# Patient Record
Sex: Male | Born: 1955
Health system: Southern US, Community
[De-identification: ages and names within clinical notes are randomized; demographics above are authoritative.]

## PROBLEM LIST (undated history)

## (undated) DIAGNOSIS — M1120 Other chondrocalcinosis, unspecified site: Secondary | ICD-10-CM

## (undated) DIAGNOSIS — R0600 Dyspnea, unspecified: Secondary | ICD-10-CM

## (undated) DIAGNOSIS — I4891 Unspecified atrial fibrillation: Secondary | ICD-10-CM

## (undated) DIAGNOSIS — N529 Male erectile dysfunction, unspecified: Secondary | ICD-10-CM

## (undated) DIAGNOSIS — Z85850 Personal history of malignant neoplasm of thyroid: Secondary | ICD-10-CM

## (undated) DIAGNOSIS — E039 Hypothyroidism, unspecified: Secondary | ICD-10-CM

## (undated) DIAGNOSIS — Z87442 Personal history of urinary calculi: Secondary | ICD-10-CM

## (undated) DIAGNOSIS — K219 Gastro-esophageal reflux disease without esophagitis: Secondary | ICD-10-CM

## (undated) DIAGNOSIS — E785 Hyperlipidemia, unspecified: Secondary | ICD-10-CM

## (undated) DIAGNOSIS — C73 Malignant neoplasm of thyroid gland: Secondary | ICD-10-CM

## (undated) DIAGNOSIS — I639 Cerebral infarction, unspecified: Secondary | ICD-10-CM

## (undated) DIAGNOSIS — R519 Headache, unspecified: Secondary | ICD-10-CM

## (undated) HISTORY — PX: JOINT REPLACEMENT: SHX530

## (undated) HISTORY — PX: THYROIDECTOMY: SHX17

## (undated) HISTORY — DX: Hyperlipidemia, unspecified: E78.5

## (undated) HISTORY — DX: Unspecified atrial fibrillation: I48.91

## (undated) HISTORY — DX: Personal history of malignant neoplasm of thyroid: Z85.850

## (undated) HISTORY — DX: Hypothyroidism, unspecified: E03.9

## (undated) HISTORY — PX: KNEE SURGERY: SHX244

## (undated) HISTORY — DX: Cerebral infarction, unspecified: I63.9

## (undated) HISTORY — DX: Male erectile dysfunction, unspecified: N52.9

## (undated) HISTORY — DX: Other chondrocalcinosis, unspecified site: M11.20

---

## 2001-01-29 DIAGNOSIS — I639 Cerebral infarction, unspecified: Secondary | ICD-10-CM

## 2001-01-29 HISTORY — DX: Cerebral infarction, unspecified: I63.9

## 2001-02-14 ENCOUNTER — Inpatient Hospital Stay (HOSPITAL_COMMUNITY)
Admission: RE | Admit: 2001-02-14 | Discharge: 2001-03-07 | Payer: Self-pay | Admitting: Physical Medicine & Rehabilitation

## 2001-06-26 ENCOUNTER — Ambulatory Visit (HOSPITAL_COMMUNITY): Admission: RE | Admit: 2001-06-26 | Discharge: 2001-06-26 | Payer: Self-pay | Admitting: Cardiology

## 2001-09-17 ENCOUNTER — Encounter: Payer: Self-pay | Admitting: Cardiovascular Disease

## 2001-09-17 ENCOUNTER — Inpatient Hospital Stay (HOSPITAL_COMMUNITY): Admission: AD | Admit: 2001-09-17 | Discharge: 2001-09-24 | Payer: Self-pay | Admitting: Cardiovascular Disease

## 2001-09-22 HISTORY — PX: CARDIAC CATHETERIZATION: SHX172

## 2002-03-05 ENCOUNTER — Ambulatory Visit (HOSPITAL_COMMUNITY): Admission: RE | Admit: 2002-03-05 | Discharge: 2002-03-05 | Payer: Self-pay | Admitting: Cardiology

## 2002-06-12 ENCOUNTER — Ambulatory Visit (HOSPITAL_COMMUNITY): Admission: RE | Admit: 2002-06-12 | Discharge: 2002-06-12 | Payer: Self-pay | Admitting: Internal Medicine

## 2002-08-13 ENCOUNTER — Encounter
Admission: RE | Admit: 2002-08-13 | Discharge: 2002-11-11 | Payer: Self-pay | Admitting: Physical Medicine & Rehabilitation

## 2003-03-09 ENCOUNTER — Inpatient Hospital Stay (HOSPITAL_COMMUNITY): Admission: AD | Admit: 2003-03-09 | Discharge: 2003-03-13 | Payer: Self-pay | Admitting: Cardiovascular Disease

## 2005-03-09 ENCOUNTER — Ambulatory Visit: Payer: Self-pay | Admitting: Gastroenterology

## 2005-08-21 ENCOUNTER — Ambulatory Visit: Payer: Self-pay | Admitting: Family Medicine

## 2005-08-24 ENCOUNTER — Encounter: Admission: RE | Admit: 2005-08-24 | Discharge: 2005-08-24 | Payer: Self-pay | Admitting: Family Medicine

## 2006-03-29 ENCOUNTER — Ambulatory Visit: Payer: Self-pay | Admitting: Family Medicine

## 2006-04-12 ENCOUNTER — Ambulatory Visit: Payer: Self-pay | Admitting: Family Medicine

## 2006-04-26 ENCOUNTER — Ambulatory Visit: Payer: Self-pay | Admitting: Family Medicine

## 2006-05-15 ENCOUNTER — Ambulatory Visit: Payer: Self-pay | Admitting: Gastroenterology

## 2006-05-31 ENCOUNTER — Ambulatory Visit: Payer: Self-pay | Admitting: Gastroenterology

## 2006-05-31 ENCOUNTER — Ambulatory Visit (HOSPITAL_COMMUNITY): Admission: RE | Admit: 2006-05-31 | Discharge: 2006-05-31 | Payer: Self-pay | Admitting: Gastroenterology

## 2006-06-26 ENCOUNTER — Ambulatory Visit: Payer: Self-pay | Admitting: Gastroenterology

## 2007-01-03 ENCOUNTER — Ambulatory Visit: Payer: Self-pay | Admitting: Family Medicine

## 2007-07-24 ENCOUNTER — Ambulatory Visit: Payer: Self-pay | Admitting: Family Medicine

## 2007-12-08 ENCOUNTER — Ambulatory Visit: Payer: Self-pay | Admitting: Family Medicine

## 2008-03-24 ENCOUNTER — Ambulatory Visit: Payer: Self-pay | Admitting: Family Medicine

## 2008-05-10 ENCOUNTER — Ambulatory Visit: Payer: Self-pay | Admitting: Family Medicine

## 2008-07-05 ENCOUNTER — Ambulatory Visit: Payer: Self-pay | Admitting: Occupational Medicine

## 2008-07-05 DIAGNOSIS — I10 Essential (primary) hypertension: Secondary | ICD-10-CM

## 2008-07-05 DIAGNOSIS — L559 Sunburn, unspecified: Secondary | ICD-10-CM

## 2008-11-14 ENCOUNTER — Emergency Department (HOSPITAL_BASED_OUTPATIENT_CLINIC_OR_DEPARTMENT_OTHER): Admission: EM | Admit: 2008-11-14 | Discharge: 2008-11-14 | Payer: Self-pay | Admitting: Emergency Medicine

## 2008-11-14 ENCOUNTER — Ambulatory Visit: Payer: Self-pay | Admitting: Radiology

## 2008-12-08 ENCOUNTER — Ambulatory Visit: Payer: Self-pay | Admitting: Family Medicine

## 2009-07-25 ENCOUNTER — Ambulatory Visit: Payer: Self-pay | Admitting: Emergency Medicine

## 2009-07-25 DIAGNOSIS — S91009A Unspecified open wound, unspecified ankle, initial encounter: Secondary | ICD-10-CM

## 2009-07-25 DIAGNOSIS — S81009A Unspecified open wound, unspecified knee, initial encounter: Secondary | ICD-10-CM

## 2009-07-25 DIAGNOSIS — S81809A Unspecified open wound, unspecified lower leg, initial encounter: Secondary | ICD-10-CM | POA: Insufficient documentation

## 2010-01-18 ENCOUNTER — Ambulatory Visit: Payer: Self-pay | Admitting: Family Medicine

## 2010-02-28 NOTE — Assessment & Plan Note (Signed)
Summary: INJURY TO L LEG/WB   Vital Signs:  Patient Profile:   55 Years Old Male CC:      Larey Seat off a ladder this morning pain in left arm, abrasion to lower left leg Height:     62 inches Weight:      170 pounds O2 treatment:    Room Air Temp:     96.9 degrees F oral Pulse rhythm:   regular Resp:     16 per minute BP sitting:   103 / 67  (right arm) Cuff size:   large  Vitals Entered By: Emilio Math (July 25, 2009 8:08 AM)                  Current Allergies (reviewed today): ! PENICILLINHistory of Present Illness History from: patient & wife Chief Complaint: Larey Seat off a ladder this morning pain in left arm, abrasion to lower left leg History of Present Illness: 55yo WM with a history a stroke in his 40's and on Coumadin fell off a ladder at his house 1 hour ago.  He caught himself with both legs and both arms and was bleeding.  By the time he got to the office, the bleeding has mostly stopped but the left leg still oozing.  Painful abrasions on both legs and both arms.  No trauma to head or abdomen.  No CP, SOB, bruising.  His wife is here and speaks for him because his speech was affected by the stroke.  She states that he is baseline mentality.  He is able to express some words and actions and states that otherwise he is having no symptoms.  Pressure helped the bleeding stop.  Last Td in the past 5 years  REVIEW OF SYSTEMS Constitutional Symptoms      Denies fever, chills, night sweats, weight loss, weight gain, and fatigue.  Eyes       Denies change in vision, eye pain, eye discharge, glasses, contact lenses, and eye surgery. Ear/Nose/Throat/Mouth       Denies hearing loss/aids, change in hearing, ear pain, ear discharge, dizziness, frequent runny nose, frequent nose bleeds, sinus problems, sore throat, hoarseness, and tooth pain or bleeding.  Respiratory       Denies dry cough, productive cough, wheezing, shortness of breath, asthma, bronchitis, and emphysema/COPD.    Cardiovascular       Denies murmurs, chest pain, and tires easily with exhertion.    Gastrointestinal       Denies stomach pain, nausea/vomiting, diarrhea, constipation, blood in bowel movements, and indigestion. Genitourniary       Denies painful urination, kidney stones, and loss of urinary control. Neurological       Denies paralysis, seizures, and fainting/blackouts. Musculoskeletal       Complains of muscle pain, joint pain, joint stiffness, decreased range of motion, and redness.      Denies swelling, muscle weakness, and gout.      Comments: abrasion to lower left leg Skin       Denies bruising, unusual mles/lumps or sores, and hair/skin or nail changes.  Psych       Denies mood changes, temper/anger issues, anxiety/stress, speech problems, depression, and sleep problems.  Past History:  Social History: Last updated: 07/05/2008 Married Alcohol use-no Drug use-no Regular exercise-yes Chew Tobacco  Past Medical History: Reviewed history from 07/05/2008 and no changes required. Hypertension Blood Thinner Thyroid Cancer Stroke  Past Surgical History: Reviewed history from 07/05/2008 and no changes required. Knee Surgeries Thyroid Cancer  Family  History: Reviewed history from 07/05/2008 and no changes required. Family History Diabetes 1st degree relative Family History Hypertension Family History of Stroke F 1st degree relative <60  Social History: Reviewed history from 07/05/2008 and no changes required. Married Alcohol use-no Drug use-no Regular exercise-yes Chew Tobacco Physical Exam General appearance: well developed, well nourished, no acute distress Head: normocephalic, atraumatic Eyes: conjunctivae and lids normal Pupils: equal, round, reactive to light Neck: neck supple,  trachea midline, no masses Chest/Lungs: no rales, wheezes, or rhonchi bilateral, breath sounds equal without effort Heart: regular rate and  rhythm, no murmur Abdomen: soft,  non-tender without obvious organomegaly.  No bruising, no rebound or signs of intraabdominal bleeding Extremities: normal extremities, FROM, full strength Neurological: Speech is slurred (baseline per wife), right-sided weakness (baseline), able to walk with a limp, CN2-12 intact and no signs of intracranial bleeding Back: no tenderness over musculature Skin: Scattered abrasions and scratches on both shins and forearms, Left shin has 2cm laceration that goes 1cm deep to muscle, no signs of infection, no foreign bodies, very slight oozing of blood MSE: oriented to time, place, and person Assessment New Problems: WOUND, OPEN, LEG, WITHOUT COMPLICATION (ICD-891.0)   The patient and/or caregiver has been counseled thoroughly with regard to medications prescribed including dosage, schedule, interactions, rationale for use, and possible side effects and they verbalize understanding.  Diagnoses and expected course of recovery discussed and will return if not improved as expected or if the condition worsens. Patient and/or caregiver verbalized understanding.   PROCEDURE:  Suture Site: Left shin Size: 2cm Number of Lacerations: 1 Procedure: Discussed benefits and risks of procedure and verbal consent obtained.  Using sterile technique and local 1% lidocaine without epinephrine, cleansed wound with betadine followed by copious lavage with normal saline.  Wound carefully inspected for debris and foreign bodies; none found.  Wound closed with #3 , 4-0 interrupted nylon sutures.  Bacitracin and non-stick sterile dressing applied.  Wound precautions explained to patient.  Patient Instructions: 1)  Follow up in 10 days for suture removal 2)  Keep wound clean & dry for 48 hours 3)  After then, may shower but do not submerge in water until stitches are removed 4)  Tylenol only for pain 5)  If continued bleeding, return to clinic or speak to PCP 6)  Since you are on Coumadin, monitor for the first 48 hours  for changes of mental status, confusion, headache, abdominal pain. If any of these symptoms, call EMS or go immediately to the ER. 7)  Inform your PCP of what happened and caution with ladders, etc due to a history of stroke  Orders Added: 1)  New Patient Level IV [99204] 2)  Repair Superficial Wound(s) 2.5cm or <(scalp,neck,axillae,ext gent,trunk,extre) [12001]

## 2010-06-16 NOTE — Assessment & Plan Note (Signed)
[facility]                         GASTROENTEROLOGY OFFICE NOTE   NAME:Eric Ross, Eric Ross                    MRN:          [id number]  DATE:05/15/2006                            DOB:          July 26, 1955    Eric Ross has returned to set up colonoscopy.  He was seen over a  year ago for similar considerations and he decided not to proceed with  the procedure.  Dr. Allyson Sabal apparently felt that he should be on Lovenox  until the time of the procedure because of a history of a CVA and  cardiomyopathy.  Since his last visit, he has had no GI complaints.   PHYSICAL EXAMINATION:  VITAL SIGNS:  Pulse 56.  Blood pressure 110/60.  Weight 167.   IMPRESSION:  1. Functional constipation.  2. History of cardiomyopathy and paroxysmal atrial fibrillation- on      Coumadin.   RECOMMENDATIONS:  Proceed with screening colonoscopy.     Barbette Hair. Arlyce Dice, MD,FACG  Electronically Signed    RDK/MedQ  DD: 05/15/2006  DT: 05/15/2006  Job #: [id number]   cc:   Sharlot Gowda, M.D.  Nanetta Batty, M.D.

## 2010-06-16 NOTE — Op Note (Signed)
[place]  Patient:    Eric Ross, Eric Ross Visit Number: [id number] MRN: [id number]          Service Type: CAT Location: [facility] 2867 01 Attending Physician:  Norman Clay Dictated by:   Delrae Rend, M.D. Admit Date:  06/26/2001 Discharge Date: 06/26/2001   CC:         Ronnald Nian, M.D.  Runell Gess, M.D.   Operative Report  PROCEDURE PERFORMED:  Transesophageal echocardiogram.  INDICATION:  The patient is a 55 year old male with history of chronic atrial fibrillation and history of right-sided hemiparesis secondary to CVA in January of 2003, and with known history of left ventricular systolic dysfunction presents for evaluation of his atrial fibrillation. A transesophageal echocardiogram is being performed to evaluate for left atrial appendage clot and for possible for electrical cardioversion.  TECHNIQUE:  Under mild sedation using Versed and Demerol and using local anesthetic spray, a [employer] OmniPlane probe was easily introduced into the esophagus and transesophageal echocardiogram was performed. Adequate visualization was obtained of the left atrial appendage and also the left atrium. The patient tolerated the procedure well.  Left atrium:  The left atrium shows some mild left atrial enlargement. The left atrial appendage is well visualized. There is no evidence of left atrial appendage clot. The velocities in the left atrial appendage was greater than 20 cm per second.  Left ventricle:  The left ventricle shows moderate generalized hypokinesis. The ejection fraction is estimated around 35%.  Right atrium:  The right atrium is normal.  Right ventricle:  The right ventricle is normal.  Pericardium:  The pericardium is normal.  Mitral valve:  The mitral valve is normal. There is minimal mitral valve regurgitation.  Tricuspid valve:  The tricuspid valve is normal. There is minimal  tricuspid valve regurgitation.  Aortic valve:  The aortic valve is normal. There is no evidence of aortic stenosis or regurgitation.  Pulmonary valve:  The pulmonary valve is normal. There is trivial pulmonary regurgitation.  Intraatrial septum:  The intraatrial septum is intact. There is no evidence of PFO either by color Doppler or by double contrast injection.  Aorta:  The ascending arch and the thoracic aorta are normal. There is no complex plaque visualized.  FINAL IMPRESSION: 1. Moderate to markedly reduced left ventricular systolic dysfunction,    ejection fraction about 35% with global hypokinesis. No evidence of left    left ventricular apical thrombus. 2. Left atrium shows mild left enlargement with left atrial appendage showing    no evidence of left atrial appendage clot. 3. Minimal tricuspid, minimal mitral valve regurgitation. Dictated by:   Delrae Rend, M.D. Attending Physician:  Norman Clay DD:  06/26/01 TD:  06/27/01 Job: [id number]

## 2010-06-16 NOTE — Cardiovascular Report (Signed)
NAME:  SLOANE, JUNKIN                       ACCOUNT NO.:  [id number]   MEDICAL RECORD NO.:  [id number]                   PATIENT TYPE:  INP   LOCATION:  3727                                 FACILITY:  [facility]   PHYSICIAN:  Darlin Priestly, M.D.             DATE OF BIRTH:  1955-11-06   DATE OF PROCEDURE:  09/22/2001  DATE OF DISCHARGE:                              CARDIAC CATHETERIZATION   PROCEDURES:  1. Left heart catheterization.  2. Coronary angiography.  3. Left ventriculogram.   COMPLICATIONS:  None.   INDICATIONS:  The patient is a 55 year old male, patient of Dr. Nanetta Batty and Dr. Sharlot Gowda with a history of atrial fibrillation, history of  LV dysfunction with EF approximately 35% by echocardiogram.  The patient  recently complained of chest pain and is now referred for cardiac  catheterization while off of Coumadin to evaluate his coronary status and  then TEE cardioversion.   DESCRIPTION OF PROCEDURE:  After given informed written consent, the patient  was brought to the cardiac catheterization lab where his right and left  groins were shaved, prepped, and draped in the usual sterile fashion.  ECG  monitoring was established.  Using modified Seldinger technique, a #6 French  arterial access was inserted in the right femoral artery.  A 6 French  diagnostic catheter was then used to perform diagnostic angiography. This  reveals a large left main with no significant disease.   The LAD is a large vessel which coursed to the apex and gave rise to two  small diagonal branches. The LAD has no significant disease. The first  diagonal is a medium sized vessel which bifurcates distally and has no  significant disease. The second diagonal is a small vessel with no  significant disease.   Left circumflex is a large vessel which coursed in the AV groove and gave  rise to two obtuse marginal branches.  The AV groove circumflex has no  significant disease. The first  OM is a medium sized vessel which bifurcates  distally and has no significant disease. The second OM is a small vessel  which bifurcates in the mid section and has no significant disease. There is  noted to be an AV malformation with the AV groove circumflex.   RCA is a medium sized vessel which is dominant and gives rise to both the  PDA as well as the PLA.  There is no significant disease in the RCA, PDA, or posterolateral branch.   LEFT VENTRICULOGRAM:  Left ventriculogram reveals a moderately to severely  depressed EF at 25-30% with global hypokinesis.   HEMODYNAMICS:  Systemic arterial pressure 127/75, LV systemic pressure  125/8, LVEDP of 11.    CONCLUSION:  1. Essentially normal coronary arteries.  2. Arteriovenous malformation noted in the distal arteriovenous groove     circumflex distribution  3. Moderate to severely depressed left ventricular systolic function with  wall motion abnormalities as noted above.                                                 Darlin Priestly, M.D.    RHM/MEDQ  D:  09/22/2001  T:  09/23/2001  Job:  [id number]   cc:   Runell Gess, M.D.   Ronnald Nian, M.D.

## 2010-06-16 NOTE — Discharge Summary (Signed)
NAME:  Eric Ross, Eric Ross                       ACCOUNT NO.:  [id number]   MEDICAL RECORD NO.:  [id number]                   PATIENT TYPE:  INP   LOCATION:  3701                                 FACILITY:  [facility]   PHYSICIAN:  Nanetta Batty, M.D.                DATE OF BIRTH:  27-Apr-1955   DATE OF ADMISSION:  03/09/2003  DATE OF DISCHARGE:  03/13/2003                                 DISCHARGE SUMMARY   DISCHARGE DIAGNOSES:  1. Paroxysmal atrial fibrillation, TEE cardioversion on Tikosyn on this     admission.  2. Coumadin therapy.  3. History of a cerebrovascular accident in the past.  4. Cardiomyopathy with an ejection fraction of 35 to 40% by transesophageal     echocardiography.  5. Normal coronaries.   HOSPITAL COURSE:  [name] is a 55 year old male followed by Dr. Susann Givens  and Dr. Allyson Sabal.  He has apparently failed amiodarone in the past.  He had an  atrial flutter ablation by Dr. Clide Cliff in May of 2004.  He unfortunately had a  significant CVA in January of 2003 and is aphasic.  He was referred to Dr.  Macon Large in [place] for possible atrial fibrillation ablation.  Dr. Macon Large  suggested trying a different antiarrhythmic and the patient is admitted now  for a trial of Tikosyn.  The patient was admitted on March 09, 2003 and  started on Tikosyn which actually was not started until March 10, 2003.  He had been off amiodarone for some time.  The patient underwent TEE  cardioversion on March 12, 2003 by Dr. Jenne Campus.  As noted, his LV  function was essentially normal.  He is discharged on March 13, 2003 in  sinus rhythm.   DISCHARGE MEDICATIONS:  1. Coumadin 5 mg daily.  2. Synthroid 0.112 mg daily.  3. Lanoxin 0.125 mg daily.  4. Zocor 20 mg daily.  5. Tikosyn 500 mg b.i.d.  6. Altace 2.5 mg daily.  7. Coreg 12.5 mg b.i.d.   LABORATORY DATA:  EKG on admission shows atrial fibrillation, EKG after  cardioversion on March 12, 2003 shows sinus rhythm with a QTC of  462.  White count 6000, hemoglobin 14.6, hematocrit 43.1, platelets  152,000.  His INR on admission was 2.9, INR on March 12, 2003 was 2.2.  Sodium 138, potassium 4.1, BUN 10, creatinine 1.0.  Liver function tests  were essentially normal.  TSH 1.7.  Digoxin level 0.4.   DISPOSITION:  The patient was discharged in stable condition and will follow  up with Dr. Allyson Sabal.      Abelino Derrick, P.A.                      Nanetta Batty, M.D.    Lenard Lance  D:  04/24/2003  T:  04/24/2003  Job:  [id number]   cc:   Sharlot Gowda, M.D.  [address]  [place], [place] [address]  Fax: [phone]

## 2010-06-16 NOTE — Op Note (Signed)
NAME:  Eric Ross, Eric Ross                       ACCOUNT NO.:  [id number]   MEDICAL RECORD NO.:  [id number]                   PATIENT TYPE:  OIB   LOCATION:  2861                                 FACILITY:  [facility]   PHYSICIAN:  Cristy Hilts. Jacinto Halim, M.D.                  DATE OF BIRTH:  [birth year]   DATE OF PROCEDURE:  DATE OF DISCHARGE:                                 OPERATIVE REPORT   PROCEDURE PERFORMED:  Transesophageal echocardiogram.   INDICATIONS FOR PROCEDURE:  Mr. Wakefield is a 55 year old gentleman with a  history of non-ischemic cardiomyopathy, history of paroxysmal atrial  fibrillation, who has now recurrently gone back into atrial fibrillation.  He was brought to the TE suite for TE guided apical cardioversion. A TE has  been performed to evaluate for atrial appendage.   DATA:  Left atrium:  Left atrium shows moderate to large marked left atrial  enlargement.   Left ventricle:  The left ventricle shows mild left ventricular dilatation.  There is mild to moderate generalized hypokinesis.  There is anterior antral  septal hypokinesis. The ejection fraction was estimated at 40%.   Right atrium:  The right atrium is normal.   Right ventricle:  The right ventricle is normal.   Pericardium:  The pericardium is normal.   Mitral valve:  The mitral valve is normal.  There is trivial mitral valve  regurgitation.   __________ is normal.  There is trivial tricuspid regurgitation.   __________ is normal.   Left atrial appendage:  Left atrial appendage is very well visualized. The  velocity is greater than 80 cm per second.  There is no evidence of left  atrial appendage clot.   Intra-atrial septum:  The intra-atrial septum is intact.  No evidence of  impaired flow by color Doppler or by double contrast injection.   IMPRESSION:  1. Moderate decrease in left ventricular systolic function, ejection     fraction around 40% with generalized hypokinesis, anterior hypokinesis.  2. No  evidence of left atrial appendage clot.   RECOMMENDATIONS:  Will proceed with electrical cardioversion.   ELECTRICAL CARDIOVERSION:  Technique:  Under mild sedation, with the help of  the Department of Anesthesia, using 25 mg of intravenous Pentothal, deep  sedation was achieved and synchronized direct current  cardioversion was done using a biphasic defibrillator.  50 joules of  electrical activity was utilized for cardioversion and patient converted  from atrial flutter to sinus bradycardia.  He remained hemodynamically  stable and tolerated the procedure well.                                               Cristy Hilts. Jacinto Halim, M.D.    Pilar Plate  D:  03/05/2002  T:  03/06/2002  Job:  [id number]  cc:   Sharlot Gowda, M.D.  [place] [address] [place], [place] [address]  Fax: [phone]

## 2010-06-16 NOTE — Cardiovascular Report (Signed)
NAME:  Eric Ross, Eric Ross                       ACCOUNT NO.:  [id number]   MEDICAL RECORD NO.:  [id number]                   PATIENT TYPE:  OIB   LOCATION:  2028                                 FACILITY:  [facility]   PHYSICIAN:  Duke Salvia, M.D.               DATE OF BIRTH:  April 25, 1955   DATE OF PROCEDURE:  06/12/2002  DATE OF DISCHARGE:  06/12/2002                              CARDIAC CATHETERIZATION   PRE PROCEDURE DIAGNOSIS:  Atrial flutter.   POSTPROCEDURE DIAGNOSIS:  Counterclockwise/clockwise flutter/other atypical  flutters and atrial fibrillation.   PROCEDURES PERFORMED:  1. Invasive electrophysiological study.  2. Arrhythmia mapping.  3. Radiofrequency catheter ablation.   CARDIOLOGIST:  Duke Salvia, M.D.   DESCRIPTION OF PROCEDURE:  Following the obtainment of informed consent the  patient was brought to the electrophysiology laboratory and placed on the  fluoroscopic table in the supine position.  After routine prep and drape the  cardiac catheterization was performed with local anesthesia and conscious  sedation.  Noninvasive blood pressure monitoring, transcutaneous oxygen  saturation monitoring and end-tidal CO2 monitoring were performed  continuously throughout the procedure.   Following the procedure the catheters were remove.  Hemostasis was obtained  and the patient was transferred to the floor in stable condition.   CATHETERS:  1. A 5-French quadripolar catheter was inserted via the left femoral vein to     the AV junction.  2. A 7-French Duodecapolar was inserted via the right femoral vein to the     tricuspid annulus.  3. A 6-French octapolar catheter was inserted via the right femoral vein to     the coronary sinus.  4. A 7-French 8 mm deflectable tip catheter was inserted via the right     femoral vein to mapping sites in the posterior septal space.   SURFACE LEADS:  Surface leads 1, aVF, and V1 were monitored continuously  throughout the  procedure.   STIMULATION PROTOCOL:  Following insertion of the catheters a stimulation  protocol included incremental intra-atrial pacing and  incremental  ventricular pacing.  Single atrial extrastimuli at a paced cycle length of  600 msec.   RESULTS:   SURFACE ELECTROCARDIOGRAM AND BASIC INTERVALS:  Rhythm is sinus initial and  sinus final.  Cycle length 1448 msec initial and 1067 msec final.  P-R interval is 184 msec initial and 178 msec final.  QRS duration is 104 msec initial and 109 msec final.  Q-T intervals 508 msec initial and final.  P-wave duration is 131 msec initial and 133 msec final.  Bundle branch block was absent and absent.  Preexcitation was absent and absent.   A-H interval is 104 secondary initial and 97 msec final.  H-V interval is 36 msec initial and 43 msec final.  His bundle branch was N/M initial and 12 msec final.   A-V NODAL FUNCTION:  The A-V Wenckebach cycle length was 550 msec in the  absence/__________.  V-A conduction was dissociated.  A-V nodal conduction was continuous.   ACCESSORY PATHWAY FUNCTION:  No evidence of an accessory pathway was  identified.   Arrhythmia was induced.  Two different isthmus-dependent flutters were  identified; one clockwise and one counterclockwise.  Both with a cycle  length of about 310 msec, which is 50 msec longer than his clinical  tachycardia.   MAPPING:  Mapping demonstrated post pacing intervals of tachycardic cycle  length of +10-20 msec from the isthmus and greater than 80 msec from the  left side of the heart.   Alternating atrial activation sequences were also seen consistent with  atrial fibrillation and breakdown of the primary circuits.  In addition, the  atrial flutter behavior is quite unusual in that it would start and stop  spontaneously.   RADIOFREQUENCY ABLATION:  Radiofrequency energy:  A total of 28 minutes 8  seconds of radio wave energy was applied at sites between the tricuspid   annulus in the IVC.  Initial application was placed at about 6 o'clock, a  line was drawn at 5 o'clock and ultimately a line at 7 o'clock was  successful in interrupting transisthmus conduction.   FLUOROSCOPY TIME:  A total of 20 minutes of fluoroscopy time was utilized at  15 frames per second.   IMPRESSION:  1. Normal sinus function apart from bradycardia.  2. Abnormal atrial function manifested by:     a. Counterclockwise and clockwise flutter, and other atrial arrhythmias.  3. Normal A-V nodal function.  4. Normal His Purkinje system function.  5. No accessory pathways.  6. Normal ventricular response to programmed stimulation.   SUMMARY AND CONCLUSION:  The results of electrophysiological testing  identified isthmus-dependent clockwise and counterclockwise flutters.  The  substrate of which was eliminated by radio wave energy applied between the  tricuspid annulus and the inferior vena cava resulting in a bidirectional  isthmus block.  However, there were other atrial arrhythmias seen at mid and  related to either breakdown of the primary arrhythmia or related to rhythm  the patient initially presented with, which was atrial fibrillation.  Trigger ablation might ultimately be needed for the patient for control of  the patient's symptoms as he has failed amiodarone from a triggering point  of view to date; although, the amiodarone may have affected his substrate  such that the atrial flutter ablation may have a significant impact on  decreasing his arrhythmia burden.    RECOMMENDATIONS:  1. Observe during the day.  2. Discharge and maintain his Coumadin and his amiodarone as previously     scheduled.  3. We will see again as needed per Dr. Allyson Sabal.                                                 Duke Salvia, M.D.    SCK/MEDQ  D:  06/12/2002  T:  06/13/2002  Job:  [id number]   cc:   Nanetta Batty, M.D. [address], [address]  [place]  [place] [address]  Fax:  [phone]   Electrophysiology Laboratory

## 2010-06-16 NOTE — Cardiovascular Report (Signed)
[facility]  Patient:    Eric Ross, Eric Ross Visit Number: [id number] MRN: [id number]          Service Type: CAT Location: [facility] 2867 01 Attending Physician:  Norman Clay Dictated by:   Delrae Rend, M.D. Proc. Date: 06/26/01 Admit Date:  06/26/2001 Discharge Date: 06/26/2001   CC:         Ronnald Nian, M.D.  Runell Gess, M.D.   Cardiac Catheterization  PROCEDURE:  Direct current cardioversion.  CARDIOLOGIST:  Delrae Rend, M.D.  INDICATIONS:  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  He underwent transesophageal echocardiogram for evidence of left atrial appendage clot.  As he had no evidence of left atrial appendage clot, a direct current cardioversion was attempted to convert atrial fibrillation to sinus rhythm. Also, the patient has severe left ventricular systolic dysfunction.  This would definitely be beneficial for his cardiac output.  TECHNIQUE:  With the help of the department of anesthesia under deep sedation with etomidate and also pentothal IV injection, direct current cardioversion was attempted at 200 joules.  The patient converted to sinus rhythm for a brief period and reverted back to atrial fibrillation.  A second attempt at 300 joules performed x 2.  The patient with the second attempt did not convert to sinus rhythm, remained in atrial fibrillation, but with the third attempt, he did convert to sinus rhythm for a very brief period and then reverted back to atrial fibrillation.  IMPRESSION:  Chronic atrial fibrillation with unsuccessful direct current cardioversion.  The patient very transiently converted to sinus rhythm; however, he reverted back to atrial fibrillation.  RECOMMENDATIONS:  As patient easily reverts back into atrial fibrillation, would recommend antiarrhythmic therapy trial and then repeat  attempt at electrical cardioversion in four to six weeks.  There is no evidence of left atrial appendage clot; hence, direct current cardioversion can be attempted without a TEE as long as his INR is with the therapeutic range.  The patient will follow up with Dr. Nanetta Batty. Dictated by:   Delrae Rend, M.D. Attending Physician:  Norman Clay DD:  06/26/01 TD:  06/27/01 Job: [id number]

## 2010-06-16 NOTE — Cardiovascular Report (Signed)
NAME:  Eric Ross, Eric Ross                       ACCOUNT NO.:  [id number]   MEDICAL RECORD NO.:  [id number]                   PATIENT TYPE:  INP   LOCATION:  3701                                 FACILITY:  [facility]   PHYSICIAN:  Darlin Priestly, M.D.             DATE OF BIRTH:  September 25, 1955   DATE OF PROCEDURE:  03/12/2003  DATE OF DISCHARGE:                              CARDIAC CATHETERIZATION   PROCEDURE:  Transesophageal echocardiogram guided DC cardioversion.   CARDIOLOGIST:  Darlin Priestly, M.D.   COMPLICATIONS:  None.   INDICATIONS:  [name] is a 55 year old male, patient of Dr. Sharlot Gowda and Dr. Nanetta Batty, with a history of recurrent atrial  fibrillation and history of CVA with expressive aphasia and right-sided  weakness who has been on multiple antiarrhythmic medications.  However, he  continues to have episodes of a-fib.  He was recently admitted for Tikosyn  loading and is now referred for repeat DC cardioversion on dofetilide.   DESCRIPTION OF PROCEDURE:  After obtaining informed written consent the  patient was brought to the endoscopy suite in the fasting state.  He  underwent a successful and uncomplicated transesophageal echocardiogram.  The LV appeared to be normal size with moderately depressed left ventricular  systolic function, estimated to probably 35-40%.  There appears to be little  hypokinesis.  Structurally normal aortic valve.  Structurally normal mitral  valve with trivial mitral regurg.  Structurally normal tricuspid valve with  a trivial tricuspid regurg.  No intracardiac mass with thrombi noted.  No  evidence of patent PFO by color or contrast echo.  Normal descending  thoracic aorta.   Following the TEE the patient then received 100 mg of Pentothal by  anesthesia.  The patient then underwent successful and uncomplicated DC  cardioversion at 150 joules with the biphasic defibrillator from atrial  fibrillation back to sinus bradycardia.   He remained hemodynamically stable.   The patient awoke in satisfactory condition and was transferred to the  recovery room.   CONCLUSION:  1. Successful transesophageal echocardiogram with findings noted above.  2. Successful cardioversion from atrial fibrillation to sinus bradycardia.                                               Darlin Priestly, M.D.    RHM/MEDQ  D:  03/12/2003  T:  03/13/2003  Job:  [id number]   cc:   Sharlot Gowda, M.D.  [address]  [place], [place] [address]  Fax: [phone]   Nanetta Batty, M.D.  Fax: [phone]

## 2010-06-16 NOTE — Discharge Summary (Signed)
   NAME:  Eric Ross, Eric Ross                       ACCOUNT NO.:  [id number]   MEDICAL RECORD NO.:  [id number]                   PATIENT TYPE:  OIB   LOCATION:  2028                                 FACILITY:  [facility]   PHYSICIAN:  Duke Salvia, M.D.               DATE OF BIRTH:  1955-05-14   DATE OF ADMISSION:  06/12/2002  DATE OF DISCHARGE:  06/12/2002                                 DISCHARGE SUMMARY   PRIMARY DIAGNOSIS:  Atrial flutter.   SECONDARY DIAGNOSIS:  1. History of atrial fibrillation on Amiodarone therapy.  2. Cardiomyopathy.  3. Prior cerebrovascular accident and disabled secondary to cerebrovascular     accident.   HISTORY OF PRESENT ILLNESS:  This is a 55 year old gentleman who is aphasic,  who presented with a stroke in the context of atrial arrhythmia in January  2003 and left ventricular dysfunction.  He has made significant recovery,  but has persistent aphasia. He is now fairly stable.  He has undergone  cardioversion on a couple of occasions after the second of which he was  apparently put on Amiodarone.  He continued to have breakthrough after his  most recent cardioversion.  The patient was admitted for an atrial flutter  ablation.   The patient underwent successful atrial flutter ablation.  The patient  underwent successful ablation of transisthmus conduction showing conduction  consistent with atrial flutter, also a number of multiple other atrial  arrhythmias. The patient was to continue on continue on Coumadin and  Amiodarone therapy. His heart rate post procedure was 46.  Blood pressure 90-  100/50's.  He was discharged to home in stable condition on all his previous  medication. He was to take Tylenol 1-2 tablets every 4-6 hours as needed. No  heavy lifting or strenuous activity for four days.   DIET:  Low fat, low salt, low cholesterol diet.   He is to call if he develops a lump or any drainage in his groin. An  appointment was scheduled with Dr.  Graciela Husbands for June 25 at 12 noon.   DISCHARGE MEDICATIONS:  He was discharged on Coumadin 5 mg nightly, Coreg  6.25 b.i.d.,  Digoxin 0.125 daily,  Synthroid 150 mcg daily, Altace 2.5  daily, Zocor 20 nightly and Cordarone 200 mg daily.      Chinita Pester, C.R.N.P. [facility]                 Duke Salvia, M.D.    DS/MEDQ  D:  06/12/2002  T:  06/13/2002  Job:  [id number]   cc:   Nanetta Batty, M.D.  [address], [address]  [place]  [place] [address]  Fax: [phone]

## 2010-06-16 NOTE — H&P (Signed)
NAME:  BENJI, POYNTER                       ACCOUNT NO.:  [id number]   MEDICAL RECORD NO.:  [id number]                   PATIENT TYPE:  INP   LOCATION:  3701                                 FACILITY:  [facility]   PHYSICIAN:  Nanetta Batty, M.D.                DATE OF BIRTH:  12/02/1955   DATE OF ADMISSION:  03/09/2003  DATE OF DISCHARGE:                                HISTORY & PHYSICAL   HISTORY OF PRESENT ILLNESS:  Mr. Marczak is a 55 year old male followed by  Dr. Susann Givens and Dr. Allyson Sabal with a history of recurrent atrial fibrillation.  He apparently has failed amiodarone in the past.  He had an atrial flutter  ablation by Dr. Graciela Husbands in May 2004.  He has had a prior CVA in January 2003.  He is on chronic Coumadin therapy.  Dr. Allyson Sabal recently asked Dr. Val Riles at [facility] to evaluate  him for possible atrial flutter ablation. Dr  .Macon Large has suggested we try a second drug.  His choice was dofetilide.  If  the patient does not hold sinus rhythm on dofetilide, then he would see him  back for possible atrial fibrillation ablation.  Symptomatically, the  patient has done fairly well.  He denies any shortness of breath.   PAST MEDICAL HISTORY:  1. CVA in January 2003 that left him aphasic and some right upper extremity     weakness.  2. His LV function has been depressed in the past; his EF was 25 to 30% at     catheterization in August 2003, but an echocardiogram done in October     2003 had shown his EF to be 50 to 60%.  He did have normal coronaries at     catheterization in August 2003.  3. Treated hyperlipidemia.  4. History of thyroid cancer and had remote thyroid surgery in the 1980s.  5. DJD of knees and had knee surgery in the past; I believe he used to ride     [place].   CURRENT MEDICATIONS:  1. Lanoxin 0.125 mg a day.  2. Coumadin 5 mg a day.  3. Synthroid 0.125 mg a day.  4. Zocor 20 mg a day.  5. Coreg 12.5 mg 1-1/2 tablets b.i.d.  6. Altace 2.5 mg  b.i.d.   ALLERGIES:  PENICILLIN.   SOCIAL HISTORY:  He is married.  He does not smoke.  He uses alcohol  occasionally.   FAMILY HISTORY:  His father died at 52 with lung disease.  His mother died  at 43 from thyroid cancer.  He has one brother, age 25, that had a stroke;  one brother at 15 with no significant coronary artery disease or stroke  history.  He has one sister with history of lung cancer.   REVIEW OF SYSTEMS:  Essentially unremarkable except as noted above.  He  denies any orthopnea or PND or unusual dyspnea.  He does have right-sided  weakness from stroke.  Review of Systems is otherwise unremarkable.   PHYSICAL EXAMINATION:  VITAL SIGNS:  Blood pressure 96/48, heart rate 76,  respirations 16.  GENERAL:  Well-developed, well-nourished, thin male in no acute distress.  HEENT:  Normocephalic.  Extraocular movements intact.  Sclerae anicteric.  Lids and conjunctivae  within normal limits.[name]  NECK:  Without bruit and without JVD.  CHEST:  Clear to auscultation and percussion.  CARDIAC:  Irregularly irregular rhythm without murmur,rub, or gallop.  ABDOMEN:  Nontender.  No hepatosplenomegaly is appreciated.  EXTREMITIES:  Without edema.  Distal pulses are 3+/4 bilaterally.  NEUROLOGIC:  Grossly intact.  He is awake, alert, oriented, and cooperative.  He does have aphasia but responds positively or negatively to questions.   IMPRESSION:  1. Recurrent atrial fibrillation, failed on amiodarone.  2. History of cerebrovascular accident in January 2003 with aphasia, mild     right hemiparesis.  3. Question of cardiomyopathy.  Ejection fraction was 25 to 30% on     catheterization in August 2003; echocardiogram in October 2003 shows     ejection fraction to be improved at 50 to 60%.  4. Normal coronaries at catheterization August 2003.  5. Hyperlipidemia.  The patient is on Zocor.  6. Treated hypothyroidism with remote history of thyroid cancer and surgery.   PLAN:  The patient  was admitted by Dr. Allyson Sabal and will be started on Tikosyn.      Abelino Derrick, P.A.                      Nanetta Batty, M.D.    Lenard Lance  D:  03/09/2003  T:  03/09/2003  Job:  [id number]

## 2010-06-16 NOTE — Op Note (Signed)
   NAME:  DEMERE, DOTZLER                       ACCOUNT NO.:  [id number]   MEDICAL RECORD NO.:  [id number]                   PATIENT TYPE:  INP   LOCATION:  3727                                 FACILITY:  [facility]   PHYSICIAN:  Cristy Hilts. Jacinto Halim, M.D.                  DATE OF BIRTH:  Jun 24, 1955   DATE OF PROCEDURE:  09/23/2001  DATE OF DISCHARGE:                                 OPERATIVE REPORT   PROCEDURE PERFORMED:  Direct current cardioversion using biphasic  defibrillator.   INDICATIONS:  The patient is a 55 year-old gentleman with a history of  chronic atrial fibrillation and non-ischemic cardiomyopathy who comes to the  hospital for cardiac catheterization and also for electrical cardioversion.  He has initially failed his cardioversion and subsequently was placed on  Amiodarone.  Electrical cardioversion is being performed in the hope of  conversion from atrial fibrillation to sinus rhythm given his severe left  ventricular systolic dysfunction.   TECHNIQUE:  Under sedation using Pentothal with the help of anesthesia, the  patient was deeply sedated and direct current cardioversion was performed  using biphasic defibrillator under 100 joules of current.  The atrial  fibrillation converted to normal sinus rhythm with one current discharge.  The patient tolerated the procedure well and remained hemodynamically stable  pre and post procedure.   IMPRESSION:  Status post electrical cardioversion from atrial fibrillation  to normal sinus rhythm with the help of biphasic defibrillator discharged at  100 joules of current.                                               Cristy Hilts. Jacinto Halim, M.D.    Pilar Plate  D:  09/23/2001  T:  09/25/2001  Job:  [id number]
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
## 2010-06-16 NOTE — Discharge Summary (Signed)
NAME:  Eric Ross, Eric Ross                       ACCOUNT NO.:  [id number]   MEDICAL RECORD NO.:  [id number]                   PATIENT TYPE:  INP   LOCATION:  3727                                 FACILITY:  [facility]   PHYSICIAN:  Runell Gess, M.D.             DATE OF BIRTH:  March 15, 1955   DATE OF ADMISSION:  09/17/2001  DATE OF DISCHARGE:  09/24/2001                                 DISCHARGE SUMMARY   DISCHARGE DIAGNOSES:  1. Atrial fibrillation, status post direct current cardioversion     successfully converted to normal sinus rhythm.  2. Status post cardiac catheterization during this admission.  3. Left ventricular dysfunction with ejection fraction 25-30%.  4. Status post embolic cerebrovascular accident with residual aphasia in     01/2001.  5. Anticoagulation therapy.  6. Gastroesophageal reflux disease.  7. Nonischemic cardiomyopathy.   HISTORY OF PRESENT ILLNESS:  This is a 55 year old unfortunate gentleman  with a history of embolic stroke in January of 1914 with residual aphasia.  He was seen in the office by Dr. Allyson Sabal on September 16, 2001 and is post  unsuccessful transesophageal DC. He was started on Cordarone with hope to  immediately cardiovert him with antiarrhythmic agent which was unsuccessful.  He was examined by Dr. Allyson Sabal and the plan was made to admit him to the  hospital for Coumadin and heparin cross over with intent to attempt to  cardiovert him and cardiac cath.   HOSPITAL COURSE:  Hospital admission was uncomplicated.  The patient was  admitted to the Telemetry Unit and the plan was to expose him to cath when  [facility] becomes less than or equal to 1.5 and also proceed with cardioversion.  The procedure was carried out by Dr. Jenne Campus.  On September 22, 2001 the  patient underwent cardiac cath.  Ejection fraction was 25-30% and cath  revealed an essentially normal coronary angiography.   Post cath the patient was exposed to cardioversion which was carried out  successfully and he converted to sinus rhythm and maintained the sinus  rhythm throughout admission post procedure.   She was assessed by Dr. Cristy Hilts. Ganji on the morning of his discharge and was  considered to be stable for discharge home and was agreeable to administer  Lovenox injections by himself.   DISCHARGE MEDICATIONS:  Lovenox 75 mg one dose twice a day, Coumadin 7.5 mg  on August 27th and August 28th, and then on August 29th the patient will  come for blood draw.  Achieved INR, if therapeutic at that time we will  convert him to a home dose of Coumadin which is 5 mg daily except 2.5 on  Monday, Wednesday, and Friday.  If not the office will manage PT INR.  Coreg  6.25 mg b.i.d., Nexium 40 mg q.d., digoxin 0.125 mg p.o. q.d., amiodarone  200 mg p.o. q.d. 150 mcg p.o. q.d., Zocor 20 mg p.o. q.d., Altace 2.5 mg  p.o. b.i.d.   DISCHARGE ACTIVITIES:  No driving, no lifting greater than 10 pounds, no  strenuous activities for 72 hours post cath and procedure.   DISCHARGE DIET:  Low salt diet.   DISCHARGE INSTRUCTIONS:  He was allowed to shower.  He is to report any  bleeding, oozing, chest pain, redness and  swelling of the groin at puncture  site to our office, our number was provided.   FOLLOW UP:  Dr. Allyson Sabal October 06, 2001 at 11:15.  Also he is to report to  our lab for a blood draw on Friday September 26, 2001 to check PT INR.   DISPOSITION:  The patient was in stable condition.  His blood pressure was  112/60, heart rate 65.  Telemetry showed normal sinus rhythm and he was  discharged home in satisfactory condition.      Raymon Mutton, P.A.                    Runell Gess, M.D.    MK/MEDQ  D:  09/24/2001  T:  09/27/2001  Job:  [id number]   cc:   Runell Gess, M.D.  [address], [address]  [place]  [place] [address]  Fax: [phone]

## 2010-12-01 ENCOUNTER — Other Ambulatory Visit: Payer: Self-pay | Admitting: Family Medicine

## 2010-12-01 NOTE — Telephone Encounter (Signed)
Is this okay to refill? 

## 2010-12-14 ENCOUNTER — Ambulatory Visit (INDEPENDENT_AMBULATORY_CARE_PROVIDER_SITE_OTHER): Payer: BC Managed Care – PPO | Admitting: Medical

## 2010-12-14 ENCOUNTER — Encounter: Payer: Self-pay | Admitting: Medical

## 2010-12-14 DIAGNOSIS — J309 Allergic rhinitis, unspecified: Secondary | ICD-10-CM | POA: Insufficient documentation

## 2010-12-14 DIAGNOSIS — R05 Cough: Secondary | ICD-10-CM | POA: Insufficient documentation

## 2010-12-14 MED ORDER — FLUTICASONE FUROATE 27.5 MCG/SPRAY NA SUSP: 1.0000 | Freq: Every day | NASAL | Status: DC

## 2010-12-14 NOTE — Progress Notes (Signed)
Subjective:   HPI  Eric Ross is a 55 y.o. male who presents for 2 week history of cough, clear nonstop dripping nose, worse on the left, headache, nasal congestion, scratchy throat.  He has some left over Doxycycline from the summer, and has been taking this twice daily for 4 days with no improvement. He is using daily generic OTC allergy pill.  No sick contacts.  No other aggravating or relieving factors.    Of note, he has hx/o stroke with permanent right side paralysis.  See cardiology next week for recheck on Afib which has been difficult to control.  No other c/o.  The following portions of the patient's history were reviewed and updated as appropriate: allergies, current medications, past family history, past medical history, past social history, past surgical history and problem list.  Past Medical History  Diagnosis Date  . Cardiomyopathy   . Stroke   . History of thyroid cancer   . Hypothyroidism   . Pseudogout   . Erectile dysfunction   . Atrial fibrillation     Dr. Allyson Sabal    Review of Systems Constitutional: +subjective fever, -chills, -sweats, -unexpected -weight change,-fatigue ENT: +runny nose, -ear pain, +sore throat Cardiology:  +chest pain, -palpitations, -edema Respiratory: +cough, -shortness of breath, +wheezing Gastroenterology: -abdominal pain, -nausea, -vomiting, -diarrhea, -constipation Hematology: -bleeding or bruising problems Musculoskeletal: -arthralgias, -myalgias, -joint swelling, -back pain Ophthalmology: -vision changes Urology: -dysuria, -difficulty urinating, -hematuria, -urinary frequency, -urgency Neurology: +headache, -weakness, -tingling, -numbness  OBJECTIVE:  General appearance: Alert, WD/WN, no distress, white male, obvious speech abnormality                             Skin: warm, no rash                           Head: no sinus tenderness                            Eyes: conjunctiva normal, corneas clear, PERRLA            Ears: pearly TMs, external ear canals normal                          Nose: septum midline, turbinates swollen, clear discharge             Mouth/throat: MMM, tongue normal, mild pharyngeal erythema                           Neck: supple, no adenopathy, no thyromegaly, nontender                          Heart: RRR, normal S1, S2, no murmurs                         Lungs: CTA bilaterally, no wheezes, rales, or rhonchi      Assessment and Plan:   Encounter Diagnoses  Name Primary?  . Allergic rhinitis Yes  . Cough     Advised he begin sample of Veramyst nasal spray, discussed OTC nasal saline, hydrate well, rest, c/tg OTC allergy pill.  Since he has already begun the Doxycyline he had at home, he can finish the course to cover for possible early sinusitis.  Tylenol  or Ibuprofen OTC for fever and malaise.  Discussed symptomatic relief, nasal saline, and call or return if worse or not improving in 2-3 days.

## 2010-12-14 NOTE — Patient Instructions (Signed)
Begin Veramyst, 1-2 sprays per nostril twice daily for a week then once daily as needed.  Continue OTC allergy pill daily at bedtime.  You can also use OTC nasal saline spray or saline flush (Neti Pot) once to twice daily, but at least an hour before using Veramyst.  Your symptoms today are more suggestive of allergies given the runny nose, headache, and cough.  Your lung exam is clear today.  If you are worse in terms of fever, green nasal discharge, or feeling more sick by Monday, let me know.

## 2010-12-15 ENCOUNTER — Encounter: Payer: Self-pay | Admitting: Family Medicine

## 2011-02-12 DIAGNOSIS — Z7901 Long term (current) use of anticoagulants: Secondary | ICD-10-CM | POA: Diagnosis not present

## 2011-03-14 DIAGNOSIS — Z7901 Long term (current) use of anticoagulants: Secondary | ICD-10-CM | POA: Diagnosis not present

## 2011-05-07 DIAGNOSIS — Z7901 Long term (current) use of anticoagulants: Secondary | ICD-10-CM | POA: Diagnosis not present

## 2011-05-16 DIAGNOSIS — R42 Dizziness and giddiness: Secondary | ICD-10-CM | POA: Diagnosis not present

## 2011-05-16 DIAGNOSIS — R079 Chest pain, unspecified: Secondary | ICD-10-CM | POA: Diagnosis not present

## 2011-05-16 DIAGNOSIS — I4891 Unspecified atrial fibrillation: Secondary | ICD-10-CM | POA: Diagnosis not present

## 2011-05-16 DIAGNOSIS — E782 Mixed hyperlipidemia: Secondary | ICD-10-CM | POA: Diagnosis not present

## 2011-05-17 DIAGNOSIS — R5383 Other fatigue: Secondary | ICD-10-CM | POA: Diagnosis not present

## 2011-05-17 DIAGNOSIS — Z79899 Other long term (current) drug therapy: Secondary | ICD-10-CM | POA: Diagnosis not present

## 2011-05-17 DIAGNOSIS — R5381 Other malaise: Secondary | ICD-10-CM | POA: Diagnosis not present

## 2011-05-17 DIAGNOSIS — T460X1A Poisoning by cardiac-stimulant glycosides and drugs of similar action, accidental (unintentional), initial encounter: Secondary | ICD-10-CM | POA: Diagnosis not present

## 2011-05-17 DIAGNOSIS — E782 Mixed hyperlipidemia: Secondary | ICD-10-CM | POA: Diagnosis not present

## 2011-05-30 DIAGNOSIS — R0602 Shortness of breath: Secondary | ICD-10-CM | POA: Diagnosis not present

## 2011-05-30 DIAGNOSIS — R079 Chest pain, unspecified: Secondary | ICD-10-CM | POA: Diagnosis not present

## 2011-05-30 DIAGNOSIS — R42 Dizziness and giddiness: Secondary | ICD-10-CM | POA: Diagnosis not present

## 2011-05-30 DIAGNOSIS — E782 Mixed hyperlipidemia: Secondary | ICD-10-CM | POA: Diagnosis not present

## 2011-05-30 HISTORY — PX: OTHER SURGICAL HISTORY: SHX169

## 2011-05-30 HISTORY — PX: TRANSTHORACIC ECHOCARDIOGRAM: SHX275

## 2011-06-13 DIAGNOSIS — I4891 Unspecified atrial fibrillation: Secondary | ICD-10-CM | POA: Diagnosis not present

## 2011-06-13 DIAGNOSIS — R55 Syncope and collapse: Secondary | ICD-10-CM | POA: Diagnosis not present

## 2011-07-03 DIAGNOSIS — Z7901 Long term (current) use of anticoagulants: Secondary | ICD-10-CM | POA: Diagnosis not present

## 2011-07-04 ENCOUNTER — Other Ambulatory Visit: Payer: Self-pay | Admitting: Family Medicine

## 2011-07-27 DIAGNOSIS — I4891 Unspecified atrial fibrillation: Secondary | ICD-10-CM | POA: Diagnosis not present

## 2011-07-27 DIAGNOSIS — E782 Mixed hyperlipidemia: Secondary | ICD-10-CM | POA: Diagnosis not present

## 2011-08-06 DIAGNOSIS — Z7901 Long term (current) use of anticoagulants: Secondary | ICD-10-CM | POA: Diagnosis not present

## 2011-08-10 DIAGNOSIS — I4891 Unspecified atrial fibrillation: Secondary | ICD-10-CM | POA: Diagnosis not present

## 2011-08-28 ENCOUNTER — Other Ambulatory Visit: Payer: Self-pay | Admitting: Family Medicine

## 2011-09-03 ENCOUNTER — Other Ambulatory Visit: Payer: Self-pay | Admitting: Family Medicine

## 2011-09-03 NOTE — Telephone Encounter (Signed)
This goes to you 

## 2011-09-03 NOTE — Telephone Encounter (Signed)
THIS GOES TO YOU

## 2011-09-04 DIAGNOSIS — Z7901 Long term (current) use of anticoagulants: Secondary | ICD-10-CM | POA: Diagnosis not present

## 2011-09-06 DIAGNOSIS — I4891 Unspecified atrial fibrillation: Secondary | ICD-10-CM | POA: Diagnosis not present

## 2011-09-24 DIAGNOSIS — I4891 Unspecified atrial fibrillation: Secondary | ICD-10-CM | POA: Diagnosis not present

## 2011-10-10 ENCOUNTER — Telehealth: Payer: Self-pay | Admitting: Family Medicine

## 2011-10-10 ENCOUNTER — Other Ambulatory Visit: Payer: Self-pay | Admitting: Family Medicine

## 2011-10-11 ENCOUNTER — Encounter: Payer: Self-pay | Admitting: Medical

## 2011-10-11 MED ORDER — LEVOTHYROXINE SODIUM 112 MCG PO TABS: 112.0000 ug | ORAL_TABLET | Freq: Every day | ORAL | Status: DC

## 2011-10-11 NOTE — Telephone Encounter (Signed)
Let the wife know that I did review the blood work from Dr. Hazle Coca office and will renew for another year

## 2011-10-11 NOTE — Telephone Encounter (Signed)
Left word for word message on home # 

## 2011-10-11 NOTE — Telephone Encounter (Signed)
Synthroid renewed. Blood work from Dr. Hazle Coca office shows TSH in the normal range. This was done in June

## 2011-10-12 DIAGNOSIS — Z7901 Long term (current) use of anticoagulants: Secondary | ICD-10-CM | POA: Diagnosis not present

## 2011-10-22 DIAGNOSIS — Z7901 Long term (current) use of anticoagulants: Secondary | ICD-10-CM | POA: Diagnosis not present

## 2011-12-05 DIAGNOSIS — Z7901 Long term (current) use of anticoagulants: Secondary | ICD-10-CM | POA: Diagnosis not present

## 2011-12-05 DIAGNOSIS — I428 Other cardiomyopathies: Secondary | ICD-10-CM | POA: Diagnosis not present

## 2011-12-05 DIAGNOSIS — R079 Chest pain, unspecified: Secondary | ICD-10-CM | POA: Diagnosis not present

## 2011-12-05 DIAGNOSIS — E782 Mixed hyperlipidemia: Secondary | ICD-10-CM | POA: Diagnosis not present

## 2011-12-11 DIAGNOSIS — Z7901 Long term (current) use of anticoagulants: Secondary | ICD-10-CM | POA: Diagnosis not present

## 2011-12-19 DIAGNOSIS — Z7901 Long term (current) use of anticoagulants: Secondary | ICD-10-CM | POA: Diagnosis not present

## 2012-01-04 DIAGNOSIS — Z7901 Long term (current) use of anticoagulants: Secondary | ICD-10-CM | POA: Diagnosis not present

## 2012-02-25 DIAGNOSIS — Z7901 Long term (current) use of anticoagulants: Secondary | ICD-10-CM | POA: Diagnosis not present

## 2012-03-28 DIAGNOSIS — Z7901 Long term (current) use of anticoagulants: Secondary | ICD-10-CM | POA: Diagnosis not present

## 2012-04-23 ENCOUNTER — Encounter: Payer: Self-pay | Admitting: Pharmacist Clinician (PhC)/ Clinical Pharmacy Specialist

## 2012-04-23 DIAGNOSIS — Z8673 Personal history of transient ischemic attack (TIA), and cerebral infarction without residual deficits: Secondary | ICD-10-CM | POA: Insufficient documentation

## 2012-04-23 DIAGNOSIS — I2589 Other forms of chronic ischemic heart disease: Secondary | ICD-10-CM | POA: Diagnosis not present

## 2012-04-23 DIAGNOSIS — I639 Cerebral infarction, unspecified: Secondary | ICD-10-CM

## 2012-04-23 DIAGNOSIS — I4891 Unspecified atrial fibrillation: Secondary | ICD-10-CM | POA: Insufficient documentation

## 2012-04-23 DIAGNOSIS — Z7901 Long term (current) use of anticoagulants: Secondary | ICD-10-CM | POA: Insufficient documentation

## 2012-04-23 DIAGNOSIS — E782 Mixed hyperlipidemia: Secondary | ICD-10-CM | POA: Diagnosis not present

## 2012-05-01 DIAGNOSIS — Z7901 Long term (current) use of anticoagulants: Secondary | ICD-10-CM | POA: Diagnosis not present

## 2012-06-12 ENCOUNTER — Other Ambulatory Visit: Payer: Self-pay | Admitting: Cardiovascular Disease

## 2012-06-12 DIAGNOSIS — Z7901 Long term (current) use of anticoagulants: Secondary | ICD-10-CM | POA: Diagnosis not present

## 2012-06-12 LAB — PROTIME-INR
INR: 2.8 — ABNORMAL HIGH (ref <1.50)
Prothrombin Time: 27.5 seconds — ABNORMAL HIGH (ref 11.6–15.2)

## 2012-06-24 ENCOUNTER — Encounter: Payer: Self-pay | Admitting: Family Medicine

## 2012-06-24 ENCOUNTER — Ambulatory Visit (INDEPENDENT_AMBULATORY_CARE_PROVIDER_SITE_OTHER): Payer: Medicare Other | Admitting: Family Medicine

## 2012-06-24 VITALS — BP 110/70 | HR 72 | Ht 72.0 in | Wt 177.0 lb

## 2012-06-24 DIAGNOSIS — I6350 Cerebral infarction due to unspecified occlusion or stenosis of unspecified cerebral artery: Secondary | ICD-10-CM

## 2012-06-24 DIAGNOSIS — I4891 Unspecified atrial fibrillation: Secondary | ICD-10-CM

## 2012-06-24 DIAGNOSIS — Z7901 Long term (current) use of anticoagulants: Secondary | ICD-10-CM

## 2012-06-24 DIAGNOSIS — J309 Allergic rhinitis, unspecified: Secondary | ICD-10-CM

## 2012-06-24 DIAGNOSIS — Z79899 Other long term (current) drug therapy: Secondary | ICD-10-CM | POA: Diagnosis not present

## 2012-06-24 DIAGNOSIS — Z0000 Encounter for general adult medical examination without abnormal findings: Secondary | ICD-10-CM | POA: Diagnosis not present

## 2012-06-24 DIAGNOSIS — I639 Cerebral infarction, unspecified: Secondary | ICD-10-CM

## 2012-06-24 DIAGNOSIS — E039 Hypothyroidism, unspecified: Secondary | ICD-10-CM | POA: Diagnosis not present

## 2012-06-24 DIAGNOSIS — N529 Male erectile dysfunction, unspecified: Secondary | ICD-10-CM

## 2012-06-24 MED ORDER — TADALAFIL 20 MG PO TABS: 20.0000 mg | ORAL_TABLET | Freq: Every day | ORAL | Status: DC | PRN

## 2012-06-24 NOTE — Progress Notes (Signed)
  Subjective:    Patient ID: Eric Ross, male    DOB: 02/14/1955, 57 y.o.   MRN: [id number]  HPI He is here for complete examination. He does have an underlying atrial fibrillation and continues on multiple medications for this including Coumadin. He seems to be quite stable on this medication regimen. He did have a CVA from this several years ago but has not had any difficulties with that in quite some time. Her allergies are under good control with OTC Zyrtec. He continues on his thyroid medication and again is having no trouble with that. He does have difficulty with erectile dysfunction and has used Viagra in the past. He would like a refill on this. Otherwise he has no particular concerns or complaints. Social and family history were reviewed.   Review of Systems Negative except as above    Objective:   Physical Exam BP 110/70  Pulse 72  Ht 6' (1.829 m)  Wt 177 lb (80.287 kg)  BMI 24 kg/m2  General Appearance:    Alert, cooperative, no distress, appears stated age  Head:    Normocephalic, without obvious abnormality, atraumatic  Eyes:    PERRL, conjunctiva/corneas clear, EOM's intact, fundi    benign  Ears:    Normal TM's and external ear canals  Nose:   Nares normal, mucosa normal, no drainage or sinus   tenderness  Throat:   Lips, mucosa, and tongue normal; teeth and gums normal  Neck:   Supple, no lymphadenopathy;  thyroid:  no   enlargement/tenderness/nodules; no carotid   bruit or JVD  Back:    Spine nontender, no curvature, ROM normal, no CVA     tenderness  Lungs:     Clear to auscultation bilaterally without wheezes, rales or     ronchi; respirations unlabored  Chest Wall:    No tenderness or deformity   Heart:    Regular rate and rhythm, S1 and S2 normal, no murmur, rub   or gallop  Breast Exam:    No chest wall tenderness, masses or gynecomastia  Abdomen:     Soft, non-tender, nondistended, normoactive bowel sounds,    no masses, no hepatosplenomegaly   Genitalia:  deferred  Rectal:  Deferred at patient request  Extremities:   No clubbing, cyanosis or edema  Pulses:   2+ and symmetric all extremities  Skin:   Skin color, texture, turgor normal, no rashes or lesions  Lymph nodes:   Cervical, supraclavicular, and axillary nodes normal  Neurologic:   CNII-XII intact, normal strength, sensation and gait; reflexes 2+ and symmetric throughout          Psych:   Normal mood, affect, hygiene and grooming.    stool cards will be given.       Assessment & Plan:  Routine general medical examination at a health care facility - Plan: CBC with Differential, Comprehensive metabolic panel, Lipid panel  Allergic rhinitis  Atrial fibrillation  Long term (current) use of anticoagulants  CVA (cerebral vascular accident)  Hypothyroid - Plan: TSH  Encounter for long-term (current) use of other medications - Plan: CBC with Differential, Comprehensive metabolic panel, Lipid panel, TSH  ED (erectile dysfunction) - Plan: tadalafil (CIALIS) 20 MG tablet I discussed switching to an oral daily medication instead of Coumadin however he is quite comfortable with his present Coumadin dosing. Apparently he has very little difficulty with this.

## 2012-06-25 ENCOUNTER — Ambulatory Visit (INDEPENDENT_AMBULATORY_CARE_PROVIDER_SITE_OTHER): Payer: Self-pay | Admitting: Pharmacist Clinician (PhC)/ Clinical Pharmacy Specialist

## 2012-06-25 ENCOUNTER — Other Ambulatory Visit: Payer: Self-pay

## 2012-06-25 DIAGNOSIS — I6350 Cerebral infarction due to unspecified occlusion or stenosis of unspecified cerebral artery: Secondary | ICD-10-CM

## 2012-06-25 DIAGNOSIS — I639 Cerebral infarction, unspecified: Secondary | ICD-10-CM

## 2012-06-25 DIAGNOSIS — I4891 Unspecified atrial fibrillation: Secondary | ICD-10-CM

## 2012-06-25 DIAGNOSIS — Z7901 Long term (current) use of anticoagulants: Secondary | ICD-10-CM

## 2012-06-25 LAB — CBC WITH DIFFERENTIAL/PLATELET
Basophils Absolute: 0 10*3/uL (ref 0.0–0.1)
Basophils Relative: 1 % (ref 0–1)
Lymphocytes Relative: 28 % (ref 12–46)
Neutro Abs: 4.5 10*3/uL (ref 1.7–7.7)
Platelets: 184 10*3/uL (ref 150–400)
RDW: 13.6 % (ref 11.5–15.5)
WBC: 7.3 10*3/uL (ref 4.0–10.5)

## 2012-06-25 LAB — LIPID PANEL
LDL Cholesterol: 81 mg/dL (ref 0–99)
Total CHOL/HDL Ratio: 3.7 Ratio

## 2012-06-25 LAB — COMPREHENSIVE METABOLIC PANEL
ALT: 15 U/L (ref 0–53)
AST: 16 U/L (ref 0–37)
Albumin: 4.8 g/dL (ref 3.5–5.2)
Alkaline Phosphatase: 42 U/L (ref 39–117)
Calcium: 9 mg/dL (ref 8.4–10.5)
Chloride: 104 mEq/L (ref 96–112)
Creat: 0.97 mg/dL (ref 0.50–1.35)
Potassium: 4.3 mEq/L (ref 3.5–5.3)

## 2012-06-25 LAB — TSH: TSH: 0.338 u[IU]/mL — ABNORMAL LOW (ref 0.350–4.500)

## 2012-06-25 MED ORDER — CARVEDILOL 12.5 MG PO TABS: ORAL_TABLET | ORAL | Status: DC

## 2012-06-25 MED ORDER — SIMVASTATIN 20 MG PO TABS: ORAL_TABLET | ORAL | Status: DC

## 2012-06-25 MED ORDER — RAMIPRIL 2.5 MG PO CAPS: ORAL_CAPSULE | ORAL | Status: DC

## 2012-06-25 MED ORDER — LEVOTHYROXINE SODIUM 112 MCG PO TABS: 112.0000 ug | ORAL_TABLET | Freq: Every day | ORAL | Status: DC

## 2012-06-25 NOTE — Telephone Encounter (Signed)
Sent all meds in for 1 year per [place]

## 2012-06-25 NOTE — Progress Notes (Signed)
Quick Note:  CALLED PT TO INFORM LABS LOOK GOOD INCLUDING TSH WIFE VERBALIZED UNDERSTANDING MEDS REFILLED FOR 1 YEAR ______

## 2012-07-15 ENCOUNTER — Other Ambulatory Visit: Payer: Self-pay | Admitting: Cardiovascular Disease

## 2012-07-15 ENCOUNTER — Ambulatory Visit (INDEPENDENT_AMBULATORY_CARE_PROVIDER_SITE_OTHER): Payer: Self-pay | Admitting: Pharmacist Clinician (PhC)/ Clinical Pharmacy Specialist

## 2012-07-15 DIAGNOSIS — Z7901 Long term (current) use of anticoagulants: Secondary | ICD-10-CM | POA: Diagnosis not present

## 2012-07-15 DIAGNOSIS — I4891 Unspecified atrial fibrillation: Secondary | ICD-10-CM

## 2012-07-15 DIAGNOSIS — I6350 Cerebral infarction due to unspecified occlusion or stenosis of unspecified cerebral artery: Secondary | ICD-10-CM

## 2012-07-15 DIAGNOSIS — I639 Cerebral infarction, unspecified: Secondary | ICD-10-CM

## 2012-07-15 LAB — PROTIME-INR
INR: 2.71 — ABNORMAL HIGH (ref <1.50)
Prothrombin Time: 26.8 seconds — ABNORMAL HIGH (ref 11.6–15.2)

## 2012-09-04 ENCOUNTER — Other Ambulatory Visit: Payer: Self-pay | Admitting: Cardiovascular Disease

## 2012-09-04 DIAGNOSIS — Z7901 Long term (current) use of anticoagulants: Secondary | ICD-10-CM | POA: Diagnosis not present

## 2012-09-24 ENCOUNTER — Encounter: Payer: Self-pay | Admitting: Medical

## 2012-09-24 ENCOUNTER — Ambulatory Visit (INDEPENDENT_AMBULATORY_CARE_PROVIDER_SITE_OTHER): Payer: Medicare Other | Admitting: Medical

## 2012-09-24 VITALS — BP 92/60 | HR 86 | Temp 97.8°F | Resp 16 | Wt 175.0 lb

## 2012-09-24 DIAGNOSIS — I69922 Dysarthria following unspecified cerebrovascular disease: Secondary | ICD-10-CM

## 2012-09-24 DIAGNOSIS — I429 Cardiomyopathy, unspecified: Secondary | ICD-10-CM

## 2012-09-24 DIAGNOSIS — I4891 Unspecified atrial fibrillation: Secondary | ICD-10-CM | POA: Diagnosis not present

## 2012-09-24 DIAGNOSIS — Z8673 Personal history of transient ischemic attack (TIA), and cerebral infarction without residual deficits: Secondary | ICD-10-CM | POA: Diagnosis not present

## 2012-09-24 DIAGNOSIS — I428 Other cardiomyopathies: Secondary | ICD-10-CM | POA: Diagnosis not present

## 2012-09-24 DIAGNOSIS — R269 Unspecified abnormalities of gait and mobility: Secondary | ICD-10-CM

## 2012-09-24 NOTE — Progress Notes (Signed)
  Subjective:   He is here for form completion.  Wife accompanies him today.  He is disabled, and has to have these insurance forms completed from time to time.  He was seen recently for a phsysical.  His history is significant for CVA in 2003, Afib, dysarthria, cardiomyopathy, right sided numbness, right hand weakness and limitation, gait disturbance.   He has not been able to work since 2003.  He can't communicate effectively to hold a job.  Has speech difficulties.   He can't walk or stand for long periods.  He doesn't have good hand eye coordination.  He has gait problems.  He is able to climb to some extent, do some over head motion, can lift up to 40 lb.  No new c/o.     Objective:   Physical Exam BP 92/60  Pulse 86  Temp(Src) 97.8 F (36.6 C) (Oral)  Resp 16  Wt 175 lb (79.379 kg)  BMI 23.73 kg/m2  General appearance: alert, no distress, WD/WN Musculoskeletal: nontender, no swelling, no obvious deformity Extremities: no edema Pulses: 2+ symmetric, upper and lower extremities, normal cap refill Neurological: alert, oriented, dysarthria present, but otherwise CN2-12 intact, right arm and leg strength somewhat decreased compared to left side, right finger strength and opposition impaired, gait impaired, otherwise seems to have nonfocal exam. Psychiatric: normal affect, behavior normal, pleasant    Assessment and Plan :    Encounter Diagnoses  Name Primary?  . History of stroke Yes  . 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 A-fib   . Cardiomyopathy   . Gait disturbance    Completed his forms, reviewed his health history, and examined pt.

## 2012-10-03 ENCOUNTER — Ambulatory Visit (INDEPENDENT_AMBULATORY_CARE_PROVIDER_SITE_OTHER): Payer: BC Managed Care – PPO | Admitting: Pharmacist Clinician (PhC)/ Clinical Pharmacy Specialist

## 2012-10-03 DIAGNOSIS — Z7901 Long term (current) use of anticoagulants: Secondary | ICD-10-CM

## 2012-10-03 DIAGNOSIS — I6350 Cerebral infarction due to unspecified occlusion or stenosis of unspecified cerebral artery: Secondary | ICD-10-CM

## 2012-10-03 DIAGNOSIS — I4891 Unspecified atrial fibrillation: Secondary | ICD-10-CM

## 2012-10-03 DIAGNOSIS — I639 Cerebral infarction, unspecified: Secondary | ICD-10-CM

## 2012-10-21 ENCOUNTER — Encounter: Payer: Self-pay | Admitting: Cardiovascular Disease

## 2012-10-28 ENCOUNTER — Other Ambulatory Visit: Payer: Self-pay | Admitting: Cardiovascular Disease

## 2012-10-28 ENCOUNTER — Ambulatory Visit (INDEPENDENT_AMBULATORY_CARE_PROVIDER_SITE_OTHER): Payer: BC Managed Care – PPO | Admitting: Pharmacist Clinician (PhC)/ Clinical Pharmacy Specialist

## 2012-10-28 DIAGNOSIS — I6350 Cerebral infarction due to unspecified occlusion or stenosis of unspecified cerebral artery: Secondary | ICD-10-CM

## 2012-10-28 DIAGNOSIS — I639 Cerebral infarction, unspecified: Secondary | ICD-10-CM

## 2012-10-28 DIAGNOSIS — Z7901 Long term (current) use of anticoagulants: Secondary | ICD-10-CM

## 2012-10-28 DIAGNOSIS — I4891 Unspecified atrial fibrillation: Secondary | ICD-10-CM

## 2012-10-28 LAB — PROTIME-INR: INR: 1.94 — ABNORMAL HIGH (ref <1.50)

## 2012-11-04 ENCOUNTER — Other Ambulatory Visit: Payer: Self-pay | Admitting: Cardiovascular Disease

## 2012-11-05 NOTE — Telephone Encounter (Signed)
Rx was sent to pharmacy electronically. 

## 2012-11-21 ENCOUNTER — Other Ambulatory Visit: Payer: Self-pay | Admitting: Cardiovascular Disease

## 2012-11-21 DIAGNOSIS — Z7901 Long term (current) use of anticoagulants: Secondary | ICD-10-CM | POA: Diagnosis not present

## 2012-11-24 ENCOUNTER — Ambulatory Visit (INDEPENDENT_AMBULATORY_CARE_PROVIDER_SITE_OTHER): Payer: BC Managed Care – PPO | Admitting: Pharmacist Clinician (PhC)/ Clinical Pharmacy Specialist

## 2012-11-24 ENCOUNTER — Other Ambulatory Visit: Payer: Self-pay | Admitting: Pharmacist Clinician (PhC)/ Clinical Pharmacy Specialist

## 2012-11-24 ENCOUNTER — Other Ambulatory Visit: Payer: Self-pay | Admitting: *Deleted

## 2012-11-24 DIAGNOSIS — I639 Cerebral infarction, unspecified: Secondary | ICD-10-CM

## 2012-11-24 DIAGNOSIS — Z7901 Long term (current) use of anticoagulants: Secondary | ICD-10-CM

## 2012-11-24 DIAGNOSIS — I4891 Unspecified atrial fibrillation: Secondary | ICD-10-CM

## 2012-11-24 DIAGNOSIS — I6350 Cerebral infarction due to unspecified occlusion or stenosis of unspecified cerebral artery: Secondary | ICD-10-CM

## 2012-11-24 MED ORDER — WARFARIN SODIUM 5 MG PO TABS: ORAL_TABLET | ORAL | Status: DC

## 2012-12-30 ENCOUNTER — Other Ambulatory Visit: Payer: Self-pay | Admitting: Cardiovascular Disease

## 2012-12-30 DIAGNOSIS — Z7901 Long term (current) use of anticoagulants: Secondary | ICD-10-CM | POA: Diagnosis not present

## 2012-12-31 ENCOUNTER — Ambulatory Visit (INDEPENDENT_AMBULATORY_CARE_PROVIDER_SITE_OTHER): Payer: BC Managed Care – PPO | Admitting: Pharmacist Clinician (PhC)/ Clinical Pharmacy Specialist

## 2012-12-31 DIAGNOSIS — I6350 Cerebral infarction due to unspecified occlusion or stenosis of unspecified cerebral artery: Secondary | ICD-10-CM

## 2012-12-31 DIAGNOSIS — D235 Other benign neoplasm of skin of trunk: Secondary | ICD-10-CM | POA: Diagnosis not present

## 2012-12-31 DIAGNOSIS — Z7901 Long term (current) use of anticoagulants: Secondary | ICD-10-CM

## 2012-12-31 DIAGNOSIS — I639 Cerebral infarction, unspecified: Secondary | ICD-10-CM

## 2012-12-31 DIAGNOSIS — I4891 Unspecified atrial fibrillation: Secondary | ICD-10-CM

## 2012-12-31 DIAGNOSIS — D1801 Hemangioma of skin and subcutaneous tissue: Secondary | ICD-10-CM | POA: Diagnosis not present

## 2012-12-31 LAB — PROTIME-INR
INR: 3.07 — ABNORMAL HIGH (ref <1.50)
Prothrombin Time: 30.3 seconds — ABNORMAL HIGH (ref 11.6–15.2)

## 2013-01-28 ENCOUNTER — Other Ambulatory Visit: Payer: Self-pay | Admitting: Pharmacist Clinician (PhC)/ Clinical Pharmacy Specialist

## 2013-02-12 ENCOUNTER — Other Ambulatory Visit: Payer: Self-pay | Admitting: Cardiovascular Disease

## 2013-02-12 DIAGNOSIS — Z7901 Long term (current) use of anticoagulants: Secondary | ICD-10-CM | POA: Diagnosis not present

## 2013-02-12 LAB — PROTIME-INR
INR: 3.34 — AB (ref <1.50)
Prothrombin Time: 32.9 seconds — ABNORMAL HIGH (ref 11.6–15.2)

## 2013-02-13 ENCOUNTER — Ambulatory Visit (INDEPENDENT_AMBULATORY_CARE_PROVIDER_SITE_OTHER): Payer: BC Managed Care – PPO | Admitting: Pharmacist Clinician (PhC)/ Clinical Pharmacy Specialist

## 2013-02-13 DIAGNOSIS — I4891 Unspecified atrial fibrillation: Secondary | ICD-10-CM

## 2013-02-13 DIAGNOSIS — I6350 Cerebral infarction due to unspecified occlusion or stenosis of unspecified cerebral artery: Secondary | ICD-10-CM

## 2013-02-13 DIAGNOSIS — Z7901 Long term (current) use of anticoagulants: Secondary | ICD-10-CM

## 2013-02-13 DIAGNOSIS — I639 Cerebral infarction, unspecified: Secondary | ICD-10-CM

## 2013-03-23 ENCOUNTER — Other Ambulatory Visit: Payer: Self-pay | Admitting: Cardiovascular Disease

## 2013-03-23 DIAGNOSIS — Z7901 Long term (current) use of anticoagulants: Secondary | ICD-10-CM | POA: Diagnosis not present

## 2013-03-23 LAB — PROTIME-INR
INR: 2.73 — AB (ref <1.50)
Prothrombin Time: 28.2 seconds — ABNORMAL HIGH (ref 11.6–15.2)

## 2013-03-24 ENCOUNTER — Ambulatory Visit (INDEPENDENT_AMBULATORY_CARE_PROVIDER_SITE_OTHER): Payer: BC Managed Care – PPO | Admitting: Pharmacist Clinician (PhC)/ Clinical Pharmacy Specialist

## 2013-03-24 DIAGNOSIS — Z7901 Long term (current) use of anticoagulants: Secondary | ICD-10-CM

## 2013-03-24 DIAGNOSIS — I4891 Unspecified atrial fibrillation: Secondary | ICD-10-CM

## 2013-03-24 DIAGNOSIS — I639 Cerebral infarction, unspecified: Secondary | ICD-10-CM

## 2013-03-24 DIAGNOSIS — I6350 Cerebral infarction due to unspecified occlusion or stenosis of unspecified cerebral artery: Secondary | ICD-10-CM

## 2013-04-20 ENCOUNTER — Other Ambulatory Visit: Payer: Self-pay

## 2013-04-20 MED ORDER — PANTOPRAZOLE SODIUM 40 MG PO TBEC: 40.0000 mg | DELAYED_RELEASE_TABLET | Freq: Every day | ORAL | Status: DC

## 2013-04-20 NOTE — Telephone Encounter (Signed)
Rx was sent to pharmacy electronically. 

## 2013-04-21 ENCOUNTER — Other Ambulatory Visit: Payer: Self-pay

## 2013-04-21 MED ORDER — PANTOPRAZOLE SODIUM 40 MG PO TBEC: 40.0000 mg | DELAYED_RELEASE_TABLET | Freq: Every day | ORAL | Status: DC

## 2013-04-21 NOTE — Telephone Encounter (Signed)
Rx was sent to pharmacy electronically. 

## 2013-04-22 ENCOUNTER — Other Ambulatory Visit: Payer: Self-pay | Admitting: *Deleted

## 2013-04-24 ENCOUNTER — Other Ambulatory Visit: Payer: Self-pay | Admitting: *Deleted

## 2013-05-01 ENCOUNTER — Other Ambulatory Visit: Payer: Self-pay | Admitting: Cardiovascular Disease

## 2013-05-01 DIAGNOSIS — Z7901 Long term (current) use of anticoagulants: Secondary | ICD-10-CM | POA: Diagnosis not present

## 2013-05-02 LAB — PROTIME-INR
INR: 2.48 — AB (ref <1.50)
PROTHROMBIN TIME: 26.2 s — AB (ref 11.6–15.2)

## 2013-05-04 ENCOUNTER — Ambulatory Visit (INDEPENDENT_AMBULATORY_CARE_PROVIDER_SITE_OTHER): Payer: BC Managed Care – PPO | Admitting: Pharmacist Clinician (PhC)/ Clinical Pharmacy Specialist

## 2013-05-04 DIAGNOSIS — I4891 Unspecified atrial fibrillation: Secondary | ICD-10-CM

## 2013-05-04 DIAGNOSIS — I639 Cerebral infarction, unspecified: Secondary | ICD-10-CM

## 2013-05-04 DIAGNOSIS — I6350 Cerebral infarction due to unspecified occlusion or stenosis of unspecified cerebral artery: Secondary | ICD-10-CM

## 2013-05-04 DIAGNOSIS — Z7901 Long term (current) use of anticoagulants: Secondary | ICD-10-CM

## 2013-05-27 ENCOUNTER — Emergency Department (INDEPENDENT_AMBULATORY_CARE_PROVIDER_SITE_OTHER)
Admission: EM | Admit: 2013-05-27 | Discharge: 2013-05-27 | Disposition: A | Discharge status: Home / Self Care | Payer: Medicare Other | Source: Home / Self Care | Attending: Family Medicine | Admitting: Family Medicine

## 2013-05-27 ENCOUNTER — Encounter: Payer: Self-pay | Admitting: Emergency Medicine

## 2013-05-27 DIAGNOSIS — R197 Diarrhea, unspecified: Secondary | ICD-10-CM

## 2013-05-27 LAB — POCT CBC W AUTO DIFF (K'VILLE URGENT CARE)

## 2013-05-27 LAB — POCT URINALYSIS DIP (MANUAL ENTRY)
Blood, UA: NEGATIVE
GLUCOSE UA: NEGATIVE
LEUKOCYTES UA: NEGATIVE
Nitrite, UA: NEGATIVE
Protein Ur, POC: 30
Urobilinogen, UA: 0.2 (ref 0–1)
pH, UA: 5.5 (ref 5–8)

## 2013-05-27 NOTE — ED Provider Notes (Signed)
CSN: [id number]     Arrival date & time 05/27/13  1013 History   First MD Initiated Contact with Patient 05/27/13 1115     Chief Complaint  Patient presents with  . Diarrhea  . Emesis  . Abdominal Pain      HPI Comments: About 4 days ago patient developed vague mid and left lower quadrant abdominal pain followed by watery diarrhea and nausea.  He had one episode of vomiting.  No fevers, chills, and sweats.  No hematochezia.  Denies recent foreign travel, or drinking untreated water in a wilderness environment.   Patient is a 58 y.o. male presenting with diarrhea. The history is provided by the patient.  Diarrhea Quality:  Watery Severity:  Mild Onset quality:  Sudden Number of episodes:  Several Duration:  4 days Timing:  Intermittent Progression:  Improving Relieved by:  Nothing Exacerbated by: eating. Ineffective treatments:  None tried Associated symptoms: abdominal pain and headaches   Associated symptoms: no arthralgias, no chills, no recent cough, no diaphoresis, no fever, no myalgias and no URI   Risk factors: no recent antibiotic use, no sick contacts, no suspicious food intake and no travel to endemic areas     Past Medical History  Diagnosis Date  . Cardiomyopathy   . History of thyroid cancer   . Hypothyroidism   . Pseudogout   . Erectile dysfunction   . Atrial fibrillation     Dr. Gwenlyn Found  . Stroke 2003   Past Surgical History  Procedure Laterality Date  . Joint replacement      BILATERAL KNEE   . Thyroidectomy     Family History  Problem Relation Age of Onset  . Cancer Mother     thyroid  . Cancer Sister   . Stroke Brother    History  Substance Use Topics  . Smoking status: Never Smoker   . Smokeless tobacco: Current User    Types: Chew  . Alcohol Use: No    Review of Systems  Constitutional: Negative for fever, chills and diaphoresis.  Gastrointestinal: Positive for abdominal pain and diarrhea.  Musculoskeletal: Negative for arthralgias and  myalgias.  Neurological: Positive for headaches.  All other systems reviewed and are negative.   Allergies  Penicillins  Home Medications   Prior to Admission medications   Medication Sig Start Date End Date Taking? Authorizing Provider  carvedilol (COREG) 12.5 MG tablet TAKE 1 TABLET TWICE A DAY 06/25/12   Denita Lung, MD  fluticasone (VERAMYST) 27.5 MCG/SPRAY nasal spray Place 1 spray into the nose daily. 12/14/10 12/14/11  Camelia Eng Tysinger, PA-C  levothyroxine (SYNTHROID, LEVOTHROID) 112 MCG tablet Take 1 tablet (112 mcg total) by mouth daily. 06/25/12   Denita Lung, MD  pantoprazole (PROTONIX) 40 MG tablet Take 1 tablet (40 mg total) by mouth daily. 04/21/13   Lorretta Harp, MD  ramipril (ALTACE) 2.5 MG capsule TAKE 1 CAPSULE EVERY DAY 06/25/12   Denita Lung, MD  simvastatin (ZOCOR) 20 MG tablet TAKE 1 TABLET EVERY DAY 06/25/12   Denita Lung, MD  tadalafil (CIALIS) 20 MG tablet Take 1 tablet (20 mg total) by mouth daily as needed for erectile dysfunction. 06/24/12   Denita Lung, MD  warfarin (COUMADIN) 5 MG tablet Take 1 tablet by mouth daily or as directed 01/28/13   Tommy Medal, RPH-CPP   BP 92/61  Pulse 77  Temp(Src) 97.7 F (36.5 C) (Oral)  Resp 18  Ht 6\' 1"  (1.854 m)  Wt 169  lb (76.658 kg)  BMI 22.30 kg/m2  SpO2 100% Physical Exam Nursing notes and Vital Signs reviewed. Appearance:  Patient appears healthy, stated age, and in no acute distress Eyes:  Pupils are equal, round, and reactive to light and accomodation.  Extraocular movement is intact.  Conjunctivae are not inflamed  Nose:  Mildly congested turbinates.  No sinus tenderness.   Pharynx:  Normal; moist mucous membranes  Neck:  Supple.  No adenopathy  Lungs:  Clear to auscultation.  Breath sounds are equal.  Heart:  Regular rate and rhythm without murmurs, rubs, or gallops.  Abdomen:  Nontender without masses or hepatosplenomegaly.  Bowel sounds are present.  No CVA or flank tenderness.   Extremities:  No edema.  No calf tenderness Skin:  No rash present.   ED Course  Procedures      Labs Reviewed  POCT CBC W AUTO DIFF ([facility])  WBC 10.1; LY 21.7; MO 4.1; GR 74.2; Hgb 15.0; Platelets 171   POCT URINALYSIS DIP (MANUAL ENTRY):  BIL Small; KET Trace; SG >= 1.030; PRO 30mg /dL; URO 0.2 E.U./dL         MDM   1. Diarrhea; suspect viral gastroenteritis    Begin clear liquids (Pedialyte while having diarrhea) until improved, then advance to a [employer] (Bananas, Rice, Applesauce, Toast).  Then gradually resume a regular diet when tolerated.  Avoid milk products until well.  To decrease diarrhea, mix one heaping tablespoon Citrucel (methylcellulose) in 8 oz water and drink one to three times daily.  When stools become more formed, may take Imodium (loperamide) once or twice daily to decrease stool frequency.  If symptoms become significantly worse during the night or over the weekend, proceed to the local emergency room.     Kandra Nicolas, MD 06/02/13 [id number]

## 2013-05-27 NOTE — Discharge Instructions (Signed)
Begin clear liquids (Pedialyte while having diarrhea) until improved, then advance to a BRAT diet (Bananas, Rice, Applesauce, Toast).  Then gradually resume a regular diet when tolerated.  Avoid milk products until well.  To decrease diarrhea, mix one heaping tablespoon Citrucel (methylcellulose) in 8 oz water and drink one to three times daily.  When stools become more formed, may take Imodium (loperamide) once or twice daily to decrease stool frequency.  °If symptoms become significantly worse during the night or over the weekend, proceed to the local emergency room.  °

## 2013-05-27 NOTE — ED Notes (Signed)
Pt c/o vomiting, LT mid abd pain and diarrhea 5 days ago, the vomiting has resolved. Denies fever, black or bloody stool.

## 2013-05-28 ENCOUNTER — Other Ambulatory Visit: Payer: Self-pay | Admitting: Cardiovascular Disease

## 2013-05-28 DIAGNOSIS — Z7901 Long term (current) use of anticoagulants: Secondary | ICD-10-CM | POA: Diagnosis not present

## 2013-05-29 ENCOUNTER — Ambulatory Visit (INDEPENDENT_AMBULATORY_CARE_PROVIDER_SITE_OTHER): Payer: BC Managed Care – PPO | Admitting: Pharmacist Clinician (PhC)/ Clinical Pharmacy Specialist

## 2013-05-29 DIAGNOSIS — I639 Cerebral infarction, unspecified: Secondary | ICD-10-CM

## 2013-05-29 DIAGNOSIS — Z7901 Long term (current) use of anticoagulants: Secondary | ICD-10-CM

## 2013-05-29 DIAGNOSIS — I4891 Unspecified atrial fibrillation: Secondary | ICD-10-CM

## 2013-05-29 DIAGNOSIS — I6350 Cerebral infarction due to unspecified occlusion or stenosis of unspecified cerebral artery: Secondary | ICD-10-CM

## 2013-05-29 LAB — PROTIME-INR
INR: 3.73 — ABNORMAL HIGH (ref <1.50)
Prothrombin Time: 35.8 seconds — ABNORMAL HIGH (ref 11.6–15.2)

## 2013-06-26 ENCOUNTER — Other Ambulatory Visit: Payer: Self-pay | Admitting: *Deleted

## 2013-06-26 ENCOUNTER — Other Ambulatory Visit: Payer: Self-pay | Admitting: Family Medicine

## 2013-06-30 ENCOUNTER — Other Ambulatory Visit: Payer: Self-pay | Admitting: *Deleted

## 2013-06-30 MED ORDER — PANTOPRAZOLE SODIUM 40 MG PO TBEC: 40.0000 mg | DELAYED_RELEASE_TABLET | Freq: Every day | ORAL | Status: DC

## 2013-06-30 NOTE — Telephone Encounter (Signed)
Rx refill sent to patient pharmacy with instructions to make appointment.

## 2013-07-06 ENCOUNTER — Other Ambulatory Visit: Payer: Self-pay | Admitting: Cardiovascular Disease

## 2013-07-06 DIAGNOSIS — Z7901 Long term (current) use of anticoagulants: Secondary | ICD-10-CM | POA: Diagnosis not present

## 2013-07-06 LAB — PROTIME-INR
INR: 2.87 — ABNORMAL HIGH (ref <1.50)
Prothrombin Time: 29.3 seconds — ABNORMAL HIGH (ref 11.6–15.2)

## 2013-07-07 ENCOUNTER — Ambulatory Visit (INDEPENDENT_AMBULATORY_CARE_PROVIDER_SITE_OTHER): Payer: BC Managed Care – PPO | Admitting: Pharmacist Clinician (PhC)/ Clinical Pharmacy Specialist

## 2013-07-07 DIAGNOSIS — I6350 Cerebral infarction due to unspecified occlusion or stenosis of unspecified cerebral artery: Secondary | ICD-10-CM

## 2013-07-07 DIAGNOSIS — I639 Cerebral infarction, unspecified: Secondary | ICD-10-CM

## 2013-07-07 DIAGNOSIS — Z7901 Long term (current) use of anticoagulants: Secondary | ICD-10-CM

## 2013-07-07 DIAGNOSIS — I4891 Unspecified atrial fibrillation: Secondary | ICD-10-CM

## 2013-07-22 ENCOUNTER — Other Ambulatory Visit: Payer: Self-pay | Admitting: Family Medicine

## 2013-07-22 NOTE — Telephone Encounter (Signed)
Is this okay to refill? 

## 2013-07-24 ENCOUNTER — Other Ambulatory Visit: Payer: Self-pay | Admitting: Pharmacist Clinician (PhC)/ Clinical Pharmacy Specialist

## 2013-07-28 ENCOUNTER — Other Ambulatory Visit: Payer: Self-pay | Admitting: Cardiovascular Disease

## 2013-07-28 DIAGNOSIS — Z7901 Long term (current) use of anticoagulants: Secondary | ICD-10-CM | POA: Diagnosis not present

## 2013-07-29 ENCOUNTER — Ambulatory Visit (INDEPENDENT_AMBULATORY_CARE_PROVIDER_SITE_OTHER): Payer: BC Managed Care – PPO | Admitting: Pharmacist Clinician (PhC)/ Clinical Pharmacy Specialist

## 2013-07-29 DIAGNOSIS — I639 Cerebral infarction, unspecified: Secondary | ICD-10-CM

## 2013-07-29 DIAGNOSIS — Z7901 Long term (current) use of anticoagulants: Secondary | ICD-10-CM

## 2013-07-29 DIAGNOSIS — I6350 Cerebral infarction due to unspecified occlusion or stenosis of unspecified cerebral artery: Secondary | ICD-10-CM

## 2013-07-29 LAB — PROTIME-INR
INR: 3 — AB (ref <1.50)
PROTHROMBIN TIME: 31.1 s — AB (ref 11.6–15.2)

## 2013-07-31 ENCOUNTER — Other Ambulatory Visit: Payer: Self-pay | Admitting: Family Medicine

## 2013-08-26 ENCOUNTER — Other Ambulatory Visit: Payer: Self-pay | Admitting: Family Medicine

## 2013-08-28 ENCOUNTER — Other Ambulatory Visit: Payer: Self-pay | Admitting: Cardiovascular Disease

## 2013-08-28 DIAGNOSIS — Z7901 Long term (current) use of anticoagulants: Secondary | ICD-10-CM | POA: Diagnosis not present

## 2013-08-29 LAB — PROTIME-INR
INR: 2.25 — AB (ref <1.50)
Prothrombin Time: 24.9 seconds — ABNORMAL HIGH (ref 11.6–15.2)

## 2013-08-31 ENCOUNTER — Ambulatory Visit (INDEPENDENT_AMBULATORY_CARE_PROVIDER_SITE_OTHER): Payer: BC Managed Care – PPO | Admitting: Pharmacist Clinician (PhC)/ Clinical Pharmacy Specialist

## 2013-08-31 ENCOUNTER — Telehealth: Payer: Self-pay | Admitting: Cardiovascular Disease

## 2013-08-31 DIAGNOSIS — Z7901 Long term (current) use of anticoagulants: Secondary | ICD-10-CM

## 2013-08-31 DIAGNOSIS — I6350 Cerebral infarction due to unspecified occlusion or stenosis of unspecified cerebral artery: Secondary | ICD-10-CM

## 2013-08-31 DIAGNOSIS — I639 Cerebral infarction, unspecified: Secondary | ICD-10-CM

## 2013-08-31 MED ORDER — SIMVASTATIN 20 MG PO TABS: ORAL_TABLET | ORAL | Status: DC

## 2013-08-31 NOTE — Telephone Encounter (Signed)
Spoke with pt, aware refills sent into the pharm

## 2013-08-31 NOTE — Telephone Encounter (Signed)
Pt need his refill until his appt on 09-30-13. Please call his Simvastatin 20 ng #30 to [phone].

## 2013-09-24 ENCOUNTER — Other Ambulatory Visit: Payer: Self-pay | Admitting: *Deleted

## 2013-09-24 MED ORDER — PANTOPRAZOLE SODIUM 40 MG PO TBEC: 40.0000 mg | DELAYED_RELEASE_TABLET | Freq: Every day | ORAL | Status: DC

## 2013-09-24 NOTE — Telephone Encounter (Signed)
Rx was sent to pharmacy electronically. Patient has appmt scheduled 9/2 with Dr. Gwenlyn Found

## 2013-09-25 ENCOUNTER — Other Ambulatory Visit: Payer: Self-pay | Admitting: *Deleted

## 2013-09-25 MED ORDER — PANTOPRAZOLE SODIUM 40 MG PO TBEC: 40.0000 mg | DELAYED_RELEASE_TABLET | Freq: Every day | ORAL | Status: DC

## 2013-09-25 NOTE — Telephone Encounter (Signed)
Rx was changed to 90 day supply

## 2013-09-29 ENCOUNTER — Encounter: Payer: Self-pay | Admitting: *Deleted

## 2013-09-30 ENCOUNTER — Ambulatory Visit (INDEPENDENT_AMBULATORY_CARE_PROVIDER_SITE_OTHER): Payer: Medicare Other | Admitting: Pharmacist Clinician (PhC)/ Clinical Pharmacy Specialist

## 2013-09-30 ENCOUNTER — Ambulatory Visit (INDEPENDENT_AMBULATORY_CARE_PROVIDER_SITE_OTHER): Payer: Medicare Other | Admitting: Cardiovascular Disease

## 2013-09-30 ENCOUNTER — Encounter: Payer: Self-pay | Admitting: Cardiovascular Disease

## 2013-09-30 VITALS — BP 120/82 | HR 75 | Ht 73.0 in | Wt 176.3 lb

## 2013-09-30 DIAGNOSIS — I10 Essential (primary) hypertension: Secondary | ICD-10-CM

## 2013-09-30 DIAGNOSIS — I4891 Unspecified atrial fibrillation: Secondary | ICD-10-CM

## 2013-09-30 DIAGNOSIS — I6350 Cerebral infarction due to unspecified occlusion or stenosis of unspecified cerebral artery: Secondary | ICD-10-CM

## 2013-09-30 DIAGNOSIS — Z7901 Long term (current) use of anticoagulants: Secondary | ICD-10-CM

## 2013-09-30 DIAGNOSIS — I639 Cerebral infarction, unspecified: Secondary | ICD-10-CM

## 2013-09-30 DIAGNOSIS — I482 Chronic atrial fibrillation, unspecified: Secondary | ICD-10-CM

## 2013-09-30 LAB — POCT INR: INR: 4.4

## 2013-09-30 NOTE — Patient Instructions (Signed)
Your physician wants you to follow-up in: 1 year with Dr Berry. You will receive a reminder letter in the mail two months in advance. If you don't receive a letter, please call our office to schedule the follow-up appointment.  

## 2013-09-30 NOTE — Assessment & Plan Note (Signed)
Rate controlled on current medications. He is on Coumadin anticoagulation

## 2013-09-30 NOTE — Assessment & Plan Note (Signed)
Controlled on current medications 

## 2013-09-30 NOTE — Progress Notes (Signed)
09/30/2013 PIUS BYROM   [birth year]  [id number]  Primary Physician Wyatt Haste, MD Primary Cardiologist: Lorretta Harp MD Renae Gloss   HPI:  Eric Ross is a 58 year old thin appearing married Caucasian male father of 32, grandfather and 3 grandchildren who is accompanied by his wife today. I last saw him 12/05/11. He was seen by Tenny Craw [facility] 04/23/12. He has a history of nonischemic cardiomyopathy cath performed in 2003 that showed normal coronary arteries. He has an ejection fraction in the 45-50% range. He does have chronic A. Fib on Coumadin anticoagulation rate controlled. His other problems include hyperlipidemia on statin therapy. He does get occasional chest burning and a Myoview stress test performed 05/30/11 which was normal.   Current Outpatient Prescriptions  Medication Sig Dispense Refill  . carvedilol (COREG) 12.5 MG tablet Take 1 tablet (12.5 mg total) by mouth 2 (two) times daily with a meal. PATIENT NEEDS OFFICE VISIT [phone]  180 tablet  0  . CIALIS 20 MG tablet TAKE 1 TABLET (20 MG TOTAL) BY MOUTH DAILY AS NEEDED FOR ERECTILE DYSFUNCTION.  5 tablet  10  . fluticasone (VERAMYST) 27.5 MCG/SPRAY nasal spray Place 1 spray into the nose as needed.      [name] levothyroxine (SYNTHROID, LEVOTHROID) 112 MCG tablet TAKE 1 TABLET (112 MCG TOTAL) BY MOUTH DAILY.  90 tablet  2  . pantoprazole (PROTONIX) 40 MG tablet Take 1 tablet (40 mg total) by mouth daily.  90 tablet  0  . ramipril (ALTACE) 2.5 MG capsule TAKE 1 CAPSULE EVERY DAY  90 capsule  4  . simvastatin (ZOCOR) 20 MG tablet TAKE 1 TABLET EVERY DAY  30 tablet  2  . warfarin (COUMADIN) 5 MG tablet TAKE 1 TABLET BY MOUTH DAILY OR AS DIRECTED  90 tablet  1   No current facility-administered medications for this visit.    Allergies  Allergen Reactions  . Penicillins     REACTION: Paralyzed him @ 58 yrs old    History   Social History  . Marital Status: Married    Spouse Name: N/A    Number of  Children: N/A  . Years of Education: N/A   Occupational History  . Not on file.   Social History Main Topics  . Smoking status: Never Smoker   . Smokeless tobacco: Current User    Types: Chew  . Alcohol Use: No  . Drug Use: No  . Sexual Activity: Not on file   Other Topics Concern  . Not on file   Social History Narrative  . No narrative on file     Review of Systems: General: negative for chills, fever, night sweats or weight changes.  Cardiovascular: negative for chest pain, dyspnea on exertion, edema, orthopnea, palpitations, paroxysmal nocturnal dyspnea or shortness of breath Dermatological: negative for rash Respiratory: negative for cough or wheezing Urologic: negative for hematuria Abdominal: negative for nausea, vomiting, diarrhea, bright red blood per rectum, melena, or hematemesis Neurologic: negative for visual changes, syncope, or dizziness All other systems reviewed and are otherwise negative except as noted above.    Blood pressure 120/82, pulse 75, height 6\' 1"  (1.854 m), weight 176 lb 4.8 oz (79.969 kg).  General appearance: alert and no distress Neck: no adenopathy, no carotid bruit, no JVD, supple, symmetrical, trachea midline and thyroid not enlarged, symmetric, no tenderness/mass/nodules Lungs: clear to auscultation bilaterally Heart: irregularly irregular rhythm Extremities: extremities normal, atraumatic, no cyanosis or edema  EKG atrial fibrillation with a ventricular response  of 75  ASSESSMENT AND PLAN:   HYPERTENSION Controlled on current medications  Atrial fibrillation Rate controlled on current medications. He is on Coumadin anticoagulation      Lorretta Harp MD [facility], [facility] 09/30/2013 10:50 AM

## 2013-10-02 ENCOUNTER — Telehealth: Payer: Self-pay | Admitting: *Deleted

## 2013-10-02 NOTE — Telephone Encounter (Signed)
Disability forms from [facility] filled out and faxed in to [facility].  Originals were mailed to patient.

## 2013-10-08 ENCOUNTER — Other Ambulatory Visit: Payer: Self-pay | Admitting: Cardiovascular Disease

## 2013-10-08 DIAGNOSIS — Z7901 Long term (current) use of anticoagulants: Secondary | ICD-10-CM | POA: Diagnosis not present

## 2013-10-09 ENCOUNTER — Ambulatory Visit (INDEPENDENT_AMBULATORY_CARE_PROVIDER_SITE_OTHER): Payer: Medicare Other | Admitting: Pharmacist Clinician (PhC)/ Clinical Pharmacy Specialist

## 2013-10-09 DIAGNOSIS — I639 Cerebral infarction, unspecified: Secondary | ICD-10-CM

## 2013-10-09 DIAGNOSIS — I4891 Unspecified atrial fibrillation: Secondary | ICD-10-CM

## 2013-10-09 DIAGNOSIS — I6350 Cerebral infarction due to unspecified occlusion or stenosis of unspecified cerebral artery: Secondary | ICD-10-CM

## 2013-10-09 DIAGNOSIS — I482 Chronic atrial fibrillation, unspecified: Secondary | ICD-10-CM

## 2013-10-09 DIAGNOSIS — Z7901 Long term (current) use of anticoagulants: Secondary | ICD-10-CM

## 2013-10-09 LAB — PROTIME-INR
INR: 3.37 — AB (ref <1.50)
PROTHROMBIN TIME: 34.1 s — AB (ref 11.6–15.2)

## 2013-11-09 ENCOUNTER — Other Ambulatory Visit: Payer: Self-pay | Admitting: Family Medicine

## 2013-11-10 ENCOUNTER — Other Ambulatory Visit: Payer: Self-pay | Admitting: Cardiovascular Disease

## 2013-11-10 DIAGNOSIS — Z7901 Long term (current) use of anticoagulants: Secondary | ICD-10-CM | POA: Diagnosis not present

## 2013-11-11 ENCOUNTER — Ambulatory Visit (INDEPENDENT_AMBULATORY_CARE_PROVIDER_SITE_OTHER): Payer: Medicare Other | Admitting: Pharmacist Clinician (PhC)/ Clinical Pharmacy Specialist

## 2013-11-11 DIAGNOSIS — I482 Chronic atrial fibrillation, unspecified: Secondary | ICD-10-CM

## 2013-11-11 DIAGNOSIS — I639 Cerebral infarction, unspecified: Secondary | ICD-10-CM

## 2013-11-11 LAB — PROTIME-INR
INR: 2.37 — ABNORMAL HIGH (ref <1.50)
Prothrombin Time: 25.9 seconds — ABNORMAL HIGH (ref 11.6–15.2)

## 2013-11-30 ENCOUNTER — Other Ambulatory Visit: Payer: Self-pay | Admitting: Cardiovascular Disease

## 2013-12-01 NOTE — Telephone Encounter (Signed)
Rx was sent to pharmacy electronically. 

## 2013-12-08 ENCOUNTER — Other Ambulatory Visit: Payer: Self-pay | Admitting: Cardiovascular Disease

## 2013-12-08 DIAGNOSIS — Z7901 Long term (current) use of anticoagulants: Secondary | ICD-10-CM | POA: Diagnosis not present

## 2013-12-08 LAB — PROTIME-INR
INR: 3.37 — ABNORMAL HIGH (ref <1.50)
PROTHROMBIN TIME: 34.1 s — AB (ref 11.6–15.2)

## 2013-12-09 ENCOUNTER — Ambulatory Visit (INDEPENDENT_AMBULATORY_CARE_PROVIDER_SITE_OTHER): Payer: Medicare Other | Admitting: Pharmacist Clinician (PhC)/ Clinical Pharmacy Specialist

## 2013-12-09 DIAGNOSIS — I482 Chronic atrial fibrillation, unspecified: Secondary | ICD-10-CM

## 2013-12-09 DIAGNOSIS — I639 Cerebral infarction, unspecified: Secondary | ICD-10-CM

## 2013-12-31 ENCOUNTER — Other Ambulatory Visit: Payer: Self-pay | Admitting: Cardiovascular Disease

## 2013-12-31 DIAGNOSIS — Z7901 Long term (current) use of anticoagulants: Secondary | ICD-10-CM | POA: Diagnosis not present

## 2014-01-01 ENCOUNTER — Ambulatory Visit (INDEPENDENT_AMBULATORY_CARE_PROVIDER_SITE_OTHER): Payer: Medicare Other | Admitting: Pharmacist Clinician (PhC)/ Clinical Pharmacy Specialist

## 2014-01-01 DIAGNOSIS — I482 Chronic atrial fibrillation, unspecified: Secondary | ICD-10-CM

## 2014-01-01 DIAGNOSIS — I639 Cerebral infarction, unspecified: Secondary | ICD-10-CM

## 2014-01-01 LAB — PROTIME-INR
INR: 2.95 — AB (ref <1.50)
PROTHROMBIN TIME: 30.7 s — AB (ref 11.6–15.2)

## 2014-01-28 ENCOUNTER — Other Ambulatory Visit: Payer: Self-pay | Admitting: Pharmacist Clinician (PhC)/ Clinical Pharmacy Specialist

## 2014-01-28 NOTE — Telephone Encounter (Signed)
Coumadin Rx Refill

## 2014-02-01 ENCOUNTER — Other Ambulatory Visit: Payer: Self-pay | Admitting: Cardiovascular Disease

## 2014-02-01 DIAGNOSIS — Z7901 Long term (current) use of anticoagulants: Secondary | ICD-10-CM | POA: Diagnosis not present

## 2014-02-02 LAB — PROTIME-INR
INR: 2.52 — ABNORMAL HIGH (ref <1.50)
Prothrombin Time: 27.2 seconds — ABNORMAL HIGH (ref 11.6–15.2)

## 2014-02-03 ENCOUNTER — Ambulatory Visit (INDEPENDENT_AMBULATORY_CARE_PROVIDER_SITE_OTHER): Payer: Medicare Other | Admitting: Pharmacist Clinician (PhC)/ Clinical Pharmacy Specialist

## 2014-02-03 DIAGNOSIS — I639 Cerebral infarction, unspecified: Secondary | ICD-10-CM | POA: Diagnosis not present

## 2014-02-03 DIAGNOSIS — I482 Chronic atrial fibrillation, unspecified: Secondary | ICD-10-CM

## 2014-02-27 ENCOUNTER — Other Ambulatory Visit: Payer: Self-pay | Admitting: Family Medicine

## 2014-03-01 NOTE — Telephone Encounter (Signed)
Patient has not been in since 2014 and was calling patient to schedule an appt but a caregiver answered the phone stating that he has had a stroke and unable to talk and wife doesn't get home til after 6. Does pt still need to come in for an appt or just refill med

## 2014-03-01 NOTE — Telephone Encounter (Signed)
He needs to be set up for appointment. Give him enough medication to cover  until he gets in for an appointment

## 2014-03-01 NOTE — Telephone Encounter (Signed)
Called and left a detailed message on wife cell phone that her husband is due for an appt on one of his medicine and to call and schedule that and we could give him enough to get to his appt.  I will refill for 30 days but if she schedules something after 30 days and needs another refill then we can do that to get to the appt date.

## 2014-03-19 ENCOUNTER — Encounter: Payer: Self-pay | Admitting: Family Medicine

## 2014-03-19 ENCOUNTER — Ambulatory Visit (INDEPENDENT_AMBULATORY_CARE_PROVIDER_SITE_OTHER): Payer: Medicare Other | Admitting: Family Medicine

## 2014-03-19 VITALS — BP 120/80 | HR 87 | Wt 182.0 lb

## 2014-03-19 DIAGNOSIS — E039 Hypothyroidism, unspecified: Secondary | ICD-10-CM

## 2014-03-19 DIAGNOSIS — I639 Cerebral infarction, unspecified: Secondary | ICD-10-CM

## 2014-03-19 DIAGNOSIS — Z8673 Personal history of transient ischemic attack (TIA), and cerebral infarction without residual deficits: Secondary | ICD-10-CM

## 2014-03-19 DIAGNOSIS — M7712 Lateral epicondylitis, left elbow: Secondary | ICD-10-CM | POA: Diagnosis not present

## 2014-03-19 MED ORDER — TRIAMCINOLONE ACETONIDE 40 MG/ML IJ SUSP
20.0000 mg | Freq: Once | INTRAMUSCULAR | Status: AC
  Administered 2014-03-19: 20 mg via INTRAMUSCULAR

## 2014-03-19 NOTE — Progress Notes (Signed)
   Subjective:    Patient ID: Eric Ross, male    DOB: 03-16-1955, 59 y.o.   MRN: [id number]  HPI He complains of a several week history of left elbow pain especially with activity. He has been doing some outside work which makes this worse. He also needs thyroid test. He is on medication and has been stable for several years. He also continues on Coumadin for treatment of his underlying atrial fibrillation and subsequent CVA    Review of Systems     Objective:   Physical Exam Tender to palpation over the left lateral epicondyles with ocular testing causing pain.       Assessment & Plan:  Hypothyroidism, unspecified hypothyroidism type - Plan: TSH  Lateral epicondylitis (tennis elbow), left - Plan: triamcinolone acetonide (KENALOG-40) injection 20 mg  History of CVA (cerebrovascular accident)  I discussed options with him concerning conservative care and he elected to have an injection. The point of maximum tenderness was identified. The area was cleaned with Betadine. 20 mg of Kenalog and 1 mL of Xylocaine was injected into the area being careful to not inject into the tendon. He obtain relatively quick relief of the symptoms. Recommend he do his me things palms up and open as possible.

## 2014-03-20 LAB — TSH: TSH: 0.911 u[IU]/mL (ref 0.350–4.500)

## 2014-03-20 MED ORDER — LEVOTHYROXINE SODIUM 112 MCG PO TABS: ORAL_TABLET | ORAL | Status: DC

## 2014-03-20 NOTE — Addendum Note (Signed)
Addended by: Denita Lung on: 03/20/2014 10:08 AM   Modules accepted: Orders

## 2014-04-06 ENCOUNTER — Other Ambulatory Visit: Payer: Self-pay | Admitting: Cardiovascular Disease

## 2014-04-06 DIAGNOSIS — Z7901 Long term (current) use of anticoagulants: Secondary | ICD-10-CM | POA: Diagnosis not present

## 2014-04-07 LAB — PROTIME-INR
INR: 2.38 — ABNORMAL HIGH (ref <1.50)
PROTHROMBIN TIME: 26 s — AB (ref 11.6–15.2)

## 2014-04-08 ENCOUNTER — Ambulatory Visit (INDEPENDENT_AMBULATORY_CARE_PROVIDER_SITE_OTHER): Payer: Medicare Other | Admitting: Pharmacist Clinician (PhC)/ Clinical Pharmacy Specialist

## 2014-04-08 DIAGNOSIS — Z8673 Personal history of transient ischemic attack (TIA), and cerebral infarction without residual deficits: Secondary | ICD-10-CM

## 2014-04-08 DIAGNOSIS — I482 Chronic atrial fibrillation, unspecified: Secondary | ICD-10-CM

## 2014-04-23 ENCOUNTER — Other Ambulatory Visit: Payer: Self-pay | Admitting: Pharmacist Clinician (PhC)/ Clinical Pharmacy Specialist

## 2014-04-23 DIAGNOSIS — Z7901 Long term (current) use of anticoagulants: Secondary | ICD-10-CM

## 2014-04-23 DIAGNOSIS — I482 Chronic atrial fibrillation, unspecified: Secondary | ICD-10-CM

## 2014-04-26 ENCOUNTER — Telehealth: Payer: Self-pay | Admitting: Cardiovascular Disease

## 2014-04-26 NOTE — Telephone Encounter (Signed)
Paul from [facility] called stating ereq numbers won't cross through Epic d/t way standing orders are set up, asked if OK to fax results. Advised OK.

## 2014-05-06 ENCOUNTER — Other Ambulatory Visit: Payer: Self-pay | Admitting: Family Medicine

## 2014-05-14 ENCOUNTER — Other Ambulatory Visit: Payer: Self-pay | Admitting: Cardiovascular Disease

## 2014-05-14 DIAGNOSIS — Z7901 Long term (current) use of anticoagulants: Secondary | ICD-10-CM | POA: Diagnosis not present

## 2014-05-14 DIAGNOSIS — I482 Chronic atrial fibrillation: Secondary | ICD-10-CM | POA: Diagnosis not present

## 2014-05-15 LAB — PROTIME-INR
INR: 2.5 — ABNORMAL HIGH (ref <1.50)
Prothrombin Time: 27 seconds — ABNORMAL HIGH (ref 11.6–15.2)

## 2014-05-16 ENCOUNTER — Emergency Department (HOSPITAL_BASED_OUTPATIENT_CLINIC_OR_DEPARTMENT_OTHER)
Admission: EM | Admit: 2014-05-16 | Discharge: 2014-05-16 | Disposition: A | Discharge status: Home / Self Care | Payer: Medicare Other | Attending: Emergency Medicine | Admitting: Emergency Medicine

## 2014-05-16 ENCOUNTER — Emergency Department (HOSPITAL_BASED_OUTPATIENT_CLINIC_OR_DEPARTMENT_OTHER): Payer: Medicare Other

## 2014-05-16 ENCOUNTER — Encounter (HOSPITAL_BASED_OUTPATIENT_CLINIC_OR_DEPARTMENT_OTHER): Payer: Self-pay | Admitting: Emergency Medicine

## 2014-05-16 DIAGNOSIS — Z85850 Personal history of malignant neoplasm of thyroid: Secondary | ICD-10-CM | POA: Insufficient documentation

## 2014-05-16 DIAGNOSIS — E039 Hypothyroidism, unspecified: Secondary | ICD-10-CM | POA: Diagnosis not present

## 2014-05-16 DIAGNOSIS — Z87438 Personal history of other diseases of male genital organs: Secondary | ICD-10-CM | POA: Insufficient documentation

## 2014-05-16 DIAGNOSIS — Z8673 Personal history of transient ischemic attack (TIA), and cerebral infarction without residual deficits: Secondary | ICD-10-CM | POA: Insufficient documentation

## 2014-05-16 DIAGNOSIS — Z880 Allergy status to penicillin: Secondary | ICD-10-CM | POA: Insufficient documentation

## 2014-05-16 DIAGNOSIS — E785 Hyperlipidemia, unspecified: Secondary | ICD-10-CM | POA: Diagnosis not present

## 2014-05-16 DIAGNOSIS — M545 Low back pain: Secondary | ICD-10-CM | POA: Diagnosis not present

## 2014-05-16 DIAGNOSIS — I4891 Unspecified atrial fibrillation: Secondary | ICD-10-CM | POA: Diagnosis not present

## 2014-05-16 DIAGNOSIS — M5136 Other intervertebral disc degeneration, lumbar region: Secondary | ICD-10-CM | POA: Diagnosis not present

## 2014-05-16 DIAGNOSIS — Z79899 Other long term (current) drug therapy: Secondary | ICD-10-CM | POA: Insufficient documentation

## 2014-05-16 DIAGNOSIS — Z7901 Long term (current) use of anticoagulants: Secondary | ICD-10-CM | POA: Diagnosis not present

## 2014-05-16 DIAGNOSIS — M5489 Other dorsalgia: Secondary | ICD-10-CM

## 2014-05-16 DIAGNOSIS — M47816 Spondylosis without myelopathy or radiculopathy, lumbar region: Secondary | ICD-10-CM | POA: Diagnosis not present

## 2014-05-16 LAB — CBC WITH DIFFERENTIAL/PLATELET
BASOS PCT: 1 % (ref 0–1)
Basophils Absolute: 0 10*3/uL (ref 0.0–0.1)
Eosinophils Absolute: 0.2 10*3/uL (ref 0.0–0.7)
Eosinophils Relative: 2 % (ref 0–5)
HCT: 45.1 % (ref 39.0–52.0)
Hemoglobin: 15.4 g/dL (ref 13.0–17.0)
Lymphocytes Relative: 20 % (ref 12–46)
Lymphs Abs: 1.7 10*3/uL (ref 0.7–4.0)
MCH: 29.7 pg (ref 26.0–34.0)
MCHC: 34.1 g/dL (ref 30.0–36.0)
MCV: 86.9 fL (ref 78.0–100.0)
MONO ABS: 0.5 10*3/uL (ref 0.1–1.0)
MONOS PCT: 6 % (ref 3–12)
NEUTROS ABS: 6.1 10*3/uL (ref 1.7–7.7)
Neutrophils Relative %: 71 % (ref 43–77)
PLATELETS: 192 10*3/uL (ref 150–400)
RBC: 5.19 MIL/uL (ref 4.22–5.81)
RDW: 12.5 % (ref 11.5–15.5)
WBC: 8.5 10*3/uL (ref 4.0–10.5)

## 2014-05-16 LAB — BASIC METABOLIC PANEL
Anion gap: 7 (ref 5–15)
BUN: 15 mg/dL (ref 6–23)
CHLORIDE: 104 mmol/L (ref 96–112)
CO2: 24 mmol/L (ref 19–32)
Calcium: 9.1 mg/dL (ref 8.4–10.5)
Creatinine, Ser: 1.01 mg/dL (ref 0.50–1.35)
GFR calc Af Amer: >90 mL/min (ref >90)
GFR calc non Af Amer: 79 mL/min — ABNORMAL LOW (ref >90)
Glucose, Bld: 102 mg/dL — ABNORMAL HIGH (ref 70–99)
POTASSIUM: 4.3 mmol/L (ref 3.5–5.1)
Sodium: 135 mmol/L (ref 135–145)

## 2014-05-16 LAB — PROTIME-INR
INR: 2.59 — AB (ref 0.00–1.49)
PROTHROMBIN TIME: 27.8 s — AB (ref 11.6–15.2)

## 2014-05-16 MED ORDER — OXYCODONE-ACETAMINOPHEN 5-325 MG PO TABS: 1.0000 | ORAL_TABLET | Freq: Four times a day (QID) | ORAL | Status: DC | PRN

## 2014-05-16 MED ORDER — HYDROMORPHONE HCL 1 MG/ML IJ SOLN
1.0000 mg | Freq: Once | INTRAMUSCULAR | Status: DC
  Filled 2014-05-16: qty 1

## 2014-05-16 MED ORDER — HYDROMORPHONE HCL 1 MG/ML IJ SOLN
1.0000 mg | Freq: Once | INTRAMUSCULAR | Status: AC
  Administered 2014-05-16: 1 mg via INTRAVENOUS

## 2014-05-16 NOTE — Discharge Instructions (Signed)
Back Pain, Adult °Back pain is very common. The pain often gets better over time. The cause of back pain is usually not dangerous. Most people can learn to manage their back pain on their own.  °HOME CARE  °· Stay active. Start with short walks on flat ground if you can. Try to walk farther each day. °· Do not sit, drive, or stand in one place for more than 30 minutes. Do not stay in bed. °· Do not avoid exercise or work. Activity can help your back heal faster. °· Be careful when you bend or lift an object. Bend at your knees, keep the object close to you, and do not twist. °· Sleep on a firm mattress. Lie on your side, and bend your knees. If you lie on your back, put a pillow under your knees. °· Only take medicines as told by your doctor. °· Put ice on the injured area. °¨ Put ice in a plastic bag. °¨ Place a towel between your skin and the bag. °¨ Leave the ice on for 15-20 minutes, 03-04 times a day for the first 2 to 3 days. After that, you can switch between ice and heat packs. °· Ask your doctor about back exercises or massage. °· Avoid feeling anxious or stressed. Find good ways to deal with stress, such as exercise. °GET HELP RIGHT AWAY IF:  °· Your pain does not go away with rest or medicine. °· Your pain does not go away in 1 week. °· You have new problems. °· You do not feel well. °· The pain spreads into your legs. °· You cannot control when you poop (bowel movement) or pee (urinate). °· Your arms or legs feel weak or lose feeling (numbness). °· You feel sick to your stomach (nauseous) or throw up (vomit). °· You have belly (abdominal) pain. °· You feel like you may pass out (faint). °MAKE SURE YOU:  °· Understand these instructions. °· Will watch your condition. °· Will get help right away if you are not doing well or get worse. °Document Released: 07/04/2007 Document Revised: 04/09/2011 Document Reviewed: 05/19/2013 °Exit[employer]® Patient Information ©2015 [employer]. This information is not intended  to replace advice given to you by your health care provider. Make sure you discuss any questions you have with your health care provider. ° °

## 2014-05-16 NOTE — ED Notes (Signed)
Pt states chronic back pain off and on over the years, with no h/o back surgery or rx treatment.  Pt states pain in past 3 days dramatically worsened and has done a lot of physical activity through yard work and "moxing the lawn".  Pt states treated with ibuprofen and tylenol as well as heating pads at home with minmal relief.  Pt lower bilateral back tender to palpation and with movement.

## 2014-05-16 NOTE — ED Provider Notes (Signed)
CSN: [id number]     Arrival date & time 05/16/14  0915 History   First MD Initiated Contact with Patient 05/16/14 [id number]     Chief Complaint  Patient presents with  . Back Pain     (Consider location/radiation/quality/duration/timing/severity/associated sxs/prior Treatment) Patient is a 59 y.o. male presenting with back pain. The history is provided by the patient.  Back Pain Location:  Lumbar spine Quality:  Aching Radiates to:  Does not radiate Pain severity:  Moderate Pain is:  Same all the time Onset quality:  Gradual Duration:  1 week Timing:  Constant Progression:  Worsening Chronicity:  Recurrent Context comment:  Mowing the lawn Relieved by:  Nothing Worsened by:  Bending Ineffective treatments:  NSAIDs Associated symptoms: no abdominal pain, no chest pain, no dysuria, no fever, no headaches and no numbness     Past Medical History  Diagnosis Date  . Cardiomyopathy     nonischemic  . History of thyroid cancer   . Hypothyroidism   . Pseudogout   . Erectile dysfunction   . Atrial fibrillation     Dr. Gwenlyn Found  . Stroke 2003  . Hyperlipidemia    Past Surgical History  Procedure Laterality Date  . Joint replacement      BILATERAL KNEE   . Thyroidectomy    . Transthoracic echocardiogram  05/30/11    EF%=45-50%. NORMAL LV WALL THICKNESS. RV IS MILDLY DILATED. NO SIGNIFICANT VALVE DISEASE.   [name] Lexiscan myocardial perfusion study  05/30/11    NORMAL MYOCARDIAL PERFUSION STUDY EF% 49%.  . Cardiac catheterization  09/22/01    NORMAL CORONARY ARTERIES   Family History  Problem Relation Age of Onset  . Cancer Mother     thyroid  . Cancer Sister   . Stroke Brother    History  Substance Use Topics  . Smoking status: Never Smoker   . Smokeless tobacco: Current User    Types: Chew  . Alcohol Use: No    Review of Systems  Constitutional: Negative for fever.  HENT: Negative for drooling and rhinorrhea.   Eyes: Negative for pain.  Respiratory: Negative for cough  and shortness of breath.   Cardiovascular: Negative for chest pain and leg swelling.  Gastrointestinal: Negative for nausea, vomiting, abdominal pain and diarrhea.  Genitourinary: Negative for dysuria and hematuria.  Musculoskeletal: Positive for back pain. Negative for gait problem and neck pain.  Skin: Negative for color change.  Neurological: Negative for numbness and headaches.  Hematological: Negative for adenopathy.  Psychiatric/Behavioral: Negative for behavioral problems.  All other systems reviewed and are negative.     Allergies  Penicillins  Home Medications   Prior to Admission medications   Medication Sig Start Date End Date Taking? Authorizing Provider  carvedilol (COREG) 12.5 MG tablet TAKE 1 TABLET BY MOUTH TWICE A DAY 05/06/14   Denita Lung, MD  CIALIS 20 MG tablet TAKE 1 TABLET (20 MG TOTAL) BY MOUTH DAILY AS NEEDED FOR ERECTILE DYSFUNCTION. 07/22/13   Denita Lung, MD  fluticasone (VERAMYST) 27.5 MCG/SPRAY nasal spray Place 1 spray into the nose as needed. 12/14/10 09/30/13  Camelia Eng Tysinger, PA-C  levothyroxine (SYNTHROID, LEVOTHROID) 112 MCG tablet TAKE 1 TABLET (112 MCG TOTAL) BY MOUTH DAILY. 03/20/14   Denita Lung, MD  pantoprazole (PROTONIX) 40 MG tablet TAKE 1 TABLET (40 MG TOTAL) BY MOUTH DAILY. 12/01/13   Lorretta Harp, MD  ramipril (ALTACE) 2.5 MG capsule TAKE 1 CAPSULE EVERY DAY 06/25/12   Denita Lung, MD  simvastatin (ZOCOR) 20 MG tablet Take 1 tablet (20 mg total) by mouth daily. 12/01/13   Lorretta Harp, MD  warfarin (COUMADIN) 5 MG tablet TAKE 1 TABLET BY MOUTH DAILY OR AS DIRECTED 01/28/14   Lorretta Harp, MD   BP 111/71 mmHg  Pulse 96  Temp(Src) 97.9 F (36.6 C) (Oral)  Resp 20  Ht 6\' 1"  (1.854 m)  Wt 178 lb (80.74 kg)  BMI 23.49 kg/m2  SpO2 99% Physical Exam  Constitutional: He is oriented to person, place, and time. He appears well-developed and well-nourished.  HENT:  Head: Normocephalic and atraumatic.  Right Ear: External  ear normal.  Left Ear: External ear normal.  Nose: Nose normal.  Mouth/Throat: Oropharynx is clear and moist. No oropharyngeal exudate.  Eyes: Conjunctivae and EOM are normal. Pupils are equal, round, and reactive to light.  Neck: Normal range of motion. Neck supple.  Cardiovascular: Normal rate, regular rhythm, normal heart sounds and intact distal pulses.  Exam reveals no gallop and no friction rub.   No murmur heard. Pulmonary/Chest: Effort normal and breath sounds normal. No respiratory distress. He has no wheezes.  Abdominal: Soft. Bowel sounds are normal. He exhibits no distension. There is no tenderness. There is no rebound and no guarding.  Musculoskeletal: Normal range of motion. He exhibits no edema or tenderness.  Focal tenderness of the lumbar spine.  5 out of 5 strength in left upper and left lower extremity. 4-5 strength in right upper and right lower extremity which is at baseline.  The patient is ambulatory as baseline.  Neurological: He is alert and oriented to person, place, and time.  Skin: Skin is warm and dry.  Psychiatric: He has a normal mood and affect. His behavior is normal.  Nursing note and vitals reviewed.   ED Course  Procedures (including critical care time) Labs Review Labs Reviewed - No data to display  Imaging Review Dg Lumbar Spine Complete  05/16/2014   CLINICAL DATA:  Chronic lower back pain for multiple years. No back surgery. Pain is worsened over the last 3 days.  EXAM: LUMBAR SPINE - COMPLETE 4+ VIEW  COMPARISON:  None.  FINDINGS: Normal anatomic alignment of the lumbar spine. Multilevel degenerative disc disease most pronounced L1-2, L4-5 and L5-S1. There is age-indeterminate anterior height loss of the L1 and L2 vertebral bodies. L4-5 and L5-S1 facet degenerative changes. The SI joints are unremarkable. Visualized bowel gas pattern is unremarkable.  IMPRESSION: Age-indeterminate anterior height loss of the L1 and L2 vertebral bodies. Recommend  correlation for point tenderness.  Multilevel degenerative disc and facet disease.   Electronically Signed   By: Lovey Newcomer M.D.   On: 05/16/2014 10:59     EKG Interpretation None      MDM   Final diagnoses:  Back pain without sciatica    10:08 AM 59 y.o. male with history of stroke and right-sided weakness, A. fib on Coumadin, who presents with worsening lower back pain over the last week. 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 He states that he is been mowing the lawn and believes he may have hurt his back doing this. He denies any fevers, weakness, numbness, or bowel or bladder incontinence. He is on Coumadin. Pain is focal in his lumbar spine and reproducible on exam. Do not think this is a AAA. Ultrasound from 2007 reviewed which showed no evidence of AAA. We'll get screening lab work and check  INR. Will get x-ray of back and pain control. Likely musculoskeletal.  11:39 AM: I interpreted/reviewed the labs and/or imaging which were non-contributory.  I have discussed the diagnosis/risks/treatment options with the patient and believe the pt to be eligible for discharge home to follow-up with his pcp. We also discussed returning to the ED immediately if new or worsening sx occur. We discussed the sx which are most concerning (e.g., worsening pain, fever, weakness, numbness) that necessitate immediate return. Medications administered to the patient during their visit and any new prescriptions provided to the patient are listed below.  Medications given during this visit Medications  HYDROmorphone (DILAUDID) injection 1 mg (1 mg Intravenous Given 05/16/14 1029)    New Prescriptions   No medications on file     Pamella Pert, MD 05/16/14 1553

## 2014-05-17 ENCOUNTER — Ambulatory Visit (INDEPENDENT_AMBULATORY_CARE_PROVIDER_SITE_OTHER): Payer: Medicare Other | Admitting: Pharmacist Clinician (PhC)/ Clinical Pharmacy Specialist

## 2014-05-17 DIAGNOSIS — I482 Chronic atrial fibrillation, unspecified: Secondary | ICD-10-CM

## 2014-05-17 DIAGNOSIS — Z8673 Personal history of transient ischemic attack (TIA), and cerebral infarction without residual deficits: Secondary | ICD-10-CM

## 2014-06-16 ENCOUNTER — Other Ambulatory Visit: Payer: Self-pay | Admitting: Cardiovascular Disease

## 2014-06-16 DIAGNOSIS — Z7901 Long term (current) use of anticoagulants: Secondary | ICD-10-CM | POA: Diagnosis not present

## 2014-06-16 DIAGNOSIS — I482 Chronic atrial fibrillation: Secondary | ICD-10-CM | POA: Diagnosis not present

## 2014-06-17 ENCOUNTER — Ambulatory Visit (INDEPENDENT_AMBULATORY_CARE_PROVIDER_SITE_OTHER): Payer: Medicare Other | Admitting: Pharmacist Clinician (PhC)/ Clinical Pharmacy Specialist

## 2014-06-17 DIAGNOSIS — I482 Chronic atrial fibrillation, unspecified: Secondary | ICD-10-CM

## 2014-06-17 DIAGNOSIS — Z8673 Personal history of transient ischemic attack (TIA), and cerebral infarction without residual deficits: Secondary | ICD-10-CM

## 2014-06-17 LAB — PROTIME-INR
INR: 2.73 — ABNORMAL HIGH (ref <1.50)
Prothrombin Time: 28.9 seconds — ABNORMAL HIGH (ref 11.6–15.2)

## 2014-07-12 ENCOUNTER — Other Ambulatory Visit: Payer: Self-pay | Admitting: Cardiovascular Disease

## 2014-07-12 DIAGNOSIS — I482 Chronic atrial fibrillation: Secondary | ICD-10-CM | POA: Diagnosis not present

## 2014-07-12 DIAGNOSIS — Z7901 Long term (current) use of anticoagulants: Secondary | ICD-10-CM | POA: Diagnosis not present

## 2014-07-13 ENCOUNTER — Ambulatory Visit (INDEPENDENT_AMBULATORY_CARE_PROVIDER_SITE_OTHER): Payer: Medicare Other | Admitting: Family Medicine

## 2014-07-13 ENCOUNTER — Encounter: Payer: Self-pay | Admitting: Family Medicine

## 2014-07-13 ENCOUNTER — Ambulatory Visit (INDEPENDENT_AMBULATORY_CARE_PROVIDER_SITE_OTHER): Payer: Medicare Other | Admitting: Pharmacist Clinician (PhC)/ Clinical Pharmacy Specialist

## 2014-07-13 VITALS — BP 110/80 | HR 66 | Wt 181.0 lb

## 2014-07-13 DIAGNOSIS — Z7901 Long term (current) use of anticoagulants: Secondary | ICD-10-CM | POA: Diagnosis not present

## 2014-07-13 DIAGNOSIS — M545 Low back pain, unspecified: Secondary | ICD-10-CM

## 2014-07-13 DIAGNOSIS — I482 Chronic atrial fibrillation, unspecified: Secondary | ICD-10-CM

## 2014-07-13 DIAGNOSIS — I639 Cerebral infarction, unspecified: Secondary | ICD-10-CM | POA: Diagnosis not present

## 2014-07-13 DIAGNOSIS — Z8673 Personal history of transient ischemic attack (TIA), and cerebral infarction without residual deficits: Secondary | ICD-10-CM

## 2014-07-13 LAB — PROTIME-INR
INR: 2.82 — ABNORMAL HIGH (ref <1.50)
Prothrombin Time: 29.7 seconds — ABNORMAL HIGH (ref 11.6–15.2)

## 2014-07-13 NOTE — Progress Notes (Signed)
   Subjective:    Patient ID: Eric Ross, male    DOB: 1955/10/03, 59 y.o.   MRN: [id number]  HPI He is here for reevaluation of back pain. He was seen in the emergency room in April for back pain. He continues to have difficulty. He does not complain of any radiation of the pain but does note difficulty especially when he rides his lawnmower.   Review of Systems     Objective:   Physical Exam Alert and in no distress. Back exam does show some slight tenderness in the upper lumbar area with good motion of the back. Normal hip motion. Negative straight leg raising. Normal DTRs.       Assessment & Plan:  Midline low back pain without sciatica - Plan: Ambulatory referral to Physical Therapy  Long term current use of anticoagulant therapy I discussed options with him concerning physical therapy versus referral to orthopedics. Since he really is having no radicular symptoms, I think physical therapy is appropriate. We'll place him on ibuprofen. Cautioned about this and Coumadin and recommend more week with Coumadin testing.Recheck here in 6 weeks.

## 2014-07-13 NOTE — Patient Instructions (Signed)
4 ibuprofen 3 times per day. Heat to the back for 20 minutes 3 times per day. Proper posturing

## 2014-07-21 ENCOUNTER — Other Ambulatory Visit: Payer: Self-pay | Admitting: Cardiovascular Disease

## 2014-08-12 ENCOUNTER — Other Ambulatory Visit: Payer: Self-pay | Admitting: Cardiovascular Disease

## 2014-08-12 DIAGNOSIS — Z7901 Long term (current) use of anticoagulants: Secondary | ICD-10-CM | POA: Diagnosis not present

## 2014-08-12 DIAGNOSIS — I482 Chronic atrial fibrillation: Secondary | ICD-10-CM | POA: Diagnosis not present

## 2014-08-13 ENCOUNTER — Ambulatory Visit (INDEPENDENT_AMBULATORY_CARE_PROVIDER_SITE_OTHER): Payer: Medicare Other | Admitting: Pharmacist Clinician (PhC)/ Clinical Pharmacy Specialist

## 2014-08-13 DIAGNOSIS — Z7901 Long term (current) use of anticoagulants: Secondary | ICD-10-CM

## 2014-08-13 DIAGNOSIS — Z8673 Personal history of transient ischemic attack (TIA), and cerebral infarction without residual deficits: Secondary | ICD-10-CM

## 2014-08-13 DIAGNOSIS — I482 Chronic atrial fibrillation, unspecified: Secondary | ICD-10-CM

## 2014-08-13 LAB — PROTIME-INR
INR: 2.36 — ABNORMAL HIGH (ref <1.50)
Prothrombin Time: 25.8 seconds — ABNORMAL HIGH (ref 11.6–15.2)

## 2014-08-28 ENCOUNTER — Other Ambulatory Visit: Payer: Self-pay | Admitting: Cardiovascular Disease

## 2014-08-30 NOTE — Telephone Encounter (Signed)
REFILL 

## 2014-08-31 ENCOUNTER — Other Ambulatory Visit: Payer: Self-pay | Admitting: *Deleted

## 2014-09-06 ENCOUNTER — Other Ambulatory Visit: Payer: Self-pay | Admitting: *Deleted

## 2014-09-06 MED ORDER — SIMVASTATIN 20 MG PO TABS: 20.0000 mg | ORAL_TABLET | Freq: Every day | ORAL | Status: DC

## 2014-09-21 ENCOUNTER — Other Ambulatory Visit: Payer: Self-pay | Admitting: Cardiovascular Disease

## 2014-09-21 DIAGNOSIS — Z7901 Long term (current) use of anticoagulants: Secondary | ICD-10-CM | POA: Diagnosis not present

## 2014-09-21 DIAGNOSIS — I482 Chronic atrial fibrillation: Secondary | ICD-10-CM | POA: Diagnosis not present

## 2014-09-22 ENCOUNTER — Ambulatory Visit (INDEPENDENT_AMBULATORY_CARE_PROVIDER_SITE_OTHER): Payer: Medicare Other | Admitting: Pharmacist Clinician (PhC)/ Clinical Pharmacy Specialist

## 2014-09-22 DIAGNOSIS — I482 Chronic atrial fibrillation, unspecified: Secondary | ICD-10-CM

## 2014-09-22 DIAGNOSIS — Z8673 Personal history of transient ischemic attack (TIA), and cerebral infarction without residual deficits: Secondary | ICD-10-CM

## 2014-09-22 DIAGNOSIS — Z7901 Long term (current) use of anticoagulants: Secondary | ICD-10-CM

## 2014-09-22 LAB — PROTIME-INR
INR: 2.82 — AB (ref <1.50)
PROTHROMBIN TIME: 29.7 s — AB (ref 11.6–15.2)

## 2014-10-04 DIAGNOSIS — Z23 Encounter for immunization: Secondary | ICD-10-CM | POA: Diagnosis not present

## 2014-10-15 ENCOUNTER — Other Ambulatory Visit: Payer: Self-pay | Admitting: Family Medicine

## 2014-10-26 ENCOUNTER — Other Ambulatory Visit: Payer: Self-pay | Admitting: Cardiovascular Disease

## 2014-10-26 ENCOUNTER — Other Ambulatory Visit: Payer: Self-pay | Admitting: Family Medicine

## 2014-10-26 NOTE — Telephone Encounter (Signed)
Rx request sent to pharmacy.  

## 2014-10-28 ENCOUNTER — Other Ambulatory Visit: Payer: Self-pay | Admitting: Cardiovascular Disease

## 2014-10-28 DIAGNOSIS — I482 Chronic atrial fibrillation: Secondary | ICD-10-CM | POA: Diagnosis not present

## 2014-10-28 DIAGNOSIS — Z7901 Long term (current) use of anticoagulants: Secondary | ICD-10-CM | POA: Diagnosis not present

## 2014-10-29 ENCOUNTER — Ambulatory Visit (INDEPENDENT_AMBULATORY_CARE_PROVIDER_SITE_OTHER): Payer: Medicare Other | Admitting: Pharmacist Clinician (PhC)/ Clinical Pharmacy Specialist

## 2014-10-29 DIAGNOSIS — I482 Chronic atrial fibrillation, unspecified: Secondary | ICD-10-CM

## 2014-10-29 DIAGNOSIS — Z8673 Personal history of transient ischemic attack (TIA), and cerebral infarction without residual deficits: Secondary | ICD-10-CM

## 2014-10-29 DIAGNOSIS — Z7901 Long term (current) use of anticoagulants: Secondary | ICD-10-CM

## 2014-10-29 LAB — PROTIME-INR
INR: 2.48 — ABNORMAL HIGH (ref <1.50)
Prothrombin Time: 27.2 seconds — ABNORMAL HIGH (ref 11.6–15.2)

## 2014-11-03 ENCOUNTER — Encounter: Payer: Self-pay | Admitting: Cardiovascular Disease

## 2014-11-03 ENCOUNTER — Ambulatory Visit (INDEPENDENT_AMBULATORY_CARE_PROVIDER_SITE_OTHER): Payer: Medicare Other | Admitting: Cardiovascular Disease

## 2014-11-03 VITALS — BP 112/70 | HR 76 | Ht 74.0 in | Wt 184.0 lb

## 2014-11-03 DIAGNOSIS — I482 Chronic atrial fibrillation, unspecified: Secondary | ICD-10-CM

## 2014-11-03 DIAGNOSIS — E785 Hyperlipidemia, unspecified: Secondary | ICD-10-CM | POA: Diagnosis not present

## 2014-11-03 DIAGNOSIS — E782 Mixed hyperlipidemia: Secondary | ICD-10-CM | POA: Insufficient documentation

## 2014-11-03 DIAGNOSIS — I10 Essential (primary) hypertension: Secondary | ICD-10-CM

## 2014-11-03 DIAGNOSIS — I639 Cerebral infarction, unspecified: Secondary | ICD-10-CM | POA: Diagnosis not present

## 2014-11-03 NOTE — Patient Instructions (Signed)
Medication Instructions:  Your physician recommends that you continue on your current medications as directed. Please refer to the Current Medication list given to you today.   Labwork: Your physician recommends that you return for lab work in: FASTING (LIPID/LIVER)   Testing/Procedures: none  Follow-Up: Your physician wants you to follow-up in: 33 MONTHS with DR. Gwenlyn Found. You will receive a reminder letter in the mail two months in advance. If you don't receive a letter, please call our office to schedule the follow-up appointment.   Any Other Special Instructions Will Be Listed Below (If Applicable).

## 2014-11-03 NOTE — Assessment & Plan Note (Signed)
History of chronic atrial fibrillation rate controlled on Coumadin anticoagulation. 

## 2014-11-03 NOTE — Assessment & Plan Note (Signed)
History of hyperlipidemia on simvastatin 20 mg. We will recheck a lipid and liver profile

## 2014-11-03 NOTE — Progress Notes (Signed)
11/03/2014 Eric Ross   1955-09-09  [id number]  Primary Physician Wyatt Haste, MD Primary Cardiologist: Lorretta Harp MD Renae Gloss   HPI:  Eric Ross is a 59 year old thin appearing married Caucasian male father of 2, grandfather and 3 grandchildren who is accompanied by his wife Eric Ross today. I last saw him 09/30/13. He has a history of nonischemic cardiomyopathy cath performed in 2003 that showed normal coronary arteries. He has an ejection fraction in the 45-50% range. He does have chronic A. Fib on Coumadin anticoagulation rate controlled. His other problems include hyperlipidemia on statin therapy and treated hypertension. He does get occasional chest burning and a Myoview stress test performed 05/30/11 which was normal. Since I saw him a year ago he complains of occasional atypical chest pain and some tachycardia palpitations.   Current Outpatient Prescriptions  Medication Sig Dispense Refill  . carvedilol (COREG) 12.5 MG tablet TAKE 1 TABLET BY MOUTH TWICE A DAY 180 tablet 0  . CIALIS 20 MG tablet TAKE 1 TABLET (20 MG TOTAL) BY MOUTH DAILY AS NEEDED FOR ERECTILE DYSFUNCTION. 5 tablet 10  . levothyroxine (SYNTHROID, LEVOTHROID) 112 MCG tablet TAKE 1 TABLET (112 MCG TOTAL) BY MOUTH DAILY. 90 tablet 3  . oxyCODONE-acetaminophen (PERCOCET) 5-325 MG per tablet Take 1-2 tablets by mouth every 6 (six) hours as needed. 20 tablet 0  . pantoprazole (PROTONIX) 40 MG tablet TAKE 1 TABLET (40 MG TOTAL) BY MOUTH DAILY. 90 tablet 0  . ramipril (ALTACE) 2.5 MG capsule TAKE ONE CAPSULE BY MOUTH EVERY DAY 90 capsule 4  . simvastatin (ZOCOR) 20 MG tablet Take 1 tablet (20 mg total) by mouth daily at 6 PM. NEED OV. 90 tablet 0  . warfarin (COUMADIN) 5 MG tablet TAKE 1 TABLET BY MOUTH DAILY OR AS DIRECTED 90 tablet 1  . fluticasone (VERAMYST) 27.5 MCG/SPRAY nasal spray Place 1 spray into the nose as needed.     No current facility-administered medications for this visit.     Allergies  Allergen Reactions  . Penicillins     REACTION: Paralyzed him @ 59 yrs old    Social History   Social History  . Marital Status: Married    Spouse Name: N/A  . Number of Children: N/A  . Years of Education: N/A   Occupational History  . Not on file.   Social History Main Topics  . Smoking status: Never Smoker   . Smokeless tobacco: Current User    Types: Chew  . Alcohol Use: No  . Drug Use: No  . Sexual Activity: Not on file   Other Topics Concern  . Not on file   Social History Narrative     Review of Systems: General: negative for chills, fever, night sweats or weight changes.  Cardiovascular: negative for chest pain, dyspnea on exertion, edema, orthopnea, palpitations, paroxysmal nocturnal dyspnea or shortness of breath Dermatological: negative for rash Respiratory: negative for cough or wheezing Urologic: negative for hematuria Abdominal: negative for nausea, vomiting, diarrhea, bright red blood per rectum, melena, or hematemesis Neurologic: negative for visual changes, syncope, or dizziness All other systems reviewed and are otherwise negative except as noted above.    Blood pressure 112/70, pulse 76, height 6\' 2"  (1.88 m), weight 184 lb (83.462 kg).  General appearance: alert and no distress Neck: no adenopathy, no carotid bruit, no JVD, supple, symmetrical, trachea midline and thyroid not enlarged, symmetric, no tenderness/mass/nodules Lungs: clear to auscultation bilaterally Heart: irregularly irregular rhythm Extremities: extremities normal, atraumatic, no  cyanosis or edema  EKG atrial fibrillation with a ventricular response of 76 and nonspecific ST-T wave changes. I preservative this EKG  ASSESSMENT AND PLAN:   Essential hypertension History of hypertension with blood pressure measured at 112/70. He is on carvedilol and ramipril. Continue current meds at current dosing  Atrial fibrillation History of chronic atrial fibrillation rate  controlled on Coumadin anticoagulation  Hyperlipidemia History of hyperlipidemia on simvastatin 20 mg. We will recheck a lipid and liver profile      Lorretta Harp MD [facility], [facility] 11/03/2014 11:55 AM

## 2014-11-03 NOTE — Assessment & Plan Note (Signed)
History of hypertension with blood pressure measured at 112/70. He is on carvedilol and ramipril. Continue current meds at current dosing

## 2014-11-04 ENCOUNTER — Telehealth: Payer: Self-pay

## 2014-11-04 DIAGNOSIS — Z7901 Long term (current) use of anticoagulants: Secondary | ICD-10-CM

## 2014-11-04 DIAGNOSIS — Z79899 Other long term (current) drug therapy: Secondary | ICD-10-CM

## 2014-11-04 DIAGNOSIS — I482 Chronic atrial fibrillation, unspecified: Secondary | ICD-10-CM

## 2014-11-04 NOTE — Telephone Encounter (Signed)
Spoke with paul at [facility] in [place], he needed updated standing orders for pt to get PT/INR draws. Pt orders released and he verified that pt information was sent and pt is good for 6 months.

## 2014-11-08 ENCOUNTER — Telehealth: Payer: Self-pay

## 2014-11-08 NOTE — Telephone Encounter (Signed)
Pt short term disability paperwork completed and faxed to [employer] per pt request. Copy mailed to pt for his records.

## 2014-11-25 ENCOUNTER — Other Ambulatory Visit: Payer: Self-pay | Admitting: Family Medicine

## 2014-11-25 NOTE — Telephone Encounter (Signed)
Is this ok to refill?  

## 2014-12-01 ENCOUNTER — Other Ambulatory Visit: Payer: Self-pay | Admitting: Cardiovascular Disease

## 2014-12-31 DIAGNOSIS — I482 Chronic atrial fibrillation: Secondary | ICD-10-CM | POA: Diagnosis not present

## 2014-12-31 DIAGNOSIS — Z7901 Long term (current) use of anticoagulants: Secondary | ICD-10-CM | POA: Diagnosis not present

## 2014-12-31 DIAGNOSIS — I4891 Unspecified atrial fibrillation: Secondary | ICD-10-CM | POA: Diagnosis not present

## 2015-01-01 LAB — PROTIME-INR
INR: 2.79 — AB (ref <1.50)
Prothrombin Time: 29.8 seconds — ABNORMAL HIGH (ref 11.6–15.2)

## 2015-01-03 ENCOUNTER — Ambulatory Visit (INDEPENDENT_AMBULATORY_CARE_PROVIDER_SITE_OTHER): Payer: Medicare Other | Admitting: Pharmacist Clinician (PhC)/ Clinical Pharmacy Specialist

## 2015-01-03 DIAGNOSIS — I482 Chronic atrial fibrillation, unspecified: Secondary | ICD-10-CM

## 2015-01-03 DIAGNOSIS — Z8673 Personal history of transient ischemic attack (TIA), and cerebral infarction without residual deficits: Secondary | ICD-10-CM

## 2015-01-03 DIAGNOSIS — Z7901 Long term (current) use of anticoagulants: Secondary | ICD-10-CM

## 2015-01-05 ENCOUNTER — Encounter: Payer: Self-pay | Admitting: Family Medicine

## 2015-01-05 ENCOUNTER — Ambulatory Visit (INDEPENDENT_AMBULATORY_CARE_PROVIDER_SITE_OTHER): Payer: Medicare Other | Admitting: Family Medicine

## 2015-01-05 VITALS — BP 112/86 | HR 70 | Resp 12 | Wt 183.0 lb

## 2015-01-05 DIAGNOSIS — I482 Chronic atrial fibrillation, unspecified: Secondary | ICD-10-CM

## 2015-01-05 DIAGNOSIS — I10 Essential (primary) hypertension: Secondary | ICD-10-CM | POA: Diagnosis not present

## 2015-01-05 DIAGNOSIS — E039 Hypothyroidism, unspecified: Secondary | ICD-10-CM | POA: Diagnosis not present

## 2015-01-05 DIAGNOSIS — Z1159 Encounter for screening for other viral diseases: Secondary | ICD-10-CM | POA: Diagnosis not present

## 2015-01-05 DIAGNOSIS — I639 Cerebral infarction, unspecified: Secondary | ICD-10-CM | POA: Diagnosis not present

## 2015-01-05 DIAGNOSIS — Z7901 Long term (current) use of anticoagulants: Secondary | ICD-10-CM | POA: Diagnosis not present

## 2015-01-05 DIAGNOSIS — Z8673 Personal history of transient ischemic attack (TIA), and cerebral infarction without residual deficits: Secondary | ICD-10-CM

## 2015-01-05 LAB — TSH: TSH: 0.628 u[IU]/mL (ref 0.350–4.500)

## 2015-01-05 NOTE — Progress Notes (Signed)
   Subjective:    Patient ID: Eric Ross, male    DOB: March 17, 1955, 59 y.o.   MRN: [id number]  HPI He is here for an interval evaluation. He has a previous history of CVA due to true fibrillation. He currently is on Coumadin and is being followed by Dr. Gwenlyn Found for this. He also has a history of hypertension. He continues on Synthroid and is having no difficulty with this. He did bring a DMV form to be filled out for driving purposes. He has had no difficulty with this. He still does have some residual right-sided weakness.   Review of Systems     Objective:   Physical Exam Alert and in no distress. Cardiac exam does show an irregular rhythm. Lungs are clear to auscultation.       Assessment & Plan:  Hypothyroidism, unspecified hypothyroidism type - Plan: TSH  History of CVA (cerebrovascular accident)  Long term current use of anticoagulant therapy  Chronic atrial fibrillation ([place])  Essential hypertension  Need for hepatitis C screening test - Plan: Hepatitis C antibody  the DMV form was filled out. I really see no reason to continue to [place] form out as she him as no great driving risk.

## 2015-01-06 LAB — HEPATITIS C ANTIBODY: HCV Ab: NEGATIVE

## 2015-01-13 ENCOUNTER — Other Ambulatory Visit: Payer: Self-pay | Admitting: Cardiovascular Disease

## 2015-01-22 ENCOUNTER — Other Ambulatory Visit: Payer: Self-pay | Admitting: Cardiovascular Disease

## 2015-01-22 ENCOUNTER — Other Ambulatory Visit: Payer: Self-pay | Admitting: Family Medicine

## 2015-02-11 DIAGNOSIS — I4891 Unspecified atrial fibrillation: Secondary | ICD-10-CM | POA: Diagnosis not present

## 2015-02-11 DIAGNOSIS — I10 Essential (primary) hypertension: Secondary | ICD-10-CM | POA: Diagnosis not present

## 2015-02-11 DIAGNOSIS — I482 Chronic atrial fibrillation: Secondary | ICD-10-CM | POA: Diagnosis not present

## 2015-02-11 DIAGNOSIS — Z7901 Long term (current) use of anticoagulants: Secondary | ICD-10-CM | POA: Diagnosis not present

## 2015-02-12 LAB — HEPATIC FUNCTION PANEL
ALK PHOS: 42 U/L (ref 40–115)
ALT: 13 U/L (ref 9–46)
AST: 16 U/L (ref 10–35)
Albumin: 4.3 g/dL (ref 3.6–5.1)
BILIRUBIN TOTAL: 0.5 mg/dL (ref 0.2–1.2)
Bilirubin, Direct: 0.1 mg/dL (ref <=0.2)
Indirect Bilirubin: 0.4 mg/dL (ref 0.2–1.2)
Total Protein: 6.4 g/dL (ref 6.1–8.1)

## 2015-02-12 LAB — PROTIME-INR
INR: 2.29 — AB (ref <1.50)
PROTHROMBIN TIME: 25.5 s — AB (ref 11.6–15.2)

## 2015-02-12 LAB — LIPID PANEL
CHOL/HDL RATIO: 3.7 ratio (ref <=5.0)
CHOLESTEROL: 142 mg/dL (ref 125–200)
HDL: 38 mg/dL — ABNORMAL LOW (ref >=40)
LDL Cholesterol: 77 mg/dL (ref <130)
Triglycerides: 134 mg/dL (ref <150)
VLDL: 27 mg/dL (ref <30)

## 2015-02-14 ENCOUNTER — Ambulatory Visit (INDEPENDENT_AMBULATORY_CARE_PROVIDER_SITE_OTHER): Payer: Medicare Other | Admitting: Pharmacist Clinician (PhC)/ Clinical Pharmacy Specialist

## 2015-02-14 DIAGNOSIS — Z7901 Long term (current) use of anticoagulants: Secondary | ICD-10-CM

## 2015-02-14 DIAGNOSIS — I482 Chronic atrial fibrillation, unspecified: Secondary | ICD-10-CM

## 2015-02-14 DIAGNOSIS — Z8673 Personal history of transient ischemic attack (TIA), and cerebral infarction without residual deficits: Secondary | ICD-10-CM

## 2015-03-03 ENCOUNTER — Other Ambulatory Visit: Payer: Self-pay | Admitting: Cardiovascular Disease

## 2015-03-03 NOTE — Telephone Encounter (Signed)
Rx request sent to pharmacy.  

## 2015-03-24 ENCOUNTER — Other Ambulatory Visit: Payer: Self-pay | Admitting: Family Medicine

## 2015-04-12 ENCOUNTER — Ambulatory Visit (INDEPENDENT_AMBULATORY_CARE_PROVIDER_SITE_OTHER): Payer: Medicare Other | Admitting: Pharmacist Clinician (PhC)/ Clinical Pharmacy Specialist

## 2015-04-12 ENCOUNTER — Other Ambulatory Visit: Payer: Self-pay | Admitting: Cardiovascular Disease

## 2015-04-12 DIAGNOSIS — Z7901 Long term (current) use of anticoagulants: Secondary | ICD-10-CM | POA: Diagnosis not present

## 2015-04-12 DIAGNOSIS — I482 Chronic atrial fibrillation, unspecified: Secondary | ICD-10-CM

## 2015-04-12 DIAGNOSIS — Z8673 Personal history of transient ischemic attack (TIA), and cerebral infarction without residual deficits: Secondary | ICD-10-CM

## 2015-04-12 LAB — PROTIME-INR
INR: 1.99 — ABNORMAL HIGH
Prothrombin Time: 22.9 s — ABNORMAL HIGH (ref 11.6–15.2)

## 2015-04-26 ENCOUNTER — Other Ambulatory Visit: Payer: Self-pay | Admitting: Family Medicine

## 2015-04-29 ENCOUNTER — Other Ambulatory Visit: Payer: Self-pay | Admitting: Family Medicine

## 2015-05-09 ENCOUNTER — Other Ambulatory Visit: Payer: Self-pay | Admitting: Cardiovascular Disease

## 2015-05-09 DIAGNOSIS — Z7901 Long term (current) use of anticoagulants: Secondary | ICD-10-CM | POA: Diagnosis not present

## 2015-05-09 DIAGNOSIS — I482 Chronic atrial fibrillation: Secondary | ICD-10-CM | POA: Diagnosis not present

## 2015-05-09 LAB — PROTIME-INR
INR: 2.07 — AB (ref <1.50)
PROTHROMBIN TIME: 23.6 s — AB (ref 11.6–15.2)

## 2015-05-10 ENCOUNTER — Ambulatory Visit (INDEPENDENT_AMBULATORY_CARE_PROVIDER_SITE_OTHER): Payer: Medicare Other | Admitting: Pharmacist Clinician (PhC)/ Clinical Pharmacy Specialist

## 2015-05-10 DIAGNOSIS — I482 Chronic atrial fibrillation, unspecified: Secondary | ICD-10-CM

## 2015-05-10 DIAGNOSIS — Z8673 Personal history of transient ischemic attack (TIA), and cerebral infarction without residual deficits: Secondary | ICD-10-CM

## 2015-05-10 DIAGNOSIS — Z7901 Long term (current) use of anticoagulants: Secondary | ICD-10-CM

## 2015-06-29 ENCOUNTER — Other Ambulatory Visit: Payer: Self-pay | Admitting: Cardiovascular Disease

## 2015-06-29 DIAGNOSIS — Z7901 Long term (current) use of anticoagulants: Secondary | ICD-10-CM | POA: Diagnosis not present

## 2015-06-29 DIAGNOSIS — I482 Chronic atrial fibrillation: Secondary | ICD-10-CM | POA: Diagnosis not present

## 2015-06-30 ENCOUNTER — Ambulatory Visit (INDEPENDENT_AMBULATORY_CARE_PROVIDER_SITE_OTHER): Payer: Medicare Other | Admitting: Pharmacist

## 2015-06-30 DIAGNOSIS — I482 Chronic atrial fibrillation, unspecified: Secondary | ICD-10-CM

## 2015-06-30 DIAGNOSIS — Z8673 Personal history of transient ischemic attack (TIA), and cerebral infarction without residual deficits: Secondary | ICD-10-CM

## 2015-06-30 DIAGNOSIS — Z7901 Long term (current) use of anticoagulants: Secondary | ICD-10-CM

## 2015-06-30 LAB — PROTIME-INR
INR: 1.54 — AB (ref <1.50)
Prothrombin Time: 18.7 seconds — ABNORMAL HIGH (ref 11.6–15.2)

## 2015-07-13 ENCOUNTER — Telehealth: Payer: Self-pay | Admitting: Cardiovascular Disease

## 2015-07-13 NOTE — Telephone Encounter (Signed)
New Message  Pt wife requested to speak w/ rN about disability paperwork through [place]. Please call back and discuss.

## 2015-07-13 NOTE — Telephone Encounter (Signed)
Returned call to pt's wife, she is not on DPR, and advised her she would need to fill out paperwork the next visit to give permission to discuss the pt's records. She verbalized understanding and replied that the pt has had a severe stroke and does not talk well. She inquired on the process for disability paperwork. Advised her to come into office and ask to speak with medical records, who will go over the paperwork and start the process of filling out the paperwork. She verbalized understanding.

## 2015-07-19 ENCOUNTER — Telehealth: Payer: Self-pay | Admitting: Cardiovascular Disease

## 2015-07-19 NOTE — Telephone Encounter (Signed)
Mrs Guilmette brought [employer] Disability Forms/Signed AUTH/Check to process [employer] Disability Forms.  Forms/AUTH/Check sent to [facility] [address] to process.  Sent via [place] on 07/19/15. lp

## 2015-07-20 ENCOUNTER — Other Ambulatory Visit: Payer: Self-pay | Admitting: Cardiovascular Disease

## 2015-07-20 ENCOUNTER — Other Ambulatory Visit: Payer: Self-pay | Admitting: *Deleted

## 2015-07-22 ENCOUNTER — Other Ambulatory Visit: Payer: Self-pay | Admitting: Cardiovascular Disease

## 2015-07-22 DIAGNOSIS — Z7901 Long term (current) use of anticoagulants: Secondary | ICD-10-CM | POA: Diagnosis not present

## 2015-07-22 DIAGNOSIS — I482 Chronic atrial fibrillation: Secondary | ICD-10-CM | POA: Diagnosis not present

## 2015-07-23 ENCOUNTER — Other Ambulatory Visit: Payer: Self-pay | Admitting: Cardiovascular Disease

## 2015-07-23 LAB — PROTIME-INR
INR: 2.5 — ABNORMAL HIGH
Prothrombin Time: 25.7 s — ABNORMAL HIGH (ref 9.0–11.5)

## 2015-07-25 ENCOUNTER — Ambulatory Visit (INDEPENDENT_AMBULATORY_CARE_PROVIDER_SITE_OTHER): Payer: Medicare Other | Admitting: Pharmacist Clinician (PhC)/ Clinical Pharmacy Specialist

## 2015-07-25 DIAGNOSIS — I482 Chronic atrial fibrillation, unspecified: Secondary | ICD-10-CM

## 2015-07-25 DIAGNOSIS — Z7901 Long term (current) use of anticoagulants: Secondary | ICD-10-CM

## 2015-07-25 DIAGNOSIS — Z8673 Personal history of transient ischemic attack (TIA), and cerebral infarction without residual deficits: Secondary | ICD-10-CM

## 2015-07-27 ENCOUNTER — Telehealth: Payer: Self-pay | Admitting: Cardiovascular Disease

## 2015-07-27 NOTE — Telephone Encounter (Signed)
Received [employer] Disability forms back from [place].  Interoffice forwarded to Dr Gwenlyn Found for his completion and sign. lp

## 2015-08-01 ENCOUNTER — Telehealth: Payer: Self-pay | Admitting: Cardiovascular Disease

## 2015-08-01 NOTE — Telephone Encounter (Signed)
Received signed [employer] Disability Forms back from Dr Gwenlyn Found.  Notified patient and faxed to [place] on 08/01/15. lp

## 2015-08-29 ENCOUNTER — Other Ambulatory Visit: Payer: Self-pay | Admitting: Cardiovascular Disease

## 2015-08-29 DIAGNOSIS — I482 Chronic atrial fibrillation: Secondary | ICD-10-CM | POA: Diagnosis not present

## 2015-08-29 DIAGNOSIS — Z7901 Long term (current) use of anticoagulants: Secondary | ICD-10-CM | POA: Diagnosis not present

## 2015-08-29 LAB — PROTIME-INR
INR: 2.9 — ABNORMAL HIGH
PROTHROMBIN TIME: 29.5 s — AB (ref 9.0–11.5)

## 2015-08-30 ENCOUNTER — Ambulatory Visit (INDEPENDENT_AMBULATORY_CARE_PROVIDER_SITE_OTHER): Payer: Medicare Other | Admitting: Pharmacist Clinician (PhC)/ Clinical Pharmacy Specialist

## 2015-08-30 DIAGNOSIS — I482 Chronic atrial fibrillation, unspecified: Secondary | ICD-10-CM

## 2015-08-30 DIAGNOSIS — Z7901 Long term (current) use of anticoagulants: Secondary | ICD-10-CM

## 2015-08-30 DIAGNOSIS — Z8673 Personal history of transient ischemic attack (TIA), and cerebral infarction without residual deficits: Secondary | ICD-10-CM

## 2015-10-19 ENCOUNTER — Other Ambulatory Visit: Payer: Self-pay | Admitting: Family Medicine

## 2015-10-22 ENCOUNTER — Other Ambulatory Visit: Payer: Self-pay | Admitting: Cardiovascular Disease

## 2015-10-22 ENCOUNTER — Other Ambulatory Visit: Payer: Self-pay | Admitting: Family Medicine

## 2015-10-22 DIAGNOSIS — Z23 Encounter for immunization: Secondary | ICD-10-CM | POA: Diagnosis not present

## 2015-10-24 NOTE — Telephone Encounter (Signed)
Rx request sent to pharmacy.  

## 2015-10-26 ENCOUNTER — Other Ambulatory Visit: Payer: Self-pay | Admitting: Cardiovascular Disease

## 2015-10-26 DIAGNOSIS — I482 Chronic atrial fibrillation: Secondary | ICD-10-CM | POA: Diagnosis not present

## 2015-10-26 DIAGNOSIS — Z7901 Long term (current) use of anticoagulants: Secondary | ICD-10-CM | POA: Diagnosis not present

## 2015-10-26 LAB — PROTIME-INR
INR: 1.8 — AB
Prothrombin Time: 19 s — ABNORMAL HIGH (ref 9.0–11.5)

## 2015-10-27 ENCOUNTER — Ambulatory Visit (INDEPENDENT_AMBULATORY_CARE_PROVIDER_SITE_OTHER): Payer: Medicare Other | Admitting: Pharmacist Clinician (PhC)/ Clinical Pharmacy Specialist

## 2015-10-27 DIAGNOSIS — I482 Chronic atrial fibrillation, unspecified: Secondary | ICD-10-CM

## 2015-10-27 DIAGNOSIS — Z7901 Long term (current) use of anticoagulants: Secondary | ICD-10-CM

## 2015-10-27 DIAGNOSIS — Z8673 Personal history of transient ischemic attack (TIA), and cerebral infarction without residual deficits: Secondary | ICD-10-CM

## 2015-11-16 ENCOUNTER — Ambulatory Visit (INDEPENDENT_AMBULATORY_CARE_PROVIDER_SITE_OTHER): Payer: Medicare Other | Admitting: Cardiovascular Disease

## 2015-11-16 ENCOUNTER — Encounter: Payer: Self-pay | Admitting: Cardiovascular Disease

## 2015-11-16 VITALS — BP 100/60 | HR 76 | Ht 73.0 in | Wt 176.8 lb

## 2015-11-16 DIAGNOSIS — E7800 Pure hypercholesterolemia, unspecified: Secondary | ICD-10-CM

## 2015-11-16 DIAGNOSIS — Z8673 Personal history of transient ischemic attack (TIA), and cerebral infarction without residual deficits: Secondary | ICD-10-CM | POA: Diagnosis not present

## 2015-11-16 DIAGNOSIS — I482 Chronic atrial fibrillation, unspecified: Secondary | ICD-10-CM

## 2015-11-16 DIAGNOSIS — I10 Essential (primary) hypertension: Secondary | ICD-10-CM | POA: Diagnosis not present

## 2015-11-16 NOTE — Progress Notes (Signed)
11/16/2015 Eric Ross   1956/01/04  [id number]  Primary Physician Wyatt Haste, MD Primary Cardiologist: Lorretta Harp MD [name], [place]  HPI:  Eric Ross is a 60 year old thin appearing married Caucasian male father of 36, grandfather and 3 grandchildren who is accompanied by his wife Eric Ross today. I last saw him 11/03/14. He has a history of nonischemic cardiomyopathy cath performed in 2003 that showed normal coronary arteries. He has an ejection fraction in the 45-50% range. He does have chronic A. Fib on Coumadin anticoagulation rate controlled. His other problems include hyperlipidemia on statin therapy and treated hypertension. He does get occasional chest burning and a Myoview stress test performed 05/30/11 which was normal. Since I saw him a year ago he complains of occasional atypical chest pain and some tachycardia palpitations. He also complains of dizziness.   Current Outpatient Prescriptions  Medication Sig Dispense Refill  . carvedilol (COREG) 12.5 MG tablet TAKE 1 TABLET BY MOUTH TWICE A DAY 180 tablet 0  . carvedilol (COREG) 12.5 MG tablet TAKE 1 TABLET BY MOUTH TWICE A DAY 180 tablet 0  . CIALIS 20 MG tablet TAKE 1 TABLET (20 MG TOTAL) BY MOUTH DAILY AS NEEDED FOR ERECTILE DYSFUNCTION. 5 tablet 5  . levothyroxine (SYNTHROID, LEVOTHROID) 112 MCG tablet TAKE 1 TABLET (112 MCG TOTAL) BY MOUTH DAILY. 90 tablet 2  . pantoprazole (PROTONIX) 40 MG tablet TAKE 1 TABLET (40 MG TOTAL) BY MOUTH DAILY. 90 tablet 2  . ramipril (ALTACE) 2.5 MG capsule TAKE ONE CAPSULE BY MOUTH EVERY DAY 90 capsule 0  . simvastatin (ZOCOR) 20 MG tablet TAKE 1 TABLET (20 MG TOTAL) BY MOUTH DAILY AT 6 PM. 90 tablet 2  . warfarin (COUMADIN) 5 MG tablet TAKE 1 TABLET BY MOUTH DAILY OR AS DIRECTED 90 tablet 1   No current facility-administered medications for this visit.     Allergies  Allergen Reactions  . Penicillins     REACTION: Paralyzed him @ 59 yrs old    Social History    Social History  . Marital status: Married    Spouse name: N/A  . Number of children: N/A  . Years of education: N/A   Occupational History  . Not on file.   Social History Main Topics  . Smoking status: Never Smoker  . Smokeless tobacco: Current User    Types: Chew  . Alcohol use No  . Drug use: No  . Sexual activity: Not on file   Other Topics Concern  . Not on file   Social History Narrative  . No narrative on file     Review of Systems: General: negative for chills, fever, night sweats or weight changes.  Cardiovascular: negative for chest pain, dyspnea on exertion, edema, orthopnea, palpitations, paroxysmal nocturnal dyspnea or shortness of breath Dermatological: negative for rash Respiratory: negative for cough or wheezing Urologic: negative for hematuria Abdominal: negative for nausea, vomiting, diarrhea, bright red blood per rectum, melena, or hematemesis Neurologic: negative for visual changes, syncope, or dizziness All other systems reviewed and are otherwise negative except as noted above.    Blood pressure 100/60, pulse 76, height 6\' 1"  (1.854 m), weight 176 lb 12.8 oz (80.2 kg).  General appearance: alert and no distress Neck: no adenopathy, no carotid bruit, no JVD, supple, symmetrical, trachea midline and thyroid not enlarged, symmetric, no tenderness/mass/nodules Lungs: clear to auscultation bilaterally Heart: irregularly irregular rhythm Extremities: extremities normal, atraumatic, no cyanosis or edema  EKG atrial fibrillation with a ventricular  response of 76. I personally reviewed his EKG  ASSESSMENT AND PLAN:   Essential hypertension History of hypertension with blood pressure measured at 100/60. He is on carvedilol and ramipril. Continued current meds at current dosing  Atrial fibrillation History of chronic A. fib rate controlled on Coumadin anticoagulation.  History of CVA (cerebrovascular accident) History of remote CVA leaving him  dysarthric probably embolic related to A. fib currently on Coumadin anticoagulation.  Hyperlipidemia History of hyperlipidemia on simvastatin with recent lipid profile performed 02/11/15 revealed total cholesterol of 42, LDL 77 HDL of 38.      Lorretta Harp MD FACP,FACC,FAHA, [facility] 11/16/2015 11:25 AM

## 2015-11-16 NOTE — Assessment & Plan Note (Signed)
History of chronic A. fib rate controlled on Coumadin anticoagulation. 

## 2015-11-16 NOTE — Assessment & Plan Note (Signed)
History of hyperlipidemia on simvastatin with recent lipid profile performed 02/11/15 revealed total cholesterol of 42, LDL 77 HDL of 38.

## 2015-11-16 NOTE — Assessment & Plan Note (Signed)
History of remote CVA leaving him dysarthric probably embolic related to A. fib currently on Coumadin anticoagulation.

## 2015-11-16 NOTE — Patient Instructions (Signed)
Medication Instructions:  NO CHANGES.   Follow-Up: Your physician wants you to follow-up in: 12 MONTHS WITH DR BERRY.  You will receive a reminder letter in the mail two months in advance. If you don't receive a letter, please call our office to schedule the follow-up appointment.   If you need a refill on your cardiac medications before your next appointment, please call your pharmacy.   

## 2015-11-16 NOTE — Assessment & Plan Note (Signed)
History of hypertension with blood pressure measured at 100/60. He is on carvedilol and ramipril. Continued current meds at current dosing

## 2015-12-04 ENCOUNTER — Other Ambulatory Visit: Payer: Self-pay | Admitting: Cardiovascular Disease

## 2015-12-14 ENCOUNTER — Other Ambulatory Visit: Payer: Self-pay | Admitting: Cardiovascular Disease

## 2015-12-14 DIAGNOSIS — Z7901 Long term (current) use of anticoagulants: Secondary | ICD-10-CM | POA: Diagnosis not present

## 2015-12-14 DIAGNOSIS — I482 Chronic atrial fibrillation: Secondary | ICD-10-CM | POA: Diagnosis not present

## 2015-12-14 LAB — PROTIME-INR
INR: 2.3 — AB (ref <=1.1)
INR: 2.3 — ABNORMAL HIGH
PROTHROMBIN TIME: 23.5 s — AB (ref 9.0–11.5)

## 2015-12-15 ENCOUNTER — Ambulatory Visit (INDEPENDENT_AMBULATORY_CARE_PROVIDER_SITE_OTHER): Payer: Medicare Other | Admitting: Pharmacist

## 2015-12-15 DIAGNOSIS — Z8673 Personal history of transient ischemic attack (TIA), and cerebral infarction without residual deficits: Secondary | ICD-10-CM

## 2015-12-15 DIAGNOSIS — I482 Chronic atrial fibrillation, unspecified: Secondary | ICD-10-CM

## 2015-12-15 DIAGNOSIS — Z7901 Long term (current) use of anticoagulants: Secondary | ICD-10-CM

## 2016-01-16 ENCOUNTER — Other Ambulatory Visit: Payer: Self-pay | Admitting: Family Medicine

## 2016-01-18 ENCOUNTER — Other Ambulatory Visit: Payer: Self-pay | Admitting: Family Medicine

## 2016-01-26 ENCOUNTER — Other Ambulatory Visit: Payer: Self-pay | Admitting: Cardiovascular Disease

## 2016-01-26 DIAGNOSIS — I482 Chronic atrial fibrillation: Secondary | ICD-10-CM | POA: Diagnosis not present

## 2016-01-26 DIAGNOSIS — Z7901 Long term (current) use of anticoagulants: Secondary | ICD-10-CM | POA: Diagnosis not present

## 2016-01-26 LAB — PROTIME-INR: INR: 2.5 — AB (ref <=1.1)

## 2016-01-27 ENCOUNTER — Ambulatory Visit (INDEPENDENT_AMBULATORY_CARE_PROVIDER_SITE_OTHER): Payer: Medicare Other | Admitting: Pharmacist

## 2016-01-27 DIAGNOSIS — I482 Chronic atrial fibrillation, unspecified: Secondary | ICD-10-CM

## 2016-01-27 DIAGNOSIS — Z8673 Personal history of transient ischemic attack (TIA), and cerebral infarction without residual deficits: Secondary | ICD-10-CM

## 2016-01-27 DIAGNOSIS — Z7901 Long term (current) use of anticoagulants: Secondary | ICD-10-CM

## 2016-01-27 LAB — PROTIME-INR
INR: 2.5 — ABNORMAL HIGH
Prothrombin Time: 25.8 s — ABNORMAL HIGH (ref 9.0–11.5)

## 2016-03-06 ENCOUNTER — Other Ambulatory Visit: Payer: Self-pay | Admitting: Cardiovascular Disease

## 2016-03-06 DIAGNOSIS — I482 Chronic atrial fibrillation: Secondary | ICD-10-CM | POA: Diagnosis not present

## 2016-03-06 DIAGNOSIS — Z7901 Long term (current) use of anticoagulants: Secondary | ICD-10-CM | POA: Diagnosis not present

## 2016-03-06 LAB — PROTIME-INR
INR: 2.4 — ABNORMAL HIGH
PROTHROMBIN TIME: 24.4 s — AB (ref 9.0–11.5)

## 2016-03-08 ENCOUNTER — Ambulatory Visit (INDEPENDENT_AMBULATORY_CARE_PROVIDER_SITE_OTHER): Payer: Medicare Other | Admitting: Pharmacist

## 2016-03-08 DIAGNOSIS — Z8673 Personal history of transient ischemic attack (TIA), and cerebral infarction without residual deficits: Secondary | ICD-10-CM

## 2016-03-08 DIAGNOSIS — I482 Chronic atrial fibrillation, unspecified: Secondary | ICD-10-CM

## 2016-03-08 DIAGNOSIS — Z7901 Long term (current) use of anticoagulants: Secondary | ICD-10-CM

## 2016-04-08 ENCOUNTER — Other Ambulatory Visit: Payer: Self-pay | Admitting: Family Medicine

## 2016-04-16 ENCOUNTER — Telehealth: Payer: Self-pay | Admitting: Family Medicine

## 2016-04-16 MED ORDER — RAMIPRIL 2.5 MG PO CAPS: 2.5000 mg | ORAL_CAPSULE | Freq: Every day | ORAL | 3 refills | Status: DC

## 2016-04-16 NOTE — Telephone Encounter (Signed)
Recv'd fax from [facility] stating insurance requires 90 day supply instead of 30 days for Ramipril, is this ok?

## 2016-04-19 ENCOUNTER — Other Ambulatory Visit: Payer: Self-pay | Admitting: Cardiovascular Disease

## 2016-04-19 DIAGNOSIS — I482 Chronic atrial fibrillation: Secondary | ICD-10-CM | POA: Diagnosis not present

## 2016-04-19 DIAGNOSIS — Z7901 Long term (current) use of anticoagulants: Secondary | ICD-10-CM | POA: Diagnosis not present

## 2016-04-19 LAB — PROTIME-INR
INR: 2.3 — AB
Prothrombin Time: 24 s — ABNORMAL HIGH (ref 9.0–11.5)

## 2016-04-20 ENCOUNTER — Ambulatory Visit (INDEPENDENT_AMBULATORY_CARE_PROVIDER_SITE_OTHER): Payer: Medicare Other | Admitting: Pharmacist Clinician (PhC)/ Clinical Pharmacy Specialist

## 2016-04-20 DIAGNOSIS — Z8673 Personal history of transient ischemic attack (TIA), and cerebral infarction without residual deficits: Secondary | ICD-10-CM

## 2016-04-20 DIAGNOSIS — Z7901 Long term (current) use of anticoagulants: Secondary | ICD-10-CM

## 2016-04-20 DIAGNOSIS — I482 Chronic atrial fibrillation, unspecified: Secondary | ICD-10-CM

## 2016-04-25 ENCOUNTER — Encounter: Payer: Self-pay | Admitting: Family Medicine

## 2016-04-25 ENCOUNTER — Ambulatory Visit (INDEPENDENT_AMBULATORY_CARE_PROVIDER_SITE_OTHER): Payer: Medicare Other | Admitting: Family Medicine

## 2016-04-25 VITALS — BP 114/70 | HR 88 | Ht 73.0 in | Wt 180.0 lb

## 2016-04-25 DIAGNOSIS — Z8673 Personal history of transient ischemic attack (TIA), and cerebral infarction without residual deficits: Secondary | ICD-10-CM | POA: Diagnosis not present

## 2016-04-25 DIAGNOSIS — E7800 Pure hypercholesterolemia, unspecified: Secondary | ICD-10-CM | POA: Diagnosis not present

## 2016-04-25 DIAGNOSIS — J309 Allergic rhinitis, unspecified: Secondary | ICD-10-CM | POA: Diagnosis not present

## 2016-04-25 DIAGNOSIS — I10 Essential (primary) hypertension: Secondary | ICD-10-CM

## 2016-04-25 DIAGNOSIS — Z7901 Long term (current) use of anticoagulants: Secondary | ICD-10-CM | POA: Diagnosis not present

## 2016-04-25 DIAGNOSIS — K219 Gastro-esophageal reflux disease without esophagitis: Secondary | ICD-10-CM | POA: Insufficient documentation

## 2016-04-25 DIAGNOSIS — N529 Male erectile dysfunction, unspecified: Secondary | ICD-10-CM | POA: Insufficient documentation

## 2016-04-25 DIAGNOSIS — I482 Chronic atrial fibrillation, unspecified: Secondary | ICD-10-CM

## 2016-04-25 DIAGNOSIS — E039 Hypothyroidism, unspecified: Secondary | ICD-10-CM | POA: Diagnosis not present

## 2016-04-25 DIAGNOSIS — M1812 Unilateral primary osteoarthritis of first carpometacarpal joint, left hand: Secondary | ICD-10-CM

## 2016-04-25 LAB — COMPREHENSIVE METABOLIC PANEL
ALBUMIN: 4.5 g/dL (ref 3.6–5.1)
ALK PHOS: 42 U/L (ref 40–115)
ALT: 17 U/L (ref 9–46)
AST: 21 U/L (ref 10–35)
BILIRUBIN TOTAL: 0.5 mg/dL (ref 0.2–1.2)
BUN: 18 mg/dL (ref 7–25)
CALCIUM: 9.8 mg/dL (ref 8.6–10.3)
CO2: 24 mmol/L (ref 20–31)
CREATININE: 1.06 mg/dL (ref 0.70–1.25)
Chloride: 106 mmol/L (ref 98–110)
Glucose, Bld: 80 mg/dL (ref 65–99)
Potassium: 4.4 mmol/L (ref 3.5–5.3)
SODIUM: 139 mmol/L (ref 135–146)
TOTAL PROTEIN: 7.5 g/dL (ref 6.1–8.1)

## 2016-04-25 LAB — CBC WITH DIFFERENTIAL/PLATELET
BASOS PCT: 1 %
Basophils Absolute: 79 cells/uL (ref 0–200)
Eosinophils Absolute: 237 cells/uL (ref 15–500)
Eosinophils Relative: 3 %
HEMATOCRIT: 43.4 % (ref 38.5–50.0)
HEMOGLOBIN: 14.7 g/dL (ref 13.2–17.1)
LYMPHS PCT: 29 %
Lymphs Abs: 2291 cells/uL (ref 850–3900)
MCH: 30.3 pg (ref 27.0–33.0)
MCHC: 33.9 g/dL (ref 32.0–36.0)
MCV: 89.5 fL (ref 80.0–100.0)
MONO ABS: 474 {cells}/uL (ref 200–950)
MPV: 10.3 fL (ref 7.5–12.5)
Monocytes Relative: 6 %
NEUTROS ABS: 4819 {cells}/uL (ref 1500–7800)
Neutrophils Relative %: 61 %
Platelets: 165 10*3/uL (ref 140–400)
RBC: 4.85 MIL/uL (ref 4.20–5.80)
RDW: 13.2 % (ref 11.0–15.0)
WBC: 7.9 10*3/uL (ref 4.0–10.5)

## 2016-04-25 LAB — LIPID PANEL
CHOL/HDL RATIO: 3.6 ratio (ref <5.0)
CHOLESTEROL: 149 mg/dL (ref <200)
HDL: 41 mg/dL (ref >40)
LDL Cholesterol: 78 mg/dL (ref <100)
Triglycerides: 149 mg/dL (ref <150)
VLDL: 30 mg/dL (ref <30)

## 2016-04-25 MED ORDER — SIMVASTATIN 20 MG PO TABS: ORAL_TABLET | ORAL | 3 refills | Status: DC

## 2016-04-25 MED ORDER — LEVOTHYROXINE SODIUM 112 MCG PO TABS: ORAL_TABLET | ORAL | 3 refills | Status: DC

## 2016-04-25 MED ORDER — PANTOPRAZOLE SODIUM 40 MG PO TBEC: DELAYED_RELEASE_TABLET | ORAL | 3 refills | Status: DC

## 2016-04-25 NOTE — Progress Notes (Signed)
   Subjective:    Patient ID: Eric Ross, male    DOB: 22-Mar-1955, 61 y.o.   MRN: [id number]  HPI He is here for an interval evaluation. He continues to be followed by cardiology for his underlying atrial fib and subsequent CVA. He is taking Coumadin staying under adequate control with past. He continues on Coreg, Altace for his blood pressure. Continues on Synthroid and having no difficulty with that medication. He does use Protonix on an as-needed basis for his reflux. He does use Cialis for ED but has not had any in quite some time due to cost. He takes simvastatin and is having no muscle aches or pains. Does have underlying allergies but no problems at the present time. He mainly complains of left thumb pain has a base. It interferes with his ability to use his hand.   Review of Systems     Objective:   Physical Exam Alert and in no distress. Tympanic membranes and canals are normal. Pharyngeal area is normal. Neck is supple without adenopathy or thyromegaly. Cardiac exam shows a regular sinus rhythm without murmurs or gallops. Lungs are clear to auscultation. Tender to palpation over the carpal metacarpal joint of the thumb.       Assessment & Plan:  Essential hypertension  Chronic atrial fibrillation (HCC)  Allergic rhinitis, unspecified chronicity, unspecified seasonality, unspecified trigger  Long term current use of anticoagulant therapy  Pure hypercholesterolemia  History of CVA (cerebrovascular accident)  Arthritis of carpometacarpal (CMC) joint of left thumb  Erectile dysfunction, unspecified erectile dysfunction type He is stable on his meds for hypertension, atrial fib, hyperlipidemia. His allergies are not giving him any trouble. Discusses treatment for the thumb and decided to have an injection. 20 mg of Kenalog and 1 mL of Xylocaine was injected into the carpal metacarpal joint without difficulty. He obtained almost immediate relief of his pain. Also  discussed getting his Cialis from a [facility] as it would be much cheaper. They will keep me informed concerning this. The end of the encounter he mentioned having difficulty with back pain. Recommended he follow-up with his orthopedic surgeon.

## 2016-04-26 ENCOUNTER — Encounter: Payer: Self-pay | Admitting: Gastroenterology

## 2016-04-26 LAB — TSH: TSH: 0.46 m[IU]/L (ref 0.40–4.50)

## 2016-05-08 ENCOUNTER — Other Ambulatory Visit: Payer: Self-pay | Admitting: Family Medicine

## 2016-05-16 ENCOUNTER — Other Ambulatory Visit: Payer: Self-pay | Admitting: Cardiovascular Disease

## 2016-05-16 DIAGNOSIS — I482 Chronic atrial fibrillation: Secondary | ICD-10-CM | POA: Diagnosis not present

## 2016-05-16 DIAGNOSIS — Z7901 Long term (current) use of anticoagulants: Secondary | ICD-10-CM | POA: Diagnosis not present

## 2016-05-16 DIAGNOSIS — Z8673 Personal history of transient ischemic attack (TIA), and cerebral infarction without residual deficits: Secondary | ICD-10-CM | POA: Diagnosis not present

## 2016-05-16 LAB — PROTIME-INR
INR: 2.7 — ABNORMAL HIGH
Prothrombin Time: 27.2 s — ABNORMAL HIGH (ref 9.0–11.5)

## 2016-05-17 ENCOUNTER — Ambulatory Visit (INDEPENDENT_AMBULATORY_CARE_PROVIDER_SITE_OTHER): Payer: Medicare Other | Admitting: Pharmacist Clinician (PhC)/ Clinical Pharmacy Specialist

## 2016-05-17 DIAGNOSIS — Z7901 Long term (current) use of anticoagulants: Secondary | ICD-10-CM

## 2016-05-17 DIAGNOSIS — Z8673 Personal history of transient ischemic attack (TIA), and cerebral infarction without residual deficits: Secondary | ICD-10-CM

## 2016-05-17 DIAGNOSIS — I482 Chronic atrial fibrillation, unspecified: Secondary | ICD-10-CM

## 2016-06-14 ENCOUNTER — Other Ambulatory Visit: Payer: Self-pay | Admitting: Cardiovascular Disease

## 2016-06-14 DIAGNOSIS — I482 Chronic atrial fibrillation: Secondary | ICD-10-CM | POA: Diagnosis not present

## 2016-06-14 DIAGNOSIS — Z7901 Long term (current) use of anticoagulants: Secondary | ICD-10-CM | POA: Diagnosis not present

## 2016-06-15 ENCOUNTER — Ambulatory Visit (INDEPENDENT_AMBULATORY_CARE_PROVIDER_SITE_OTHER): Payer: Medicare Other | Admitting: Pharmacist Clinician (PhC)/ Clinical Pharmacy Specialist

## 2016-06-15 DIAGNOSIS — Z7901 Long term (current) use of anticoagulants: Secondary | ICD-10-CM

## 2016-06-15 DIAGNOSIS — I482 Chronic atrial fibrillation, unspecified: Secondary | ICD-10-CM

## 2016-06-15 DIAGNOSIS — Z8673 Personal history of transient ischemic attack (TIA), and cerebral infarction without residual deficits: Secondary | ICD-10-CM

## 2016-06-15 LAB — PROTIME-INR
INR: 2.2 — AB
Prothrombin Time: 23.1 s — ABNORMAL HIGH (ref 9.0–11.5)

## 2016-07-11 ENCOUNTER — Other Ambulatory Visit: Payer: Self-pay | Admitting: Family Medicine

## 2016-07-25 ENCOUNTER — Other Ambulatory Visit: Payer: Self-pay | Admitting: Cardiovascular Disease

## 2016-07-25 DIAGNOSIS — Z7901 Long term (current) use of anticoagulants: Secondary | ICD-10-CM | POA: Diagnosis not present

## 2016-07-25 DIAGNOSIS — I482 Chronic atrial fibrillation: Secondary | ICD-10-CM | POA: Diagnosis not present

## 2016-07-25 LAB — PROTIME-INR
INR: 2.7 — ABNORMAL HIGH
Prothrombin Time: 27.1 s — ABNORMAL HIGH (ref 9.0–11.5)

## 2016-07-26 ENCOUNTER — Ambulatory Visit (INDEPENDENT_AMBULATORY_CARE_PROVIDER_SITE_OTHER): Payer: Medicare Other | Admitting: Pharmacist

## 2016-07-26 DIAGNOSIS — I482 Chronic atrial fibrillation, unspecified: Secondary | ICD-10-CM

## 2016-07-26 DIAGNOSIS — Z8673 Personal history of transient ischemic attack (TIA), and cerebral infarction without residual deficits: Secondary | ICD-10-CM

## 2016-07-26 DIAGNOSIS — Z7901 Long term (current) use of anticoagulants: Secondary | ICD-10-CM

## 2016-08-15 ENCOUNTER — Other Ambulatory Visit: Payer: Self-pay | Admitting: Cardiovascular Disease

## 2016-09-20 ENCOUNTER — Ambulatory Visit (INDEPENDENT_AMBULATORY_CARE_PROVIDER_SITE_OTHER): Payer: Medicare Other | Admitting: Pharmacist

## 2016-09-20 ENCOUNTER — Other Ambulatory Visit: Payer: Self-pay | Admitting: Cardiovascular Disease

## 2016-09-20 DIAGNOSIS — I482 Chronic atrial fibrillation, unspecified: Secondary | ICD-10-CM

## 2016-09-20 DIAGNOSIS — Z7901 Long term (current) use of anticoagulants: Secondary | ICD-10-CM | POA: Diagnosis not present

## 2016-09-20 DIAGNOSIS — Z8673 Personal history of transient ischemic attack (TIA), and cerebral infarction without residual deficits: Secondary | ICD-10-CM

## 2016-09-20 LAB — PROTIME-INR
INR: 4.3 — AB
INR: 4.3 — AB (ref <=1.1)
PROTHROMBIN TIME: 44.9 s — AB (ref 9.0–11.5)

## 2016-09-27 ENCOUNTER — Telehealth: Payer: Self-pay | Admitting: Pharmacist Clinician (PhC)/ Clinical Pharmacy Specialist

## 2016-09-27 NOTE — Telephone Encounter (Signed)
Coumadin letter 

## 2016-10-09 ENCOUNTER — Other Ambulatory Visit: Payer: Self-pay | Admitting: Family Medicine

## 2016-10-19 DIAGNOSIS — Z23 Encounter for immunization: Secondary | ICD-10-CM | POA: Diagnosis not present

## 2016-10-26 ENCOUNTER — Other Ambulatory Visit: Payer: Self-pay | Admitting: Cardiovascular Disease

## 2016-10-26 DIAGNOSIS — I482 Chronic atrial fibrillation: Secondary | ICD-10-CM | POA: Diagnosis not present

## 2016-10-26 DIAGNOSIS — Z7901 Long term (current) use of anticoagulants: Secondary | ICD-10-CM | POA: Diagnosis not present

## 2016-10-31 ENCOUNTER — Ambulatory Visit (INDEPENDENT_AMBULATORY_CARE_PROVIDER_SITE_OTHER): Payer: Medicare Other | Admitting: Pharmacist

## 2016-10-31 DIAGNOSIS — Z7901 Long term (current) use of anticoagulants: Secondary | ICD-10-CM

## 2016-10-31 DIAGNOSIS — I482 Chronic atrial fibrillation, unspecified: Secondary | ICD-10-CM

## 2016-10-31 DIAGNOSIS — Z8673 Personal history of transient ischemic attack (TIA), and cerebral infarction without residual deficits: Secondary | ICD-10-CM

## 2016-10-31 LAB — PROTIME-INR
INR: 4.3 — AB
Prothrombin Time: 44.7 s — ABNORMAL HIGH (ref 9.0–11.5)

## 2016-11-13 ENCOUNTER — Other Ambulatory Visit: Payer: Self-pay | Admitting: Family Medicine

## 2016-11-16 ENCOUNTER — Other Ambulatory Visit: Payer: Self-pay | Admitting: Family Medicine

## 2016-11-20 ENCOUNTER — Other Ambulatory Visit: Payer: Self-pay | Admitting: Family Medicine

## 2016-11-20 NOTE — Telephone Encounter (Signed)
Called pt his voicemail has not been set up

## 2016-11-24 ENCOUNTER — Other Ambulatory Visit: Payer: Self-pay | Admitting: Family Medicine

## 2016-11-28 ENCOUNTER — Other Ambulatory Visit: Payer: Self-pay | Admitting: Cardiovascular Disease

## 2016-11-28 DIAGNOSIS — Z7901 Long term (current) use of anticoagulants: Secondary | ICD-10-CM | POA: Diagnosis not present

## 2016-11-28 DIAGNOSIS — I482 Chronic atrial fibrillation: Secondary | ICD-10-CM | POA: Diagnosis not present

## 2016-11-28 LAB — PROTIME-INR

## 2016-11-29 ENCOUNTER — Ambulatory Visit (INDEPENDENT_AMBULATORY_CARE_PROVIDER_SITE_OTHER): Payer: Medicare Other | Admitting: Pharmacist Clinician (PhC)/ Clinical Pharmacy Specialist

## 2016-11-29 DIAGNOSIS — I482 Chronic atrial fibrillation, unspecified: Secondary | ICD-10-CM

## 2016-11-29 DIAGNOSIS — Z7901 Long term (current) use of anticoagulants: Secondary | ICD-10-CM

## 2016-11-29 DIAGNOSIS — Z8673 Personal history of transient ischemic attack (TIA), and cerebral infarction without residual deficits: Secondary | ICD-10-CM

## 2016-11-29 LAB — PROTIME-INR
INR: 4.2 — ABNORMAL HIGH
Prothrombin Time: 43.7 s — ABNORMAL HIGH (ref 9.0–11.5)

## 2016-12-12 ENCOUNTER — Ambulatory Visit: Payer: Medicare Other | Admitting: Cardiovascular Disease

## 2016-12-19 ENCOUNTER — Encounter: Payer: Self-pay | Admitting: Cardiovascular Disease

## 2016-12-19 ENCOUNTER — Ambulatory Visit (INDEPENDENT_AMBULATORY_CARE_PROVIDER_SITE_OTHER): Payer: Medicare Other | Admitting: Cardiovascular Disease

## 2016-12-19 ENCOUNTER — Encounter (INDEPENDENT_AMBULATORY_CARE_PROVIDER_SITE_OTHER): Payer: Self-pay

## 2016-12-19 VITALS — BP 114/74 | HR 82 | Ht 74.0 in | Wt 183.0 lb

## 2016-12-19 DIAGNOSIS — I10 Essential (primary) hypertension: Secondary | ICD-10-CM | POA: Diagnosis not present

## 2016-12-19 DIAGNOSIS — E7800 Pure hypercholesterolemia, unspecified: Secondary | ICD-10-CM | POA: Diagnosis not present

## 2016-12-19 DIAGNOSIS — I481 Persistent atrial fibrillation: Secondary | ICD-10-CM

## 2016-12-19 DIAGNOSIS — I4819 Other persistent atrial fibrillation: Secondary | ICD-10-CM
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
## 2016-12-19 NOTE — Assessment & Plan Note (Signed)
History of essential hypertension blood pressure measures 114/74. He is on carvedilol and ramipril. Continue current meds at current dosing

## 2016-12-19 NOTE — Assessment & Plan Note (Signed)
History of hyperlipidemia on statin therapy with recent lipid profile performed 04/17/16 revealing a total cholesterol 149, LDL 78 and HDL of 41.

## 2016-12-19 NOTE — Assessment & Plan Note (Signed)
History of chronic atrial fibrillation rate controlled on Coumadin and a coagulation.

## 2016-12-19 NOTE — Patient Instructions (Signed)
Your physician wants you to follow-up in: ONE YEAR WITH DR BERRY You will receive a reminder letter in the mail two months in advance. If you don't receive a letter, please call our office to schedule the follow-up appointment.   If you need a refill on your cardiac medications before your next appointment, please call your pharmacy.  

## 2017-01-06 ENCOUNTER — Other Ambulatory Visit: Payer: Self-pay | Admitting: Family Medicine

## 2017-01-16 ENCOUNTER — Other Ambulatory Visit: Payer: Self-pay | Admitting: Cardiovascular Disease

## 2017-01-16 DIAGNOSIS — I482 Chronic atrial fibrillation: Secondary | ICD-10-CM | POA: Diagnosis not present

## 2017-01-16 DIAGNOSIS — Z7901 Long term (current) use of anticoagulants: Secondary | ICD-10-CM | POA: Diagnosis not present

## 2017-01-16 LAB — PROTIME-INR
INR: 2.3 — AB
Prothrombin Time: 23.8 s — ABNORMAL HIGH (ref 9.0–11.5)

## 2017-01-17 ENCOUNTER — Ambulatory Visit (INDEPENDENT_AMBULATORY_CARE_PROVIDER_SITE_OTHER): Payer: Medicare Other | Admitting: Pharmacist Clinician (PhC)/ Clinical Pharmacy Specialist

## 2017-01-17 DIAGNOSIS — I482 Chronic atrial fibrillation, unspecified: Secondary | ICD-10-CM

## 2017-01-17 DIAGNOSIS — Z8673 Personal history of transient ischemic attack (TIA), and cerebral infarction without residual deficits: Secondary | ICD-10-CM

## 2017-01-17 DIAGNOSIS — Z7901 Long term (current) use of anticoagulants: Secondary | ICD-10-CM

## 2017-02-12 ENCOUNTER — Other Ambulatory Visit: Payer: Self-pay | Admitting: Cardiovascular Disease

## 2017-02-15 ENCOUNTER — Ambulatory Visit (INDEPENDENT_AMBULATORY_CARE_PROVIDER_SITE_OTHER): Payer: Medicare Other | Admitting: Family Medicine

## 2017-02-15 ENCOUNTER — Encounter: Payer: Self-pay | Admitting: Family Medicine

## 2017-02-15 VITALS — BP 110/80 | HR 60 | Wt 181.6 lb

## 2017-02-15 DIAGNOSIS — Z7901 Long term (current) use of anticoagulants: Secondary | ICD-10-CM

## 2017-02-15 DIAGNOSIS — I10 Essential (primary) hypertension: Secondary | ICD-10-CM

## 2017-02-15 DIAGNOSIS — I69351 Hemiplegia and hemiparesis following cerebral infarction affecting right dominant side: Secondary | ICD-10-CM

## 2017-02-15 DIAGNOSIS — E7800 Pure hypercholesterolemia, unspecified: Secondary | ICD-10-CM | POA: Diagnosis not present

## 2017-02-15 DIAGNOSIS — I482 Chronic atrial fibrillation, unspecified: Secondary | ICD-10-CM

## 2017-02-15 DIAGNOSIS — R4701 Aphasia: Secondary | ICD-10-CM | POA: Diagnosis not present

## 2017-02-15 DIAGNOSIS — K219 Gastro-esophageal reflux disease without esophagitis: Secondary | ICD-10-CM

## 2017-02-15 DIAGNOSIS — E039 Hypothyroidism, unspecified: Secondary | ICD-10-CM

## 2017-02-15 DIAGNOSIS — Z8673 Personal history of transient ischemic attack (TIA), and cerebral infarction without residual deficits: Secondary | ICD-10-CM

## 2017-02-15 MED ORDER — RAMIPRIL 2.5 MG PO CAPS: 2.5000 mg | ORAL_CAPSULE | Freq: Every day | ORAL | 3 refills | Status: DC

## 2017-02-15 MED ORDER — PANTOPRAZOLE SODIUM 40 MG PO TBEC: DELAYED_RELEASE_TABLET | ORAL | 3 refills | Status: DC

## 2017-02-15 MED ORDER — SIMVASTATIN 20 MG PO TABS: ORAL_TABLET | ORAL | 3 refills | Status: DC

## 2017-02-15 MED ORDER — LEVOTHYROXINE SODIUM 112 MCG PO TABS: ORAL_TABLET | ORAL | 3 refills | Status: DC

## 2017-02-15 NOTE — Progress Notes (Signed)
   Subjective:    Patient ID: Eric Ross, male    DOB: 11/29/1955, 62 y.o.   MRN: [id number]  HPI He is here for consult concerning multiple issues.  He does have a history of CVA and subsequent expressive a aphasia as well as right-sided  hemiparesis.  He also has a history of atrial fibrillation and presently is on Coumadin.  The hemiparesis is the main qualify for disability and that he does not have functional ability to use his right arm and does walk with an antalgic type gait.  Also has expressive a aphasia.  He is followed by Dr. Alvester Chou through cardiology.  He also has an underlying history of hypothyroidism as well as hypertension.  Blood work does show hyperlipidemia.   Review of Systems     Objective:   Physical Exam Alert and in no distress. Tympanic membranes and canals are normal. Pharyngeal area is normal. Neck is supple without adenopathy or thyromegaly. Cardiac exam shows an irregular  rhythm without murmurs or gallops. Lungs are clear to auscultation.  Spasticity is noted in his right arm and abnormal gait is noted.        Assessment & Plan:  Chronic atrial fibrillation ([place]) - Plan: CBC with Differential/Platelet, Comprehensive metabolic panel, Lipid panel  History of CVA (cerebrovascular accident)  Pure hypercholesterolemia - Plan: Lipid panel, simvastatin (ZOCOR) 20 MG tablet  Long term current use of anticoagulant therapy  Hypothyroidism, unspecified type - Plan: TSH, levothyroxine (SYNTHROID, LEVOTHROID) 112 MCG tablet  Essential hypertension - Plan: CBC with Differential/Platelet, Comprehensive metabolic panel, Lipid panel, ramipril (ALTACE) 2.5 MG capsule  Hemiparesis affecting right side as late effect of stroke (HCC)  Expressive aphasia  Gastroesophageal reflux disease, esophagitis presence not specified - Plan: pantoprazole (PROTONIX) 40 MG tablet He has been permanently disabled since his CVA in 2003.

## 2017-02-16 LAB — COMPREHENSIVE METABOLIC PANEL
A/G RATIO: 2.3 — AB (ref 1.2–2.2)
ALK PHOS: 44 IU/L (ref 39–117)
ALT: 17 IU/L (ref 0–44)
AST: 26 IU/L (ref 0–40)
Albumin: 5.2 g/dL — ABNORMAL HIGH (ref 3.6–4.8)
BILIRUBIN TOTAL: 0.6 mg/dL (ref 0.0–1.2)
BUN/Creatinine Ratio: 13 (ref 10–24)
BUN: 13 mg/dL (ref 8–27)
CHLORIDE: 101 mmol/L (ref 96–106)
CO2: 21 mmol/L (ref 20–29)
Calcium: 9.2 mg/dL (ref 8.6–10.2)
Creatinine, Ser: 1.02 mg/dL (ref 0.76–1.27)
GFR calc Af Amer: 91 mL/min/{1.73_m2} (ref >59)
GFR, EST NON AFRICAN AMERICAN: 78 mL/min/{1.73_m2} (ref >59)
GLOBULIN, TOTAL: 2.3 g/dL (ref 1.5–4.5)
Glucose: 98 mg/dL (ref 65–99)
POTASSIUM: 4.6 mmol/L (ref 3.5–5.2)
SODIUM: 137 mmol/L (ref 134–144)
Total Protein: 7.5 g/dL (ref 6.0–8.5)

## 2017-02-16 LAB — LIPID PANEL
CHOL/HDL RATIO: 3.5 ratio (ref 0.0–5.0)
Cholesterol, Total: 156 mg/dL (ref 100–199)
HDL: 45 mg/dL (ref >39)
LDL Calculated: 83 mg/dL (ref 0–99)
TRIGLYCERIDES: 138 mg/dL (ref 0–149)
VLDL Cholesterol Cal: 28 mg/dL (ref 5–40)

## 2017-02-16 LAB — TSH: TSH: 0.985 u[IU]/mL (ref 0.450–4.500)

## 2017-02-16 LAB — CBC WITH DIFFERENTIAL/PLATELET
BASOS: 1 %
Basophils Absolute: 0.1 10*3/uL (ref 0.0–0.2)
EOS (ABSOLUTE): 0.2 10*3/uL (ref 0.0–0.4)
EOS: 3 %
HEMATOCRIT: 42.6 % (ref 37.5–51.0)
Hemoglobin: 14.8 g/dL (ref 13.0–17.7)
Immature Grans (Abs): 0 10*3/uL (ref 0.0–0.1)
Immature Granulocytes: 0 %
LYMPHS ABS: 1.9 10*3/uL (ref 0.7–3.1)
Lymphs: 26 %
MCH: 30.6 pg (ref 26.6–33.0)
MCHC: 34.7 g/dL (ref 31.5–35.7)
MCV: 88 fL (ref 79–97)
MONOS ABS: 0.5 10*3/uL (ref 0.1–0.9)
Monocytes: 7 %
NEUTROS ABS: 4.5 10*3/uL (ref 1.4–7.0)
Neutrophils: 63 %
PLATELETS: 196 10*3/uL (ref 150–379)
RBC: 4.84 x10E6/uL (ref 4.14–5.80)
RDW: 13.8 % (ref 12.3–15.4)
WBC: 7.2 10*3/uL (ref 3.4–10.8)

## 2017-02-18 ENCOUNTER — Telehealth: Payer: Self-pay | Admitting: Family Medicine

## 2017-02-18 NOTE — Telephone Encounter (Signed)
Disability form was completed & faxed with office visit notes, per Wife's request to [place] and original to be given back to pt and copy to be scanned in chart

## 2017-04-08 ENCOUNTER — Other Ambulatory Visit: Payer: Self-pay | Admitting: Family Medicine

## 2017-04-10 ENCOUNTER — Other Ambulatory Visit: Payer: Self-pay | Admitting: Cardiovascular Disease

## 2017-04-10 DIAGNOSIS — Z7901 Long term (current) use of anticoagulants: Secondary | ICD-10-CM | POA: Diagnosis not present

## 2017-04-10 DIAGNOSIS — I482 Chronic atrial fibrillation: Secondary | ICD-10-CM | POA: Diagnosis not present

## 2017-04-10 LAB — PROTIME-INR
INR: 3 — AB (ref 0.9–1.1)
INR: 3 — ABNORMAL HIGH
Prothrombin Time: 31.2 s — ABNORMAL HIGH (ref 9.0–11.5)

## 2017-04-11 ENCOUNTER — Ambulatory Visit (INDEPENDENT_AMBULATORY_CARE_PROVIDER_SITE_OTHER): Payer: Medicare Other | Admitting: Pharmacist

## 2017-04-11 DIAGNOSIS — Z7901 Long term (current) use of anticoagulants: Secondary | ICD-10-CM | POA: Diagnosis not present

## 2017-04-11 DIAGNOSIS — I482 Chronic atrial fibrillation, unspecified: Secondary | ICD-10-CM

## 2017-04-11 DIAGNOSIS — Z8673 Personal history of transient ischemic attack (TIA), and cerebral infarction without residual deficits: Secondary | ICD-10-CM

## 2017-04-19 ENCOUNTER — Telehealth: Payer: Self-pay | Admitting: Cardiovascular Disease

## 2017-04-19 NOTE — Telephone Encounter (Signed)
Routed to T. [place] CMA There is no phone note in Epic stating patient dropped off paperwork

## 2017-04-19 NOTE — Telephone Encounter (Signed)
Received form and will have Dr. Gwenlyn Found sign on Tuesday.

## 2017-04-19 NOTE — Telephone Encounter (Signed)
New Message   Pt wife is calling to see when she can pick up the [employer] cap renewal form she dropped off a week ago. Please call

## 2017-04-26 NOTE — Telephone Encounter (Signed)
Follow up   Patient spouse calling regarding placard renewal.

## 2017-04-29 NOTE — Telephone Encounter (Signed)
Spoke to pt wife to inform that disability parking form has been filled out and signed by Dr. Gwenlyn Found. She stated she would like to come by and pick it up tomorrow. Explained it would be ready for pick up at the front desk. Pt wife verbalized thanks and understanding.

## 2017-06-17 ENCOUNTER — Other Ambulatory Visit: Payer: Self-pay | Admitting: Cardiovascular Disease

## 2017-06-17 DIAGNOSIS — I482 Chronic atrial fibrillation: Secondary | ICD-10-CM | POA: Diagnosis not present

## 2017-06-17 DIAGNOSIS — Z7901 Long term (current) use of anticoagulants: Secondary | ICD-10-CM | POA: Diagnosis not present

## 2017-06-18 ENCOUNTER — Ambulatory Visit (INDEPENDENT_AMBULATORY_CARE_PROVIDER_SITE_OTHER): Payer: Medicare Other | Admitting: Pharmacist Clinician (PhC)/ Clinical Pharmacy Specialist

## 2017-06-18 DIAGNOSIS — I482 Chronic atrial fibrillation, unspecified: Secondary | ICD-10-CM

## 2017-06-18 DIAGNOSIS — Z8673 Personal history of transient ischemic attack (TIA), and cerebral infarction without residual deficits: Secondary | ICD-10-CM | POA: Diagnosis not present

## 2017-06-18 DIAGNOSIS — Z7901 Long term (current) use of anticoagulants: Secondary | ICD-10-CM

## 2017-06-18 LAB — PROTIME-INR
INR: 3.3 — ABNORMAL HIGH
PROTHROMBIN TIME: 35 s — AB (ref 9.0–11.5)

## 2017-07-01 ENCOUNTER — Other Ambulatory Visit: Payer: Self-pay | Admitting: Family Medicine

## 2017-08-05 ENCOUNTER — Other Ambulatory Visit: Payer: Self-pay | Admitting: Cardiovascular Disease

## 2017-08-20 ENCOUNTER — Other Ambulatory Visit: Payer: Self-pay | Admitting: Cardiovascular Disease

## 2017-08-20 DIAGNOSIS — I482 Chronic atrial fibrillation: Secondary | ICD-10-CM | POA: Diagnosis not present

## 2017-08-20 DIAGNOSIS — Z7901 Long term (current) use of anticoagulants: Secondary | ICD-10-CM | POA: Diagnosis not present

## 2017-08-21 LAB — PROTIME-INR
INR: 2.5 — ABNORMAL HIGH
PROTHROMBIN TIME: 26 s — AB (ref 9.0–11.5)

## 2017-08-22 ENCOUNTER — Ambulatory Visit (INDEPENDENT_AMBULATORY_CARE_PROVIDER_SITE_OTHER): Payer: Medicare Other | Admitting: Pharmacist Clinician (PhC)/ Clinical Pharmacy Specialist

## 2017-08-22 DIAGNOSIS — Z7901 Long term (current) use of anticoagulants: Secondary | ICD-10-CM | POA: Diagnosis not present

## 2017-08-22 DIAGNOSIS — I482 Chronic atrial fibrillation, unspecified: Secondary | ICD-10-CM

## 2017-08-22 DIAGNOSIS — Z8673 Personal history of transient ischemic attack (TIA), and cerebral infarction without residual deficits: Secondary | ICD-10-CM

## 2017-09-06 ENCOUNTER — Encounter: Payer: Self-pay | Admitting: Family Medicine

## 2017-09-06 ENCOUNTER — Ambulatory Visit (INDEPENDENT_AMBULATORY_CARE_PROVIDER_SITE_OTHER): Payer: Medicare Other | Admitting: Family Medicine

## 2017-09-06 VITALS — BP 102/78 | HR 78 | Temp 97.4°F | Wt 179.8 lb

## 2017-09-06 DIAGNOSIS — I482 Chronic atrial fibrillation, unspecified: Secondary | ICD-10-CM

## 2017-09-06 DIAGNOSIS — M25532 Pain in left wrist: Secondary | ICD-10-CM | POA: Diagnosis not present

## 2017-09-06 DIAGNOSIS — M25562 Pain in left knee: Secondary | ICD-10-CM | POA: Diagnosis not present

## 2017-09-06 DIAGNOSIS — M545 Low back pain, unspecified: Secondary | ICD-10-CM

## 2017-09-06 DIAGNOSIS — G8929 Other chronic pain: Secondary | ICD-10-CM

## 2017-09-06 MED ORDER — TRAMADOL HCL 50 MG PO TABS: 50.0000 mg | ORAL_TABLET | Freq: Three times a day (TID) | ORAL | 0 refills | Status: DC | PRN

## 2017-09-06 NOTE — Progress Notes (Signed)
   Subjective:    Patient ID: Eric Ross, male    DOB: [birth year], 62 y.o.   MRN: [id number]  HPI He is here for evaluation of multiple areas of pain.  He has a long history of back knee and wrist pain that has gotten worse within the last several weeks.  Of note is that recently his mother-in-law died and he did do a lot of lifting to help with her care.  He also has an underlying history of atrial fibrillation and is on Coumadin.  His wife has been given him his low doses of ibuprofen 3 pills twice per day.  He also has a history of previous left knee surgery with screws in.  He states that his pain in the left knee is mainly when the screws are.   Review of Systems     Objective:   Physical Exam Tender to palpation of the back but no point tenderness in any one area.  Good lumbar motion.  Hip motion is normal.  Reflexes are normal with negative straight leg raising.  The left wrist does show tenderness over the carpal bones with pain on motion but no effusion is noted.  Strength seems normal.  Left knee exam shows no effusion.  He is tender to palpation over his surgical scar area distally.  Good motion of his knee.       Assessment & Plan:  Chronic low back pain without sciatica, unspecified back pain laterality - Plan: DG Lumbar Spine Complete, traMADol (ULTRAM) 50 MG tablet, Ambulatory referral to Orthopedic Surgery  Chronic atrial fibrillation (HCC)  Left wrist pain - Plan: DG Wrist Complete Left, traMADol (ULTRAM) 50 MG tablet, Ambulatory referral to Orthopedic Surgery  Chronic pain of left knee - Plan: DG Knee Complete 4 Views Left, traMADol (ULTRAM) 50 MG tablet, Ambulatory referral to Orthopedic Surgery The surgery on his knee was done by Dr. Mayer Camel and I will refer back care to have the knee evaluated for possible removal of the screws.  We will also give tramadol to help with the pain and have Dr. Mayer Camel look at his overall orthopedic condition.  He and his wife are very  aware of taking ibuprofen as well as Coumadin.

## 2017-09-09 ENCOUNTER — Other Ambulatory Visit: Payer: Self-pay | Admitting: Cardiovascular Disease

## 2017-09-10 ENCOUNTER — Telehealth: Payer: Self-pay

## 2017-09-10 NOTE — Telephone Encounter (Signed)
Called pt to ask if he had been to get his x-rays done due to no results . No answer lvm. Eric Ross

## 2017-09-10 NOTE — Telephone Encounter (Signed)
Called pt wife back to advise pt should go ahead have X rays done and we will call back with results and they should be on look out for a call from Ortho to make appt. [place]

## 2017-09-11 ENCOUNTER — Ambulatory Visit
Admission: RE | Admit: 2017-09-11 | Discharge: 2017-09-11 | Disposition: A | Discharge status: Home / Self Care | Payer: Medicare Other | Source: Ambulatory Visit | Attending: Family Medicine | Admitting: Family Medicine

## 2017-09-11 DIAGNOSIS — M19032 Primary osteoarthritis, left wrist: Secondary | ICD-10-CM | POA: Diagnosis not present

## 2017-09-11 DIAGNOSIS — M545 Low back pain: Secondary | ICD-10-CM | POA: Diagnosis not present

## 2017-09-11 DIAGNOSIS — G8929 Other chronic pain: Secondary | ICD-10-CM

## 2017-09-11 DIAGNOSIS — M25562 Pain in left knee: Secondary | ICD-10-CM | POA: Diagnosis not present

## 2017-09-25 ENCOUNTER — Other Ambulatory Visit: Payer: Self-pay | Admitting: Cardiovascular Disease

## 2017-09-25 DIAGNOSIS — I482 Chronic atrial fibrillation: Secondary | ICD-10-CM | POA: Diagnosis not present

## 2017-09-25 DIAGNOSIS — Z7901 Long term (current) use of anticoagulants: Secondary | ICD-10-CM | POA: Diagnosis not present

## 2017-09-25 DIAGNOSIS — M79642 Pain in left hand: Secondary | ICD-10-CM | POA: Diagnosis not present

## 2017-09-25 LAB — PROTIME-INR
INR: 3.3 — AB
PROTHROMBIN TIME: 34.3 s — AB (ref 9.0–11.5)

## 2017-09-26 ENCOUNTER — Ambulatory Visit (INDEPENDENT_AMBULATORY_CARE_PROVIDER_SITE_OTHER): Payer: Medicare Other | Admitting: Pharmacist Clinician (PhC)/ Clinical Pharmacy Specialist

## 2017-09-26 DIAGNOSIS — I482 Chronic atrial fibrillation, unspecified: Secondary | ICD-10-CM

## 2017-09-26 DIAGNOSIS — Z8673 Personal history of transient ischemic attack (TIA), and cerebral infarction without residual deficits: Secondary | ICD-10-CM

## 2017-09-26 DIAGNOSIS — Z7901 Long term (current) use of anticoagulants: Secondary | ICD-10-CM

## 2017-09-27 ENCOUNTER — Other Ambulatory Visit: Payer: Self-pay | Admitting: Family Medicine

## 2017-10-02 ENCOUNTER — Encounter: Payer: Self-pay | Admitting: Physical Therapy

## 2017-10-02 ENCOUNTER — Ambulatory Visit (INDEPENDENT_AMBULATORY_CARE_PROVIDER_SITE_OTHER): Payer: Medicare Other | Admitting: Physical Therapy

## 2017-10-02 DIAGNOSIS — R293 Abnormal posture: Secondary | ICD-10-CM

## 2017-10-02 DIAGNOSIS — G8929 Other chronic pain: Secondary | ICD-10-CM | POA: Diagnosis not present

## 2017-10-02 DIAGNOSIS — M545 Low back pain: Secondary | ICD-10-CM

## 2017-10-02 NOTE — Patient Instructions (Signed)
Access Code: [email or web address]  URL: [email or web address]  Date: 10/02/2017  Prepared by: Faustino Congress   Exercises  Supine Lower Trunk Rotation - 3 reps - 1 sets - 30 sec hold - 2x daily - 7x weekly  Supine Piriformis Stretch with Foot on Ground - 3 reps - 1 sets - 30 sec hold - 2x daily - 7x weekly  Hooklying Single Knee to Chest Stretch - 10 reps - 3 sets - 30 sec hold - 1x daily - 7x weekly  Supine Hamstring Stretch with Strap - 3 reps - 1 sets - 30 sec hold - 2x daily - 7x weekly  Supine Hip Adductor Stretch - 3 reps - 1 sets - 30 sec hold - 2x daily - 7x weekly

## 2017-10-02 NOTE — Therapy (Signed)
[place], [place], [address] Phone: [phone]   Fax:  [phone]  Physical Therapy Evaluation  Patient Details  Name: Eric Ross MRN: [id number] Date of Birth: 1955/09/25 Referring Provider: Frederik Pear, MD   Encounter Date: 10/02/2017  PT End of Session - 10/02/17 1224    Visit Number  1    Number of Visits  12    Date for PT Re-Evaluation  11/13/17    Authorization Type  Medicare    PT Start Time  1120    PT Stop Time  1200    PT Time Calculation (min)  40 min    Activity Tolerance  Patient tolerated treatment well    Behavior During Therapy  [facility] for tasks assessed/performed       Past Medical History:  Diagnosis Date  . Atrial fibrillation ([place])    Dr. Gwenlyn Found  . Cardiomyopathy    nonischemic  . Erectile dysfunction   . History of thyroid cancer   . Hyperlipidemia   . Hypothyroidism   . Pseudogout   . Stroke [facility]) 2003    Past Surgical History:  Procedure Laterality Date  . CARDIAC CATHETERIZATION  09/22/01   NORMAL CORONARY ARTERIES  . JOINT REPLACEMENT     BILATERAL KNEE   . LEXISCAN MYOCARDIAL PERFUSION STUDY  05/30/11   NORMAL MYOCARDIAL PERFUSION STUDY EF% 49%.  . THYROIDECTOMY    . TRANSTHORACIC ECHOCARDIOGRAM  05/30/11   EF%=45-50%. NORMAL LV WALL THICKNESS. RV IS MILDLY DILATED. NO SIGNIFICANT VALVE DISEASE.     There were no vitals filed for this visit.   Subjective Assessment - 10/02/17 1123    Subjective  Pt is a 62 y/o male who presents to OPPT for low back pain.  Pt also complains of knee pain and Lt wrist pain (outside scope of referral).  Pt reports he used to ride dirt bikes and therefore has arthritis in many joints.  Pt reports back pain limiting walking and lifting.    Pertinent History  CVA 2003 (expressive aphasia (mild receptive aphasia), Rt sided weakness UE more involved than LE), arthritis, afib, cardiomyopathy    Limitations   Sitting;Lifting;Walking    How long can you sit comfortably?  1 hour    How long can you walk comfortably?  20-30 min    Diagnostic tests  xrays: arthritis    Patient Stated Goals  improve pain    Currently in Pain?  Yes    Pain Score  9    at best 5/10; worst 10/10   Pain Location  Back    Pain Orientation  Lower;Mid    Pain Descriptors / Indicators  Sharp    Pain Type  Chronic pain    Pain Onset  More than a month ago    Pain Frequency  Constant    Aggravating Factors   sitting, lifting, mowing the lawn, walking    Pain Relieving Factors  patches, repositioning         [facility] PT Assessment - 10/02/17 1132      Assessment   Medical Diagnosis  LBP, tight adductors    Referring Provider  Frederik Pear, MD    Onset Date/Surgical Date  --   > 30 years   Next MD Visit  10/23/17    Prior Therapy  none      Precautions   Precautions  None      Restrictions   Weight Bearing Restrictions  No  Balance Screen   Has the patient fallen in the past 6 months  Yes    How many times?  3-climbing ladder, fall in kitchen (unsupervised), tripped over stool    Has the patient had a decrease in activity level because of a fear of falling?   No    Is the patient reluctant to leave their home because of a fear of falling?   No      Home Environment   Living Environment  Private residence    Living Arrangements  Spouse/significant other    Type of [place] to enter    Entrance Stairs-Number of Steps  5    Entrance Stairs-Rails  Right    [place]  Two level;Able to live on main level with bedroom/bathroom    Additional Comments  no difficulty with stairs      Prior Function   Level of Independence  Independent with basic ADLs    Vocation  On disability    Leisure  hunting, yardwork      Observation/Other Assessments   Focus on Therapeutic Outcomes (FOTO)   50 (50% limited; predicted 44% limited)      Posture/Postural Control   Posture/Postural Control   Postural limitations    Postural Limitations  Decreased lumbar lordosis      ROM / Strength   AROM / PROM / Strength  AROM;Strength      AROM   Overall AROM Comments  thoracolumbar: flexion limited ~ 50%, all other motions limited 25% with no change in pain      Strength   Strength Assessment Site  Hip;Knee;Ankle    Right/Left Hip  Right;Left    Right Hip Flexion  4/5    Right Hip Extension  3/5    Right Hip ABduction  4/5    Left Hip Flexion  5/5    Left Hip Extension  3/5    Left Hip ABduction  5/5    Right/Left Knee  Right;Left    Right Knee Flexion  3/5    Right Knee Extension  5/5    Left Knee Flexion  5/5    Left Knee Extension  5/5    Right/Left Ankle  Right;Left    Right Ankle Dorsiflexion  4/5    Left Ankle Dorsiflexion  5/5      Flexibility   Soft Tissue Assessment /Muscle Length  yes   tight adductors and hip flexors bil   Hamstrings  tightness Rt>Lt    Piriformis  tightness Rt>Lt      Palpation   Palpation comment  point tenderness along L3-5 spinous processes; hypomobility lumbar spine                Objective measurements completed on examination: See above findings.      [facility] Adult PT Treatment/Exercise - 10/02/17 1132      Exercises   Exercises  Lumbar      Lumbar Exercises: Stretches   Passive Hamstring Stretch  Right;Left;1 rep;30 seconds    Single Knee to Chest Stretch  Right;Left;1 rep;30 seconds    Lower Trunk Rotation  1 rep;30 seconds    Piriformis Stretch  Right;Left;1 rep;30 seconds    Other Lumbar Stretch Exercise  supine adductor stretch 1 x 30 sec (instructed wife in overpressure)             PT Education - 10/02/17 1223    Education Details  HEP    Person(s)  Educated  Patient    Methods  Explanation;Demonstration;Handout;Tactile cues    Comprehension  Returned demonstration;Verbalized understanding;Tactile cues required;Need further instruction          PT Long Term Goals - 10/02/17 1235      PT LONG  TERM GOAL #1   Title  independent with HEP    Status  New    Target Date  11/13/17      PT LONG TERM GOAL #2   Title  improve lumbar flexion to </= 25% limited for improved flexibility    Status  New    Target Date  11/13/17      PT LONG TERM GOAL #3   Title  FOTO score improved to </= 44% limitation    Status  New    Target Date  11/13/17      PT LONG TERM GOAL #4   Title  report ability to perform housework and yardwork with pain < 6/10 for improved function    Status  New    Target Date  11/13/17             Plan - 10/02/17 1224    Clinical Impression Statement  Pt is a 62 y/o male who presents to OPPT for chronic LBP x 30 years.  Pt demonstrates decreased ROM and flexibility, mild strength deficits (likely residual from CVA), and postural abnormalities affecting pain and functional mobility.  Pt will benefit from PT to address deficits listed.    History and Personal Factors relevant to plan of care:  CVA (2003), arthritis, afib    Clinical Presentation  Evolving    Clinical Presentation due to:  chronicity, unknown cause of exacerbation    Clinical Decision Making  Moderate    Rehab Potential  Good    PT Frequency  2x / week    PT Duration  6 weeks    PT Treatment/Interventions  ADLs/Self Care Home Management;Cryotherapy;Electrical Stimulation;Moist Heat;Traction;Therapeutic exercise;Therapeutic activities;Functional mobility training;Stair training;Gait training;Ultrasound;Neuromuscular re-education;Patient/family education;Manual techniques;Dry needling;Passive range of motion    PT Next Visit Plan  review HEP; progress mobility and core/hip strengthening, general lumbar flexibility, manual/modalities PRN    PT Home Exercise Plan  Access Code: [email or web address]     Consulted and Agree with Plan of Care  Patient;Family member/caregiver    Family Member Consulted  wife       Patient will benefit from skilled therapeutic intervention in order to improve the following deficits  and impairments:  Hypomobility, Pain, Increased fascial restricitons, Increased muscle spasms, Decreased strength, Decreased mobility, Decreased range of motion, Impaired flexibility, Postural dysfunction  Visit Diagnosis: Chronic midline low back pain without sciatica - Plan: PT plan of care cert/re-cert  Abnormal posture - Plan: PT plan of care cert/re-cert     Problem List Patient Active Problem List   Diagnosis Date Noted  . Hemiparesis affecting right side as late effect of stroke ([place]) 02/15/2017  . Expressive aphasia 02/15/2017  . Erectile dysfunction 04/25/2016  . Hypothyroidism 04/25/2016  . Gastroesophageal reflux disease 04/25/2016  . Hyperlipidemia 11/03/2014  . Atrial fibrillation ([place]) 04/23/2012  . Long term current use of anticoagulant therapy 04/23/2012  . History of CVA (cerebrovascular accident) 04/23/2012  . Allergic rhinitis 12/14/2010  . Essential hypertension 07/05/2008      Laureen Abrahams, PT, DPT 10/02/17 12:39 PM    [facility] [place], [place], [address] Phone: [phone]   Fax:  [phone]  Name: LORRY ANASTASI MRN: [id number]  Date of Birth: 1955-06-16

## 2017-10-03 ENCOUNTER — Other Ambulatory Visit: Payer: Self-pay | Admitting: Cardiovascular Disease

## 2017-10-04 ENCOUNTER — Encounter: Payer: Self-pay | Admitting: Rehabilitative and Restorative Service Providers"

## 2017-10-04 ENCOUNTER — Ambulatory Visit (INDEPENDENT_AMBULATORY_CARE_PROVIDER_SITE_OTHER): Payer: Medicare Other | Admitting: Rehabilitative and Restorative Service Providers"

## 2017-10-04 DIAGNOSIS — G8929 Other chronic pain: Secondary | ICD-10-CM | POA: Diagnosis not present

## 2017-10-04 DIAGNOSIS — R293 Abnormal posture: Secondary | ICD-10-CM

## 2017-10-04 DIAGNOSIS — M545 Low back pain, unspecified: Secondary | ICD-10-CM

## 2017-10-04 NOTE — Patient Instructions (Signed)
Access Code: [email or web address]  URL: [email or web address]  Date: 10/04/2017  Prepared by: Gillermo Murdoch   Exercises  Supine Double Knee to Chest - 3 reps - 1 sets - 30 sec hold - 1x daily - 7x weekly  Supine Bridge - 10 reps - 1 sets - 5 sec hold - 1x daily - 7x weekly  Hooklying Isometric Clamshell - 10 reps - 1 sets - 3 sec hold - 1x daily - 7x weekly

## 2017-10-04 NOTE — Therapy (Addendum)
[place], [place], [address] Phone: [phone]   Fax:  [phone]  Physical Therapy Treatment  Patient Details  Name: Eric Ross MRN: [id number] Date of Birth: February 21, 1955 Referring Provider: Frederik Pear, MD   Encounter Date: 10/04/2017  PT End of Session - 10/04/17 1332    Visit Number  2    Number of Visits  12    Date for PT Re-Evaluation  11/13/17    Authorization Type  Medicare    PT Start Time  6546    PT Stop Time  1429   moist heat x 15 in post exercise - no charge    PT Time Calculation (min)  56 min    Activity Tolerance  Patient tolerated treatment well       Past Medical History:  Diagnosis Date  . Atrial fibrillation ([place])    Dr. Gwenlyn Found  . Cardiomyopathy    nonischemic  . Erectile dysfunction   . History of thyroid cancer   . Hyperlipidemia   . Hypothyroidism   . Pseudogout   . Stroke [facility]) 2003    Past Surgical History:  Procedure Laterality Date  . CARDIAC CATHETERIZATION  09/22/01   NORMAL CORONARY ARTERIES  . JOINT REPLACEMENT     BILATERAL KNEE   . LEXISCAN MYOCARDIAL PERFUSION STUDY  05/30/11   NORMAL MYOCARDIAL PERFUSION STUDY EF% 49%.  . THYROIDECTOMY    . TRANSTHORACIC ECHOCARDIOGRAM  05/30/11   EF%=45-50%. NORMAL LV WALL THICKNESS. RV IS MILDLY DILATED. NO SIGNIFICANT VALVE DISEASE.     There were no vitals filed for this visit.  Subjective Assessment - 10/04/17 1333    Subjective  Patient reports that he did the exercises - back hurts the same. Patient and wife report that he was out working doing physical work yesterday outside - getting ready for hunting season.     Currently in Pain?  Yes    Pain Score  7     Pain Location  Back    Pain Orientation  Lower;Mid    Pain Descriptors / Indicators  Sore    Pain Type  Chronic pain    Pain Onset  More than a month ago    Pain Frequency  Constant                       [facility] Adult PT  Treatment/Exercise - 10/04/17 0001      Lumbar Exercises: Stretches   Passive Hamstring Stretch  Right;Left;3 reps;30 seconds   supine with strap/PT assist    Single Knee to Chest Stretch  Right;Left;3 reps;30 seconds    Double Knee to Chest Stretch  3 reps;30 seconds    Lower Trunk Rotation  3 reps;30 seconds   hooklying position   Standing Side Bend Limitations  sitting lateral trnk flexion 20-30 sec x 3 each direction propping on forearm     Other Lumbar Stretch Exercise  seated forward trunk flexion 20 sec x 1 - increased back pain - stopped       Lumbar Exercises: Aerobic   Nustep  L5 x 6 min UE (10)      Lumbar Exercises: Supine   Clam  10 reps;3 seconds   holding one knee still moving opposite - blue TB above knees   Bridge  10 reps;5 seconds      Moist Heat Therapy   Number Minutes Moist Heat  15 Minutes    Moist Heat Location  Lumbar Spine   pt supine LE's resting on bolster             PT Education - 10/04/17 1402    Education Details  HEP     Person(s) Educated  Patient    Methods  Explanation;Demonstration;Tactile cues;Verbal cues;Handout    Comprehension  Verbalized understanding;Returned demonstration;Verbal cues required;Tactile cues required          PT Long Term Goals - 10/02/17 1235      PT LONG TERM GOAL #1   Title  independent with HEP    Status  New    Target Date  11/13/17      PT LONG TERM GOAL #2   Title  improve lumbar flexion to </= 25% limited for improved flexibility    Status  New    Target Date  11/13/17      PT LONG TERM GOAL #3   Title  FOTO score improved to </= 44% limitation    Status  New    Target Date  11/13/17      PT LONG TERM GOAL #4   Title  report ability to perform housework and yardwork with pain < 6/10 for improved function    Status  New    Target Date  11/13/17            Plan - 10/04/17 1340    Clinical Impression Statement  Reviewed HEP and added stabilization exercises with verbal cues and  instruction. Some c/o back pain with exercises. Wife present for exercises and home instruction.     Rehab Potential  Good    PT Frequency  2x / week    PT Duration  6 weeks    PT Treatment/Interventions  ADLs/Self Care Home Management;Cryotherapy;Electrical Stimulation;Moist Heat;Traction;Therapeutic exercise;Therapeutic activities;Functional mobility training;Stair training;Gait training;Ultrasound;Neuromuscular re-education;Patient/family education;Manual techniques;Dry needling;Passive range of motion    PT Next Visit Plan  review HEP; progress mobility and core/hip strengthening, general lumbar flexibility, manual/modalities PRN    PT Home Exercise Plan  Access Code: [email or web address] : Access Code: [email or web address]     Consulted and Agree with Plan of Care  Patient    Family Member Consulted  wife       Patient will benefit from skilled therapeutic intervention in order to improve the following deficits and impairments:  Hypomobility, Pain, Increased fascial restricitons, Increased muscle spasms, Decreased strength, Decreased mobility, Decreased range of motion, Impaired flexibility, Postural dysfunction  Visit Diagnosis: Chronic midline low back pain without sciatica  Abnormal posture     Problem List Patient Active Problem List   Diagnosis Date Noted  . Hemiparesis affecting right side as late effect of stroke ([place]) 02/15/2017  . Expressive aphasia 02/15/2017  . Erectile dysfunction 04/25/2016  . Hypothyroidism 04/25/2016  . Gastroesophageal reflux disease 04/25/2016  . Hyperlipidemia 11/03/2014  . Atrial fibrillation ([place]) 04/23/2012  . Long term current use of anticoagulant therapy 04/23/2012  . History of CVA (cerebrovascular accident) 04/23/2012  . Allergic rhinitis 12/14/2010  . Essential hypertension 07/05/2008    [name] PT, MPH  10/04/2017, 2:25 PM  [facility] [place], [place], [address] Phone:  [phone]   Fax:  [phone]  Name: TANVIR HIPPLE MRN: [id number] Date of Birth: 1955/06/07

## 2017-10-09 ENCOUNTER — Ambulatory Visit (INDEPENDENT_AMBULATORY_CARE_PROVIDER_SITE_OTHER): Payer: Medicare Other | Admitting: Physical Therapy

## 2017-10-09 ENCOUNTER — Encounter: Payer: Self-pay | Admitting: Physical Therapy

## 2017-10-09 DIAGNOSIS — M545 Low back pain, unspecified: Secondary | ICD-10-CM

## 2017-10-09 DIAGNOSIS — Z23 Encounter for immunization: Secondary | ICD-10-CM | POA: Diagnosis not present

## 2017-10-09 DIAGNOSIS — R293 Abnormal posture: Secondary | ICD-10-CM

## 2017-10-09 DIAGNOSIS — G8929 Other chronic pain: Secondary | ICD-10-CM

## 2017-10-09 NOTE — Therapy (Signed)
[place], [place], [address] Phone: [phone]   Fax:  [phone]  Physical Therapy Treatment  Patient Details  Name: Eric Ross MRN: [id number] Date of Birth: [birth year] Referring Provider: Frederik Pear, MD   Encounter Date: 10/09/2017  PT End of Session - 10/09/17 1555    Visit Number  3    Number of Visits  12    Date for PT Re-Evaluation  11/13/17    Authorization Type  Medicare    PT Start Time  2683    PT Stop Time  1555    PT Time Calculation (min)  40 min    Activity Tolerance  Patient tolerated treatment well    Behavior During Therapy  [facility] for tasks assessed/performed       Past Medical History:  Diagnosis Date  . Atrial fibrillation ([place])    Dr. Gwenlyn Found  . Cardiomyopathy    nonischemic  . Erectile dysfunction   . History of thyroid cancer   . Hyperlipidemia   . Hypothyroidism   . Pseudogout   . Stroke [facility]) 2003    Past Surgical History:  Procedure Laterality Date  . CARDIAC CATHETERIZATION  09/22/01   NORMAL CORONARY ARTERIES  . JOINT REPLACEMENT     BILATERAL KNEE   . LEXISCAN MYOCARDIAL PERFUSION STUDY  05/30/11   NORMAL MYOCARDIAL PERFUSION STUDY EF% 49%.  . THYROIDECTOMY    . TRANSTHORACIC ECHOCARDIOGRAM  05/30/11   EF%=45-50%. NORMAL LV WALL THICKNESS. RV IS MILDLY DILATED. NO SIGNIFICANT VALVE DISEASE.     There were no vitals filed for this visit.  Subjective Assessment - 10/09/17 1518    Subjective  went hunting (bow hunting).  back is not better, reports it is about the same.    Patient Stated Goals  improve pain    Currently in Pain?  Yes    Pain Score  8     Pain Location  Back    Pain Orientation  Lower;Mid    Pain Descriptors / Indicators  Sore    Pain Type  Chronic pain    Pain Onset  More than a month ago    Pain Frequency  Constant    Aggravating Factors   sitting, lifting, mowing the lawn, walking    Pain Relieving Factors  patches,  repositioning                       [facility] Adult PT Treatment/Exercise - 10/09/17 1522      Ambulation/Gait   Gait Comments  trialed simulated foot up AFO for foot clearance with improved Rt foot clearance with gait, recommended pt purchase for home      Exercises   Exercises  Lumbar      Lumbar Exercises: Stretches   Passive Hamstring Stretch  Right;Left;3 reps;30 seconds    Single Knee to Chest Stretch  Right;Left;3 reps;30 seconds    Lower Trunk Rotation  3 reps;30 seconds      Lumbar Exercises: Aerobic   Nustep  L5 x 6 min UE (10)   PT present to discuss progress     Lumbar Exercises: Seated   Long Arc Quad on Chair  Right;Weights   7 reps   LAQ on Chair Weights (lbs)  2    Other Seated Lumbar Exercises  hip flexion Rt x20; 2#    Other Seated Lumbar Exercises  attempted pelvic tilts on red physioball and mat table; pt needing mod to  max cues with poor carryover      Lumbar Exercises: Supine   Pelvic Tilt  10 reps;5 seconds      Moist Heat Therapy   Number Minutes Moist Heat  15 Minutes    Moist Heat Location  Lumbar Spine   during supine exercises                 PT Long Term Goals - 10/02/17 1235      PT LONG TERM GOAL #1   Title  independent with HEP    Status  New    Target Date  11/13/17      PT LONG TERM GOAL #2   Title  improve lumbar flexion to </= 25% limited for improved flexibility    Status  New    Target Date  11/13/17      PT LONG TERM GOAL #3   Title  FOTO score improved to </= 44% limitation    Status  New    Target Date  11/13/17      PT LONG TERM GOAL #4   Title  report ability to perform housework and yardwork with pain < 6/10 for improved function    Status  New    Target Date  11/13/17            Plan - 10/09/17 1556    Clinical Impression Statement  Pt needing mod to max cues for exercises.  At this time no goals met and pt feels pain is unchanged.  Despite high pain levels reported pt continues to  do yardwork and goes hunting.  Recommend foot up AFO for foot clearance and to help with gait mechanics.    Rehab Potential  Good    PT Frequency  2x / week    PT Duration  6 weeks    PT Treatment/Interventions  ADLs/Self Care Home Management;Cryotherapy;Electrical Stimulation;Moist Heat;Traction;Therapeutic exercise;Therapeutic activities;Functional mobility training;Stair training;Gait training;Ultrasound;Neuromuscular re-education;Patient/family education;Manual techniques;Dry needling;Passive range of motion    PT Next Visit Plan  review HEP; progress mobility and core/hip strengthening, general lumbar flexibility, manual/modalities PRN    PT Home Exercise Plan  Access Code: [email or web address] : Access Code: [email or web address]     Consulted and Agree with Plan of Care  Patient    Family Member Consulted  wife       Patient will benefit from skilled therapeutic intervention in order to improve the following deficits and impairments:  Hypomobility, Pain, Increased fascial restricitons, Increased muscle spasms, Decreased strength, Decreased mobility, Decreased range of motion, Impaired flexibility, Postural dysfunction  Visit Diagnosis: Chronic midline low back pain without sciatica  Abnormal posture     Problem List Patient Active Problem List   Diagnosis Date Noted  . Hemiparesis affecting right side as late effect of stroke ([place]) 02/15/2017  . Expressive aphasia 02/15/2017  . Erectile dysfunction 04/25/2016  . Hypothyroidism 04/25/2016  . Gastroesophageal reflux disease 04/25/2016  . Hyperlipidemia 11/03/2014  . Atrial fibrillation ([place]) 04/23/2012  . Long term current use of anticoagulant therapy 04/23/2012  . History of CVA (cerebrovascular accident) 04/23/2012  . Allergic rhinitis 12/14/2010  . Essential hypertension 07/05/2008      Laureen Abrahams, PT, DPT 10/09/17 3:57 PM     [facility] [place],  [place], [address] Phone: [phone]   Fax:  [phone]  Name: Eric Ross MRN: [id number] Date of Birth: 1955/07/08

## 2017-10-11 ENCOUNTER — Encounter: Payer: Self-pay | Admitting: Physical Therapy

## 2017-10-11 ENCOUNTER — Ambulatory Visit (INDEPENDENT_AMBULATORY_CARE_PROVIDER_SITE_OTHER): Payer: Medicare Other | Admitting: Physical Therapy

## 2017-10-11 DIAGNOSIS — R293 Abnormal posture: Secondary | ICD-10-CM

## 2017-10-11 DIAGNOSIS — M545 Low back pain, unspecified: Secondary | ICD-10-CM

## 2017-10-11 DIAGNOSIS — G8929 Other chronic pain: Secondary | ICD-10-CM | POA: Diagnosis not present

## 2017-10-11 NOTE — Therapy (Signed)
[place], [place], [address] Phone: [phone]   Fax:  [phone]  Physical Therapy Treatment  Patient Details  Name: Eric Ross MRN: [id number] Date of Birth: [birth year] Referring Provider: Frederik Pear, MD   Encounter Date: 10/11/2017  PT End of Session - 10/11/17 1108    Visit Number  4    Number of Visits  12    Date for PT Re-Evaluation  11/13/17    Authorization Type  Medicare    PT Start Time  1030    PT Stop Time  1108    PT Time Calculation (min)  38 min    Activity Tolerance  Patient tolerated treatment well    Behavior During Therapy  [facility] for tasks assessed/performed       Past Medical History:  Diagnosis Date  . Atrial fibrillation ([place])    Dr. Gwenlyn Found  . Cardiomyopathy    nonischemic  . Erectile dysfunction   . History of thyroid cancer   . Hyperlipidemia   . Hypothyroidism   . Pseudogout   . Stroke [facility]) 2003    Past Surgical History:  Procedure Laterality Date  . CARDIAC CATHETERIZATION  09/22/01   NORMAL CORONARY ARTERIES  . JOINT REPLACEMENT     BILATERAL KNEE   . LEXISCAN MYOCARDIAL PERFUSION STUDY  05/30/11   NORMAL MYOCARDIAL PERFUSION STUDY EF% 49%.  . THYROIDECTOMY    . TRANSTHORACIC ECHOCARDIOGRAM  05/30/11   EF%=45-50%. NORMAL LV WALL THICKNESS. RV IS MILDLY DILATED. NO SIGNIFICANT VALVE DISEASE.     There were no vitals filed for this visit.  Subjective Assessment - 10/11/17 1034    Subjective  back feels "worse" but then rated 6/10 which is better than previous session.  continues to go hunting    Patient Stated Goals  improve pain    Currently in Pain?  Yes    Pain Score  6     Pain Location  Back    Pain Orientation  Lower;Mid    Pain Descriptors / Indicators  Sore    Pain Type  Chronic pain    Pain Onset  More than a month ago    Pain Frequency  Constant    Aggravating Factors   sitting, lifting, mowing the lawn, walking    Pain Relieving  Factors  patches, repositioning                       [facility] Adult PT Treatment/Exercise - 10/11/17 1036      Exercises   Exercises  Lumbar      Lumbar Exercises: Stretches   Passive Hamstring Stretch  Right;Left;3 reps;30 seconds    Single Knee to Chest Stretch  Right;Left;3 reps;30 seconds    Lower Trunk Rotation  3 reps;30 seconds    Standing Side Bend Limitations  sitting lateral trnk flexion 20-30 sec x 3 each direction propping on forearm     Other Lumbar Stretch Exercise  sidelying book openers 5x10 sec bil    Other Lumbar Stretch Exercise  seated trunk rotation 5x10 sec bil      Lumbar Exercises: Aerobic   Nustep  L5 x 6 min UE (10)   PT present to discuss progress     Lumbar Exercises: Supine   Pelvic Tilt  10 reps;5 seconds    Pelvic Tilt Limitations  improved today with cues for pelvic rocking    Bridge  10 reps;5 seconds  PT Long Term Goals - 10/02/17 1235      PT LONG TERM GOAL #1   Title  independent with HEP    Status  New    Target Date  11/13/17      PT LONG TERM GOAL #2   Title  improve lumbar flexion to </= 25% limited for improved flexibility    Status  New    Target Date  11/13/17      PT LONG TERM GOAL #3   Title  FOTO score improved to </= 44% limitation    Status  New    Target Date  11/13/17      PT LONG TERM GOAL #4   Title  report ability to perform housework and yardwork with pain < 6/10 for improved function    Status  New    Target Date  11/13/17            Plan - 10/11/17 1111    Clinical Impression Statement  Pt did better today with familiar exercises but still needs cues for new exercises due to aphasia.  Pain decreased today per his report.  Slowly progressing with PT.    Rehab Potential  Good    PT Frequency  2x / week    PT Duration  6 weeks    PT Treatment/Interventions  ADLs/Self Care Home Management;Cryotherapy;Electrical Stimulation;Moist Heat;Traction;Therapeutic  exercise;Therapeutic activities;Functional mobility training;Stair training;Gait training;Ultrasound;Neuromuscular re-education;Patient/family education;Manual techniques;Dry needling;Passive range of motion    PT Next Visit Plan  review HEP; progress mobility and core/hip strengthening, general lumbar flexibility, manual/modalities PRN    PT Home Exercise Plan  Access Code: [email or web address] : Access Code: [email or web address]     Consulted and Agree with Plan of Care  Patient    Family Member Consulted  wife       Patient will benefit from skilled therapeutic intervention in order to improve the following deficits and impairments:  Hypomobility, Pain, Increased fascial restricitons, Increased muscle spasms, Decreased strength, Decreased mobility, Decreased range of motion, Impaired flexibility, Postural dysfunction  Visit Diagnosis: Chronic midline low back pain without sciatica  Abnormal posture     Problem List Patient Active Problem List   Diagnosis Date Noted  . Hemiparesis affecting right side as late effect of stroke ([place]) 02/15/2017  . Expressive aphasia 02/15/2017  . Erectile dysfunction 04/25/2016  . Hypothyroidism 04/25/2016  . Gastroesophageal reflux disease 04/25/2016  . Hyperlipidemia 11/03/2014  . Atrial fibrillation ([place]) 04/23/2012  . Long term current use of anticoagulant therapy 04/23/2012  . History of CVA (cerebrovascular accident) 04/23/2012  . Allergic rhinitis 12/14/2010  . Essential hypertension 07/05/2008      Laureen Abrahams, PT, DPT 10/11/17 11:12 AM    [facility] [place], [place], [address] Phone: [phone]   Fax:  [phone]  Name: Eric Ross MRN: [id number] Date of Birth: [birth year]

## 2017-10-16 ENCOUNTER — Ambulatory Visit (INDEPENDENT_AMBULATORY_CARE_PROVIDER_SITE_OTHER): Payer: Medicare Other | Admitting: Rehabilitative and Restorative Service Providers"

## 2017-10-16 ENCOUNTER — Encounter: Payer: Self-pay | Admitting: Rehabilitative and Restorative Service Providers"

## 2017-10-16 DIAGNOSIS — G8929 Other chronic pain: Secondary | ICD-10-CM

## 2017-10-16 DIAGNOSIS — M545 Low back pain: Secondary | ICD-10-CM | POA: Diagnosis not present

## 2017-10-16 DIAGNOSIS — R293 Abnormal posture: Secondary | ICD-10-CM

## 2017-10-16 NOTE — Patient Instructions (Signed)

## 2017-10-16 NOTE — Therapy (Signed)
[place], [place], [address] Phone: [phone]   Fax:  [phone]  Physical Therapy Treatment  Patient Details  Name: Eric Ross MRN: [id number] Date of Birth: Dec 20, 1955 Referring Provider: Frederik Pear, MD   Encounter Date: 10/16/2017  PT End of Session - 10/16/17 1433    Visit Number  5    Number of Visits  12    Date for PT Re-Evaluation  11/13/17    Authorization Type  Medicare    PT Start Time  7564    PT Stop Time  3329    PT Time Calculation (min)  43 min    Activity Tolerance  Patient tolerated treatment well       Past Medical History:  Diagnosis Date  . Atrial fibrillation ([place])    Dr. Gwenlyn Found  . Cardiomyopathy    nonischemic  . Erectile dysfunction   . History of thyroid cancer   . Hyperlipidemia   . Hypothyroidism   . Pseudogout   . Stroke [facility]) 2003    Past Surgical History:  Procedure Laterality Date  . CARDIAC CATHETERIZATION  09/22/01   NORMAL CORONARY ARTERIES  . JOINT REPLACEMENT     BILATERAL KNEE   . LEXISCAN MYOCARDIAL PERFUSION STUDY  05/30/11   NORMAL MYOCARDIAL PERFUSION STUDY EF% 49%.  . THYROIDECTOMY    . TRANSTHORACIC ECHOCARDIOGRAM  05/30/11   EF%=45-50%. NORMAL LV WALL THICKNESS. RV IS MILDLY DILATED. NO SIGNIFICANT VALVE DISEASE.     There were no vitals filed for this visit.  Subjective Assessment - 10/16/17 1435    Subjective  Wife ordered AFO. Pt has continued pain in the LB as well as pain in the Lt wrist and knee.     Currently in Pain?  Yes    Pain Score  6     Pain Location  Back    Pain Orientation  Lower;Mid    Pain Descriptors / Indicators  Sore    Pain Type  Chronic pain    Pain Onset  More than a month ago    Pain Frequency  Constant                       [facility] Adult PT Treatment/Exercise - 10/16/17 0001      Lumbar Exercises: Stretches   Passive Hamstring Stretch  Right;Left;3 reps;30 seconds    Single  Knee to Chest Stretch  Right;Left;3 reps;30 seconds    Lower Trunk Rotation  3 reps;30 seconds    Standing Side Bend Limitations  sitting lateral trnk flexion 20-30 sec x 3 each direction propping on forearm     Other Lumbar Stretch Exercise  seated trunk rotation 5x10 sec bilat      Lumbar Exercises: Aerobic   Recumbent Bike  L3 x 6 min (55-65 rpm target)      Lumbar Exercises: Standing   Wall Slides  10 reps;5 seconds   back aganst large therapy ball    Other Standing Lumbar Exercises  step up forward Rt up; Lt down x 20    Other Standing Lumbar Exercises  lateral step up Rt x 10 (PT providing protraction of Rt hip to avoid retraction of Rt hip and shortening through the side with Rt lateral step up       Lumbar Exercises: Supine   Clam  10 reps;2 seconds   green TB holding one leg still moving opposite    Bridge  15 reps;5 seconds  Engineering geologist Action  TENS    Electrical Stimulation Parameters  to tolerance    Electrical Stimulation Goals  Pain;Tone             PT Education - 10/16/17 1513    Education Details  TENS     Person(s) Educated  Patient;Spouse    Methods  Explanation;Demonstration;Handout    Comprehension  Verbalized understanding          PT Long Term Goals - 10/02/17 1235      PT LONG TERM GOAL #1   Title  independent with HEP    Status  New    Target Date  11/13/17      PT LONG TERM GOAL #2   Title  improve lumbar flexion to </= 25% limited for improved flexibility    Status  New    Target Date  11/13/17      PT LONG TERM GOAL #3   Title  FOTO score improved to </= 44% limitation    Status  New    Target Date  11/13/17      PT LONG TERM GOAL #4   Title  report ability to perform housework and yardwork with pain < 6/10 for improved function    Status  New    Target Date  11/13/17            Plan - 10/16/17 1434    Clinical  Impression Statement  Patient continues to work well in therapy with stretching and strengthening exercises. Back pain persists but pain level is decreased on 0/10 scale Trial of TENS unit in clinic with patient and wife instructed in pain management purpose for TENS unit     Rehab Potential  Good    PT Frequency  2x / week    PT Duration  6 weeks    PT Treatment/Interventions  ADLs/Self Care Home Management;Cryotherapy;Electrical Stimulation;Moist Heat;Traction;Therapeutic exercise;Therapeutic activities;Functional mobility training;Stair training;Gait training;Ultrasound;Neuromuscular re-education;Patient/family education;Manual techniques;Dry needling;Passive range of motion    PT Next Visit Plan  review HEP; progress mobility and core/hip strengthening, general lumbar flexibility, manual/modalities PRN    PT Home Exercise Plan  Access Code: [email or web address] : Access Code: [email or web address]     Consulted and Agree with Plan of Care  Patient    Family Member Consulted  wife       Patient will benefit from skilled therapeutic intervention in order to improve the following deficits and impairments:  Hypomobility, Pain, Increased fascial restricitons, Increased muscle spasms, Decreased strength, Decreased mobility, Decreased range of motion, Impaired flexibility, Postural dysfunction  Visit Diagnosis: Chronic midline low back pain without sciatica  Abnormal posture     Problem List Patient Active Problem List   Diagnosis Date Noted  . Hemiparesis affecting right side as late effect of stroke ([place]) 02/15/2017  . Expressive aphasia 02/15/2017  . Erectile dysfunction 04/25/2016  . Hypothyroidism 04/25/2016  . Gastroesophageal reflux disease 04/25/2016  . Hyperlipidemia 11/03/2014  . Atrial fibrillation ([place]) 04/23/2012  . Long term current use of anticoagulant therapy 04/23/2012  . History of CVA (cerebrovascular accident) 04/23/2012  . Allergic rhinitis 12/14/2010  . Essential hypertension 07/05/2008     Perry Molla Nilda Simmer PT, MPH  10/16/2017, 5:05 PM  [facility] [place], [place], [address] Phone: [phone]   Fax:  [phone]  Name: Eric Ross MRN: [id number] Date of Birth: 11-22-1955

## 2017-10-17 ENCOUNTER — Telehealth: Payer: Self-pay | Admitting: Family Medicine

## 2017-10-17 MED ORDER — CARVEDILOL 12.5 MG PO TABS: 12.5000 mg | ORAL_TABLET | Freq: Two times a day (BID) | ORAL | 3 refills | Status: DC

## 2017-10-17 NOTE — Telephone Encounter (Signed)
[facility] req Carvedil[facility] 12.5 [facility]

## 2017-10-18 ENCOUNTER — Encounter: Payer: Self-pay | Admitting: Rehabilitative and Restorative Service Providers"

## 2017-10-18 ENCOUNTER — Telehealth: Payer: Self-pay | Admitting: Family Medicine

## 2017-10-18 ENCOUNTER — Ambulatory Visit (INDEPENDENT_AMBULATORY_CARE_PROVIDER_SITE_OTHER): Payer: Medicare Other | Admitting: Rehabilitative and Restorative Service Providers"

## 2017-10-18 ENCOUNTER — Other Ambulatory Visit: Payer: Self-pay

## 2017-10-18 DIAGNOSIS — M545 Low back pain, unspecified: Secondary | ICD-10-CM

## 2017-10-18 DIAGNOSIS — R293 Abnormal posture: Secondary | ICD-10-CM

## 2017-10-18 DIAGNOSIS — G8929 Other chronic pain: Secondary | ICD-10-CM

## 2017-10-18 MED ORDER — CARVEDILOL 12.5 MG PO TABS: 12.5000 mg | ORAL_TABLET | Freq: Two times a day (BID) | ORAL | 3 refills | Status: DC

## 2017-10-18 NOTE — Therapy (Signed)
[place], [place], [address] Phone: [phone]   Fax:  [phone]  Physical Therapy Treatment  Patient Details  Name: Eric Ross MRN: [id number] Date of Birth: [birth year] Referring Provider: Frederik Pear, MD   Encounter Date: 10/18/2017  PT End of Session - 10/18/17 1333    Visit Number  6    Number of Visits  12    Date for PT Re-Evaluation  11/13/17    Authorization Type  Medicare    PT Start Time  1330    PT Stop Time  1400    PT Time Calculation (min)  30 min    Activity Tolerance  Patient tolerated treatment well       Past Medical History:  Diagnosis Date  . Atrial fibrillation ([place])    Dr. Gwenlyn Found  . Cardiomyopathy    nonischemic  . Erectile dysfunction   . History of thyroid cancer   . Hyperlipidemia   . Hypothyroidism   . Pseudogout   . Stroke [facility]) 2003    Past Surgical History:  Procedure Laterality Date  . CARDIAC CATHETERIZATION  09/22/01   NORMAL CORONARY ARTERIES  . JOINT REPLACEMENT     BILATERAL KNEE   . LEXISCAN MYOCARDIAL PERFUSION STUDY  05/30/11   NORMAL MYOCARDIAL PERFUSION STUDY EF% 49%.  . THYROIDECTOMY    . TRANSTHORACIC ECHOCARDIOGRAM  05/30/11   EF%=45-50%. NORMAL LV WALL THICKNESS. RV IS MILDLY DILATED. NO SIGNIFICANT VALVE DISEASE.     There were no vitals filed for this visit.  Subjective Assessment - 10/18/17 1335    Subjective  Patient reports that his wofe ordered the TENS unitl He has been mowing today. Pain continues in the lower back.     Currently in Pain?  Yes    Pain Score  7     Pain Location  Back    Pain Orientation  Lower;Mid    Pain Descriptors / Indicators  Sore    Pain Type  Chronic pain                       [facility] Adult PT Treatment/Exercise - 10/18/17 0001      Lumbar Exercises: Stretches   Passive Hamstring Stretch  Right;Left;3 reps;30 seconds    Single Knee to Chest Stretch  Right;Left;3 reps;30  seconds    Double Knee to Chest Stretch  3 reps;30 seconds    Lower Trunk Rotation  3 reps;30 seconds    Standing Side Bend Limitations  sitting lateral trnk flexion 20-30 sec x 3 each direction propping on forearm     Other Lumbar Stretch Exercise  seated trunk rotation 5x10 sec bilat      Lumbar Exercises: Aerobic   Nustep  5 x 6 min UE (11)      Lumbar Exercises: Standing   Wall Slides  10 reps;5 seconds   back aganst large therapy ball      Lumbar Exercises: Supine   Clam  10 reps;5 seconds   green TB holding one leg still moving opposite    Bridge  15 reps;5 seconds    Other Supine Lumbar Exercises  rolling green therapeutic ball up and out into hip flexion/knee flexion x 20                   PT Long Term Goals - 10/02/17 1235      PT LONG TERM GOAL #1   Title  independent with  HEP    Status  New    Target Date  11/13/17      PT LONG TERM GOAL #2   Title  improve lumbar flexion to </= 25% limited for improved flexibility    Status  New    Target Date  11/13/17      PT LONG TERM GOAL #3   Title  FOTO score improved to </= 44% limitation    Status  New    Target Date  11/13/17      PT LONG TERM GOAL #4   Title  report ability to perform housework and yardwork with pain < 6/10 for improved function    Status  New    Target Date  11/13/17            Plan - 10/18/17 1334    Clinical Impression Statement  Continued pain with functional activities and at rest. Patient works well in therapy and reports compliance with HEP>     Rehab Potential  Good    PT Frequency  2x / week    PT Duration  6 weeks    PT Treatment/Interventions  ADLs/Self Care Home Management;Cryotherapy;Electrical Stimulation;Moist Heat;Traction;Therapeutic exercise;Therapeutic activities;Functional mobility training;Stair training;Gait training;Ultrasound;Neuromuscular re-education;Patient/family education;Manual techniques;Dry needling;Passive range of motion    PT Next Visit Plan   review HEP; progress mobility and core/hip strengthening, general lumbar flexibility, manual/modalities PRN    PT Home Exercise Plan  Access Code: [email or web address] : Access Code: [email or web address]     Consulted and Agree with Plan of Care  Patient    Family Member Consulted  wife       Patient will benefit from skilled therapeutic intervention in order to improve the following deficits and impairments:  Hypomobility, Pain, Increased fascial restricitons, Increased muscle spasms, Decreased strength, Decreased mobility, Decreased range of motion, Impaired flexibility, Postural dysfunction  Visit Diagnosis: Chronic midline low back pain without sciatica  Abnormal posture     Problem List Patient Active Problem List   Diagnosis Date Noted  . Hemiparesis affecting right side as late effect of stroke ([place]) 02/15/2017  . Expressive aphasia 02/15/2017  . Erectile dysfunction 04/25/2016  . Hypothyroidism 04/25/2016  . Gastroesophageal reflux disease 04/25/2016  . Hyperlipidemia 11/03/2014  . Atrial fibrillation ([place]) 04/23/2012  . Long term current use of anticoagulant therapy 04/23/2012  . History of CVA (cerebrovascular accident) 04/23/2012  . Allergic rhinitis 12/14/2010  . Essential hypertension 07/05/2008    [name] Nilda Simmer PT, MPH  10/18/2017, 1:58 PM  [facility] [place], [place], [address] Phone: [phone]   Fax:  [phone]  Name: Eric Ross MRN: [id number] Date of Birth: 06-30-1955

## 2017-10-18 NOTE — Telephone Encounter (Signed)
Done KH 

## 2017-10-18 NOTE — Telephone Encounter (Signed)
   Fax refill request from [facility]  Request to obtain new RX for  Carvedilol 12.5 mg  Qty # 180

## 2017-10-21 ENCOUNTER — Encounter: Payer: Medicare Other | Admitting: Physical Therapy

## 2017-10-21 ENCOUNTER — Other Ambulatory Visit: Payer: Self-pay | Admitting: Pharmacist

## 2017-10-21 ENCOUNTER — Telehealth: Payer: Self-pay | Admitting: Physical Therapy

## 2017-10-21 MED ORDER — WARFARIN SODIUM 5 MG PO TABS
ORAL_TABLET | ORAL | 0 refills | Status: DC
Start: 2017-10-21 — End: 2018-03-27

## 2017-10-21 NOTE — Telephone Encounter (Signed)
Called pt's wife due to missed appointment for PT.  Left message to call office.  Laureen Abrahams, PT, DPT 10/21/17 12:38 PM

## 2017-10-22 ENCOUNTER — Ambulatory Visit (INDEPENDENT_AMBULATORY_CARE_PROVIDER_SITE_OTHER): Payer: Medicare Other | Admitting: Rehabilitative and Restorative Service Providers"

## 2017-10-22 ENCOUNTER — Other Ambulatory Visit: Payer: Self-pay | Admitting: Cardiovascular Disease

## 2017-10-22 ENCOUNTER — Encounter: Payer: Self-pay | Admitting: Rehabilitative and Restorative Service Providers"

## 2017-10-22 DIAGNOSIS — G8929 Other chronic pain: Secondary | ICD-10-CM

## 2017-10-22 DIAGNOSIS — M545 Low back pain: Secondary | ICD-10-CM

## 2017-10-22 DIAGNOSIS — Z7901 Long term (current) use of anticoagulants: Secondary | ICD-10-CM | POA: Diagnosis not present

## 2017-10-22 DIAGNOSIS — R293 Abnormal posture: Secondary | ICD-10-CM

## 2017-10-22 DIAGNOSIS — I482 Chronic atrial fibrillation: Secondary | ICD-10-CM | POA: Diagnosis not present

## 2017-10-22 NOTE — Therapy (Addendum)
[facility] [place], [place], [address] Phone: [phone]   Fax:  [phone]  Physical Therapy Treatment/Discharge  Patient Details  Name: Eric Ross MRN: [id number] Date of Birth: [birth year] Referring Provider: Frederik Pear, MD   Encounter Date: 10/22/2017  PT End of Session - 10/22/17 1120    Visit Number  7    Number of Visits  12    Date for PT Re-Evaluation  11/13/17    Authorization Type  Medicare    PT Start Time  1120    PT Stop Time  1208    PT Time Calculation (min)  48 min    Activity Tolerance  Patient tolerated treatment well       Past Medical History:  Diagnosis Date  . Atrial fibrillation ([place])    Dr. Gwenlyn Found  . Cardiomyopathy    nonischemic  . Erectile dysfunction   . History of thyroid cancer   . Hyperlipidemia   . Hypothyroidism   . Pseudogout   . Stroke [facility]) 2003    Past Surgical History:  Procedure Laterality Date  . CARDIAC CATHETERIZATION  09/22/01   NORMAL CORONARY ARTERIES  . JOINT REPLACEMENT     BILATERAL KNEE   . LEXISCAN MYOCARDIAL PERFUSION STUDY  05/30/11   NORMAL MYOCARDIAL PERFUSION STUDY EF% 49%.  . THYROIDECTOMY    . TRANSTHORACIC ECHOCARDIOGRAM  05/30/11   EF%=45-50%. NORMAL LV WALL THICKNESS. RV IS MILDLY DILATED. NO SIGNIFICANT VALVE DISEASE.     There were no vitals filed for this visit.  Subjective Assessment - 10/22/17 1124    Subjective  Was mowing yesterday and that always makes his back hurt. He did get a TENS unit but has not tried using the unit at home. Eric Ross reports that his back feels more flexible but the pain is unchanged. He feels the therapy is helpful to increase the flexibility in the back.     Pertinent History  CVA 2003 (expressive aphasia (mild receptive aphasia), Rt sided weakness UE more involved than LE), arthritis, afib, cardiomyopathy    Limitations  Sitting;Lifting;Walking    How long can you sit comfortably?  1 hour     How long can you walk comfortably?  20-30 min    Diagnostic tests  xrays: arthritis    Patient Stated Goals  improve pain    Currently in Pain?  Yes    Pain Score  7     Pain Location  Back    Pain Orientation  Lower;Mid    Pain Descriptors / Indicators  Sore    Pain Type  Chronic pain    Pain Onset  More than a month ago    Pain Frequency  Constant    Aggravating Factors   sitting, lifting; mowing the lawn; riding; walking     Pain Relieving Factors  pain patches; repositioning          [facility] PT Assessment - 10/22/17 0001      Assessment   Medical Diagnosis  LBP, tight adductors    Referring Provider  Frederik Pear, MD    Onset Date/Surgical Date  --   >30 years    Next MD Visit  10/23/17    Prior Therapy  none      Posture/Postural Control   Posture/Postural Control  Postural limitations    Postural Limitations  Decreased lumbar lordosis      AROM   Overall AROM Comments  thoracolumbar: flexion limited ~25%, all other motions  limited 25% with no change in pain      Strength   Strength Assessment Site  Hip;Knee;Ankle    Right/Left Hip  Right;Left    Right Hip Flexion  4/5    Right Hip Extension  4-/5    Right Hip ABduction  4/5    Left Hip Flexion  5/5    Left Hip Extension  4-/5    Left Hip ABduction  5/5    Right/Left Knee  Right;Left    Right Knee Flexion  4/5    Right Knee Extension  5/5    Left Knee Flexion  5/5    Left Knee Extension  5/5    Right/Left Ankle  Right;Left    Right Ankle Dorsiflexion  4+/5    Left Ankle Dorsiflexion  5/5      Flexibility   Hamstrings  tightness Rt>Lt    Piriformis  tightness Rt>Lt      Palpation   Palpation comment  point tenderness along L3-5 spinous processes; hypomobility lumbar spine                   [facility] Adult PT Treatment/Exercise - 10/22/17 0001      Lumbar Exercises: Stretches   Passive Hamstring Stretch  Right;Left;3 reps;30 seconds    Single Knee to Chest Stretch  Right;Left;3 reps;30 seconds     Double Knee to Chest Stretch  3 reps;30 seconds    Lower Trunk Rotation  3 reps;30 seconds    Standing Side Bend Limitations  sitting lateral trnk flexion 20 sec x 3 each direction propping on forearm     Other Lumbar Stretch Exercise  seated trunk rotation 3x10 sec bilat      Lumbar Exercises: Aerobic   Nustep  5 x 6 min UE (11)      Lumbar Exercises: Standing   Wall Slides  10 reps;5 seconds   back aganst large therapy ball    Other Standing Lumbar Exercises  stretching over large green therapy ball on table - rolling forward to stretch lumbothoracic spine 5-10 sec x 5       Lumbar Exercises: Supine   Clam  10 reps;5 seconds   green TB holding one leg still moving opposite    Bridge  15 reps;5 seconds                  PT Long Term Goals - 10/22/17 1147      PT LONG TERM GOAL #1   Title  independent with HEP    Status  On-going      PT LONG TERM GOAL #2   Title  improve lumbar flexion to </= 25% limited for improved flexibility    Status  Achieved      PT LONG TERM GOAL #3   Title  FOTO score improved to </= 44% limitation    Status  On-going      PT LONG TERM GOAL #4   Title  report ability to perform housework and yardwork with pain < 6/10 for improved function    Status  On-going            Plan - 10/22/17 1121    Clinical Impression Statement  Patient reports no significant change in the back pain but does note improvement in flexibility through his back. Eric Ross demonstrates increased thoracolumbar ROM as well as increase in strength in bilat hips. He continues to participate in the functional activities he has been accustomed to including mowing and activities  around his house. Eric Ross reports that he is working on exercises at home some but not on a daily basis.     Rehab Potential  Good    PT Frequency  2x / week    PT Duration  6 weeks    PT Treatment/Interventions  ADLs/Self Care Home Management;Cryotherapy;Electrical Stimulation;Moist  Heat;Traction;Therapeutic exercise;Therapeutic activities;Functional mobility training;Stair training;Gait training;Ultrasound;Neuromuscular re-education;Patient/family education;Manual techniques;Dry needling;Passive range of motion    PT Next Visit Plan  review HEP; progress mobility and core/hip strengthening, general lumbar flexibility, manual/modalities PRN; Note to MD for RTD 10/23/17    PT Home Exercise Plan  Access Code: [email or web address] : Access Code: [email or web address]     Consulted and Agree with Plan of Care  Patient    Family Member Consulted  wife       Patient will benefit from skilled therapeutic intervention in order to improve the following deficits and impairments:  Hypomobility, Pain, Increased fascial restricitons, Increased muscle spasms, Decreased strength, Decreased mobility, Decreased range of motion, Impaired flexibility, Postural dysfunction  Visit Diagnosis: Chronic midline low back pain without sciatica  Abnormal posture     Problem List Patient Active Problem List   Diagnosis Date Noted  . Hemiparesis affecting right side as late effect of stroke ([place]) 02/15/2017  . Expressive aphasia 02/15/2017  . Erectile dysfunction 04/25/2016  . Hypothyroidism 04/25/2016  . Gastroesophageal reflux disease 04/25/2016  . Hyperlipidemia 11/03/2014  . Atrial fibrillation ([place]) 04/23/2012  . Long term current use of anticoagulant therapy 04/23/2012  . History of CVA (cerebrovascular accident) 04/23/2012  . Allergic rhinitis 12/14/2010  . Essential hypertension 07/05/2008    [name] PT, MPH  10/22/2017, 12:09 PM  [facility] [place], [place], [address] Phone: [phone]   Fax:  [phone]  Name: Eric Ross MRN: [id number] Date of Birth: [birth year]      PHYSICAL THERAPY DISCHARGE SUMMARY  Visits from Start of Care: 7  Current functional level related to goals / functional outcomes: See  above   Remaining deficits: See above   Education / Equipment: HEP  Plan: Patient agrees to discharge.  Patient goals were partially met. Patient is being discharged due to the patient's request.  ?????      Laureen Abrahams, PT, DPT 11/04/17 12:12 PM  [facility] [place] [place], [place] [address]  [phone] (office) [phone] (fax)

## 2017-10-23 ENCOUNTER — Ambulatory Visit (INDEPENDENT_AMBULATORY_CARE_PROVIDER_SITE_OTHER): Payer: Medicare Other | Admitting: Pharmacist

## 2017-10-23 DIAGNOSIS — I482 Chronic atrial fibrillation, unspecified: Secondary | ICD-10-CM

## 2017-10-23 DIAGNOSIS — M79642 Pain in left hand: Secondary | ICD-10-CM | POA: Diagnosis not present

## 2017-10-23 DIAGNOSIS — Z7901 Long term (current) use of anticoagulants: Secondary | ICD-10-CM

## 2017-10-23 DIAGNOSIS — Z8673 Personal history of transient ischemic attack (TIA), and cerebral infarction without residual deficits: Secondary | ICD-10-CM

## 2017-10-23 DIAGNOSIS — M545 Low back pain: Secondary | ICD-10-CM | POA: Diagnosis not present

## 2017-10-23 DIAGNOSIS — M7052 Other bursitis of knee, left knee: Secondary | ICD-10-CM | POA: Diagnosis not present

## 2017-10-23 LAB — PROTIME-INR
INR: 4.9 — AB
PROTHROMBIN TIME: 51.5 s — AB (ref 9.0–11.5)

## 2017-10-25 ENCOUNTER — Encounter: Payer: Medicare Other | Admitting: Rehabilitative and Restorative Service Providers"

## 2017-10-30 ENCOUNTER — Other Ambulatory Visit: Payer: Self-pay | Admitting: Cardiovascular Disease

## 2017-10-30 DIAGNOSIS — M545 Low back pain: Secondary | ICD-10-CM | POA: Diagnosis not present

## 2017-10-30 DIAGNOSIS — Z7901 Long term (current) use of anticoagulants: Secondary | ICD-10-CM | POA: Diagnosis not present

## 2017-10-30 LAB — PROTIME-INR: INR: 1.8 — AB (ref 0.9–1.1)

## 2017-10-31 ENCOUNTER — Ambulatory Visit (INDEPENDENT_AMBULATORY_CARE_PROVIDER_SITE_OTHER): Payer: Medicare Other | Admitting: Pharmacist Clinician (PhC)/ Clinical Pharmacy Specialist

## 2017-10-31 DIAGNOSIS — Z8673 Personal history of transient ischemic attack (TIA), and cerebral infarction without residual deficits: Secondary | ICD-10-CM

## 2017-10-31 DIAGNOSIS — I4891 Unspecified atrial fibrillation: Secondary | ICD-10-CM

## 2017-10-31 DIAGNOSIS — Z7901 Long term (current) use of anticoagulants: Secondary | ICD-10-CM

## 2017-10-31 LAB — PROTIME-INR
INR: 1.8 — AB
PROTHROMBIN TIME: 19.2 s — AB (ref 9.0–11.5)

## 2017-11-05 DIAGNOSIS — M79642 Pain in left hand: Secondary | ICD-10-CM | POA: Diagnosis not present

## 2017-11-05 DIAGNOSIS — M545 Low back pain: Secondary | ICD-10-CM | POA: Diagnosis not present

## 2017-11-11 DIAGNOSIS — M47816 Spondylosis without myelopathy or radiculopathy, lumbar region: Secondary | ICD-10-CM | POA: Diagnosis not present

## 2017-11-13 DIAGNOSIS — M47816 Spondylosis without myelopathy or radiculopathy, lumbar region: Secondary | ICD-10-CM | POA: Diagnosis not present

## 2017-11-21 ENCOUNTER — Encounter: Payer: Self-pay | Admitting: Cardiovascular Disease

## 2017-11-21 ENCOUNTER — Other Ambulatory Visit: Payer: Self-pay | Admitting: Cardiovascular Disease

## 2017-11-21 DIAGNOSIS — Z7901 Long term (current) use of anticoagulants: Secondary | ICD-10-CM | POA: Diagnosis not present

## 2017-11-21 DIAGNOSIS — I482 Chronic atrial fibrillation, unspecified: Secondary | ICD-10-CM | POA: Diagnosis not present

## 2017-11-21 LAB — PROTIME-INR: INR: 2.4 — AB (ref 0.9–1.1)

## 2017-11-22 LAB — PROTIME-INR
INR: 2.4 — AB
PROTHROMBIN TIME: 23.4 s — AB (ref 9.0–11.5)

## 2017-11-25 ENCOUNTER — Ambulatory Visit (INDEPENDENT_AMBULATORY_CARE_PROVIDER_SITE_OTHER): Payer: Medicare Other | Admitting: Pharmacist

## 2017-11-25 DIAGNOSIS — Z7901 Long term (current) use of anticoagulants: Secondary | ICD-10-CM | POA: Diagnosis not present

## 2017-11-25 DIAGNOSIS — I4891 Unspecified atrial fibrillation: Secondary | ICD-10-CM

## 2017-11-25 DIAGNOSIS — Z8673 Personal history of transient ischemic attack (TIA), and cerebral infarction without residual deficits: Secondary | ICD-10-CM

## 2017-12-06 DIAGNOSIS — M5136 Other intervertebral disc degeneration, lumbar region: Secondary | ICD-10-CM | POA: Diagnosis not present

## 2017-12-25 ENCOUNTER — Encounter: Payer: Self-pay | Admitting: Cardiovascular Disease

## 2017-12-25 ENCOUNTER — Ambulatory Visit (INDEPENDENT_AMBULATORY_CARE_PROVIDER_SITE_OTHER): Payer: Medicare Other | Admitting: Cardiovascular Disease

## 2017-12-25 VITALS — BP 118/72 | HR 108 | Ht 74.0 in | Wt 184.0 lb

## 2017-12-25 DIAGNOSIS — R0602 Shortness of breath: Secondary | ICD-10-CM | POA: Diagnosis not present

## 2017-12-25 DIAGNOSIS — R42 Dizziness and giddiness: Secondary | ICD-10-CM | POA: Diagnosis not present

## 2017-12-25 DIAGNOSIS — R072 Precordial pain: Secondary | ICD-10-CM | POA: Diagnosis not present

## 2017-12-25 NOTE — Assessment & Plan Note (Signed)
History of hyperlipidemia on statin therapy with lipid profile performed 02/15/2017 revealing total cholesterol of 56, LDL of 83 and HDL 45.

## 2017-12-25 NOTE — Patient Instructions (Signed)
Medication Instructions:  NO CHANGE If you need a refill on your cardiac medications before your next appointment, please call your pharmacy.   Lab work: If you have labs (blood work) drawn today and your tests are completely normal, you will receive your results only by: [name] MyChart Message (if you have MyChart) OR . A paper copy in the mail If you have any lab test that is abnormal or we need to change your treatment, we will call you to review the results.  Testing/Procedures: Your physician has requested that you have an echocardiogram. Echocardiography is a painless test that uses sound waves to create images of your heart. It provides your doctor with information about the size and shape of your heart and how well your heart's chambers and valves are working. This procedure takes approximately one hour. There are no restrictions for this procedure.   Your physician has recommended that you wear a 30 DAY event monitor. Event monitors are medical devices that record the heart's electrical activity. Doctors most often [place] these monitors to diagnose arrhythmias. Arrhythmias are problems with the speed or rhythm of the heartbeat. The monitor is a small, portable device. You can wear one while you do your normal daily activities. This is usually used to diagnose what is causing palpitations/syncope (passing out).  Your physician has requested that you have a lexiscan myoview. For further information please visit [email or web address].tn. Please follow instruction sheet, as given.   Follow-Up: At [facility], you and your health needs are our priority.  As part of our continuing mission to provide you with exceptional heart care, we have created designated Provider Care Teams.  These Care Teams include your primary Cardiologist (physician) and Advanced Practice Providers (APPs -  Physician Assistants and Nurse Practitioners) who all work together to provide you with the care you need, when you need  it. You will need a follow up appointment in 6 months.  Please call our office 2 months in advance to schedule this appointment.  You may see Quay Burow MD or one of the following Advanced Practice Providers on your designated Care Team:   Kerin Ransom, PA-C Roby Lofts, [place] . Sande Rives, PA-C

## 2017-12-25 NOTE — Assessment & Plan Note (Signed)
History of chronic A. fib rate controlled on Coumadin anticoagulation. 

## 2017-12-25 NOTE — Assessment & Plan Note (Signed)
History of essential hypertension blood pressure measured today 118/72.  He is on carvedilol and enalapril.  Continue current meds at current dosing.
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
## 2017-12-30 ENCOUNTER — Other Ambulatory Visit: Payer: Self-pay | Admitting: Cardiovascular Disease

## 2017-12-31 ENCOUNTER — Telehealth: Payer: Self-pay

## 2017-12-31 NOTE — Telephone Encounter (Signed)
Called pt due to overdue inr pt spouse answered and stated the pt will have inr done tomorrow

## 2018-01-01 ENCOUNTER — Other Ambulatory Visit: Payer: Self-pay | Admitting: Cardiovascular Disease

## 2018-01-01 DIAGNOSIS — I482 Chronic atrial fibrillation, unspecified: Secondary | ICD-10-CM | POA: Diagnosis not present

## 2018-01-01 DIAGNOSIS — Z7901 Long term (current) use of anticoagulants: Secondary | ICD-10-CM | POA: Diagnosis not present

## 2018-01-02 LAB — PROTIME-INR
INR: 3.8 — AB
INR: 3.8 — AB (ref 0.9–1.1)
Prothrombin Time: 35.9 s — ABNORMAL HIGH (ref 9.0–11.5)

## 2018-01-03 ENCOUNTER — Ambulatory Visit (INDEPENDENT_AMBULATORY_CARE_PROVIDER_SITE_OTHER): Payer: Medicare Other | Admitting: Pharmacist Clinician (PhC)/ Clinical Pharmacy Specialist

## 2018-01-03 ENCOUNTER — Telehealth (HOSPITAL_COMMUNITY): Payer: Self-pay

## 2018-01-03 DIAGNOSIS — Z8673 Personal history of transient ischemic attack (TIA), and cerebral infarction without residual deficits: Secondary | ICD-10-CM | POA: Diagnosis not present

## 2018-01-03 DIAGNOSIS — I4891 Unspecified atrial fibrillation: Secondary | ICD-10-CM

## 2018-01-03 DIAGNOSIS — Z7901 Long term (current) use of anticoagulants: Secondary | ICD-10-CM | POA: Diagnosis not present

## 2018-01-03 NOTE — Telephone Encounter (Signed)
Encounter complete. 

## 2018-01-08 ENCOUNTER — Ambulatory Visit (HOSPITAL_COMMUNITY)
Admission: RE | Admit: 2018-01-08 | Discharge: 2018-01-08 | Disposition: A | Discharge status: Home / Self Care | Payer: Medicare Other | Source: Ambulatory Visit | Attending: Cardiovascular Disease | Admitting: Cardiovascular Disease

## 2018-01-08 DIAGNOSIS — R072 Precordial pain: Secondary | ICD-10-CM | POA: Diagnosis not present

## 2018-01-08 LAB — MYOCARDIAL PERFUSION IMAGING
CSEPPHR: 157 {beats}/min
LV dias vol: 115 mL (ref 62–150)
LV sys vol: 75 mL
Rest HR: 86 {beats}/min
SDS: 3
SRS: 2
SSS: 5
TID: 1.05

## 2018-01-08 MED ORDER — TECHNETIUM TC 99M TETROFOSMIN IV KIT
10.4000 | PACK | Freq: Once | INTRAVENOUS | Status: AC | PRN
  Administered 2018-01-08: 10.4 via INTRAVENOUS
  Filled 2018-01-08: qty 11

## 2018-01-08 MED ORDER — TECHNETIUM TC 99M TETROFOSMIN IV KIT
32.9000 | PACK | Freq: Once | INTRAVENOUS | Status: AC | PRN
  Administered 2018-01-08: 32.9 via INTRAVENOUS
  Filled 2018-01-08: qty 33

## 2018-01-08 MED ORDER — REGADENOSON 0.4 MG/5ML IV SOLN
0.4000 mg | Freq: Once | INTRAVENOUS | Status: AC
  Administered 2018-01-08: 0.4 mg via INTRAVENOUS

## 2018-01-13 ENCOUNTER — Other Ambulatory Visit: Payer: Self-pay | Admitting: Family Medicine

## 2018-01-13 DIAGNOSIS — E039 Hypothyroidism, unspecified: Secondary | ICD-10-CM

## 2018-01-15 ENCOUNTER — Other Ambulatory Visit: Payer: Self-pay

## 2018-01-15 ENCOUNTER — Encounter (INDEPENDENT_AMBULATORY_CARE_PROVIDER_SITE_OTHER): Payer: Medicare Other

## 2018-01-15 ENCOUNTER — Ambulatory Visit (HOSPITAL_COMMUNITY): Payer: Medicare Other | Attending: Cardiology

## 2018-01-15 DIAGNOSIS — R0602 Shortness of breath: Secondary | ICD-10-CM

## 2018-01-15 DIAGNOSIS — R42 Dizziness and giddiness: Secondary | ICD-10-CM | POA: Diagnosis not present

## 2018-01-17 ENCOUNTER — Other Ambulatory Visit: Payer: Self-pay | Admitting: Cardiovascular Disease

## 2018-01-17 ENCOUNTER — Telehealth: Payer: Self-pay

## 2018-01-17 DIAGNOSIS — I482 Chronic atrial fibrillation, unspecified: Secondary | ICD-10-CM | POA: Diagnosis not present

## 2018-01-17 DIAGNOSIS — Z7901 Long term (current) use of anticoagulants: Secondary | ICD-10-CM | POA: Diagnosis not present

## 2018-01-17 LAB — PROTIME-INR: INR: 1.4 — AB (ref <=1.1)

## 2018-01-17 NOTE — Telephone Encounter (Addendum)
Received a critical monitor from [employer] reporting  a fib sustained with 11 beat run of Vtach around 6 pm on 12/19. Strip reviewed by Dr. Gwenlyn Found who confirmed report analysis and recommended no further changes at this time and for pt to continue to wear monitor. Attempted to contact pt. Left message to call back.

## 2018-01-18 LAB — PROTIME-INR
INR: 1.4 — ABNORMAL HIGH
Prothrombin Time: 14.5 s — ABNORMAL HIGH (ref 9.0–11.5)

## 2018-01-20 ENCOUNTER — Ambulatory Visit (INDEPENDENT_AMBULATORY_CARE_PROVIDER_SITE_OTHER): Payer: Medicare Other | Admitting: Pharmacist Clinician (PhC)/ Clinical Pharmacy Specialist

## 2018-01-20 DIAGNOSIS — I4891 Unspecified atrial fibrillation: Secondary | ICD-10-CM

## 2018-01-20 DIAGNOSIS — Z8673 Personal history of transient ischemic attack (TIA), and cerebral infarction without residual deficits: Secondary | ICD-10-CM

## 2018-01-20 DIAGNOSIS — Z7901 Long term (current) use of anticoagulants: Secondary | ICD-10-CM | POA: Diagnosis not present

## 2018-01-24 NOTE — Telephone Encounter (Signed)
Follow Up:     Pt's wife returning your call from this morning. She is working, that is why she keep missing your call, she hope the next time she will be available.[name]

## 2018-01-24 NOTE — Telephone Encounter (Signed)
Attempted to contact pt X 2. Left message to call back.

## 2018-01-24 NOTE — Telephone Encounter (Signed)
Wife updated with Dr. Kennon Holter recommendation. Verbalized understanding.

## 2018-01-30 ENCOUNTER — Telehealth: Payer: Self-pay

## 2018-01-30 NOTE — Telephone Encounter (Signed)
Triage-[employer] ECG strips received. Reviewed by Dr.Berry. per Dr.Berry pt is to be scheduled to see him next week. - 01/28/18 @ 10:10pm.Atrial Flutter max HR 160pm (auto-triggered). Duration 1 min. Call to ck on pt. Spoke with pt wife DPR on file. Pt wife sts that the pt is not currently home. She does sts that the pt has been having increased sob. Adv her of Dr.berrys' recommendation. She sts that the pt will need a Wed appt. Appt scheduled with Dr. Gwenlyn Found for 1/8 @ 10am. Strips placed in Dr.Berry's box. Adv that pt call the office sooner if symptoms worsen. Pt wife verbalized understanding and voiced appreciation for the call.

## 2018-02-05 ENCOUNTER — Ambulatory Visit (INDEPENDENT_AMBULATORY_CARE_PROVIDER_SITE_OTHER): Payer: Medicare Other | Admitting: Cardiovascular Disease

## 2018-02-05 ENCOUNTER — Encounter: Payer: Self-pay | Admitting: Cardiovascular Disease

## 2018-02-05 VITALS — BP 110/80 | HR 101 | Ht 74.0 in | Wt 184.0 lb

## 2018-02-05 DIAGNOSIS — I4891 Unspecified atrial fibrillation: Secondary | ICD-10-CM | POA: Diagnosis not present

## 2018-02-05 DIAGNOSIS — I472 Ventricular tachycardia, unspecified: Secondary | ICD-10-CM

## 2018-02-05 DIAGNOSIS — I498 Other specified cardiac arrhythmias: Secondary | ICD-10-CM

## 2018-02-05 DIAGNOSIS — R55 Syncope and collapse: Secondary | ICD-10-CM | POA: Diagnosis not present

## 2018-02-05 DIAGNOSIS — R42 Dizziness and giddiness: Secondary | ICD-10-CM

## 2018-02-05 DIAGNOSIS — I428 Other cardiomyopathies: Secondary | ICD-10-CM | POA: Insufficient documentation

## 2018-02-05 MED ORDER — CARVEDILOL 12.5 MG PO TABS: 18.7500 mg | ORAL_TABLET | Freq: Two times a day (BID) | ORAL | 5 refills | Status: DC

## 2018-02-05 NOTE — Patient Instructions (Addendum)
Medication Instructions:   INCREASE YOUR CARVEDILOL TO 18.75MG  (1 AND A HALF TABLETS) TWICE A DAY If you need a refill on your cardiac medications before your next appointment, please call your pharmacy.   Lab work: NONE If you have labs (blood work) drawn today and your tests are completely normal, you will receive your results only by: [name] MyChart Message (if you have MyChart) OR . A paper copy in the mail If you have any lab test that is abnormal or we need to change your treatment, we will call you to review the results.  Testing/Procedures: NONE  Follow-Up: At [facility], you and your health needs are our priority.  As part of our continuing mission to provide you with exceptional heart care, we have created designated Provider Care Teams.  These Care Teams include your primary Cardiologist (physician) and Advanced Practice Providers (APPs -  Physician Assistants and Nurse Practitioners) who all work together to provide you with the care you need, when you need it. . You will need a follow up appointment in 6 months.  Please call our office 2 months in advance to schedule this appointment.  You may see Dr. Gwenlyn Found or one of the following Advanced Practice Providers on your designated Care Team:   . Kerin Ransom, [place] . Almyra Deforest, PA-C . Fabian Sharp, PA-C . Jory Sims, DNP . Rosaria Ferries, PA-C . Roby Lofts, PA-C . Sande Rives, PA-C  Any Other Special Instructions Will Be Listed Below (If Applicable). REFERRAL TO CARDIAC ELECTROPHYSIOLOGY

## 2018-02-05 NOTE — Assessment & Plan Note (Signed)
History of nonischemic cardia myopathy with cardiac catheterization performed by myself to that 2003 revealing normal coronary arteries and severe LV dysfunction.  His EF is been in the 40 to 45% range which it still has by 2D echo performed 01/15/2018.  Because of some chest pain a Myoview stress test performed 01/08/2018 showed no ischemic abnormalities.

## 2018-02-05 NOTE — Assessment & Plan Note (Signed)
Eric Ross had an event monitor done because of episodes of presyncope.  These episodes occur at any time typically when he standing.  He can be walking in a grocery store and gets suddenly dizzy.  They do not sound orthostatic.  An event monitor showed A. fib with short runs of what looks like nonsustained particular tachycardia versus aberrantly conducted beats.  His heart rate today is 101.  I am going to increase his carvedilol from 12.5 mg twice a day to 18.75 mg twice a day.  His blood pressure is somewhat soft.  I am referring him to EP for further evaluation.

## 2018-02-05 NOTE — Progress Notes (Signed)
Mr. Power returns today for follow-up of his outpatient test.  A 2D echo revealed an EF of 45%, Myoview showed no ischemic abnormalities.  An event monitor showed chronic A. fib with short bursts of nonsustained ventricular tachycardia versus aberrantly conducted beats.  Because of his episodes of presyncope I am going to refer him to EP for further evaluation.  His heart rate is mildly elevated and because of this I am going to slightly uptitrate his carvedilol.  I will see him back in 6 months for follow-up.  Lorretta Harp, M.D., [place], [facility], [place] [address] [place], [place]  [address]  [phone] 02/05/2018 11:19 AM

## 2018-02-07 ENCOUNTER — Other Ambulatory Visit: Payer: Self-pay | Admitting: Cardiovascular Disease

## 2018-02-07 DIAGNOSIS — I482 Chronic atrial fibrillation, unspecified: Secondary | ICD-10-CM | POA: Diagnosis not present

## 2018-02-07 DIAGNOSIS — Z7901 Long term (current) use of anticoagulants: Secondary | ICD-10-CM | POA: Diagnosis not present

## 2018-02-07 LAB — POCT INR: INR: 1.2 — AB (ref 2.0–3.0)

## 2018-02-08 LAB — PROTIME-INR
INR: 1.2 — ABNORMAL HIGH
Prothrombin Time: 12.6 s — ABNORMAL HIGH (ref 9.0–11.5)

## 2018-02-10 ENCOUNTER — Telehealth: Payer: Self-pay

## 2018-02-10 ENCOUNTER — Ambulatory Visit (INDEPENDENT_AMBULATORY_CARE_PROVIDER_SITE_OTHER): Payer: Medicare Other | Admitting: Cardiology

## 2018-02-10 DIAGNOSIS — I4891 Unspecified atrial fibrillation: Secondary | ICD-10-CM | POA: Diagnosis not present

## 2018-02-10 DIAGNOSIS — Z7901 Long term (current) use of anticoagulants: Secondary | ICD-10-CM

## 2018-02-10 DIAGNOSIS — Z8673 Personal history of transient ischemic attack (TIA), and cerebral infarction without residual deficits: Secondary | ICD-10-CM

## 2018-02-10 NOTE — Telephone Encounter (Signed)
Critical EKG alert received from [employer]. Event date 02/09/18 Atrial flutter/with 3 beats PVCs Duration: 1 min- auto drigger Addressed at 02/05/18 appt with Dr.Berry. Strips placed in Dr.Berry's box for review.

## 2018-02-18 ENCOUNTER — Encounter: Payer: Self-pay | Admitting: Cardiology

## 2018-02-18 ENCOUNTER — Other Ambulatory Visit: Payer: Self-pay | Admitting: *Deleted

## 2018-02-18 ENCOUNTER — Ambulatory Visit (INDEPENDENT_AMBULATORY_CARE_PROVIDER_SITE_OTHER): Payer: Medicare Other | Admitting: Cardiology

## 2018-02-18 VITALS — BP 124/86 | HR 90 | Ht 74.0 in | Wt 182.0 lb

## 2018-02-18 DIAGNOSIS — I4821 Permanent atrial fibrillation: Secondary | ICD-10-CM | POA: Diagnosis not present

## 2018-02-18 MED ORDER — CARVEDILOL 25 MG PO TABS: 25.0000 mg | ORAL_TABLET | Freq: Two times a day (BID) | ORAL | 2 refills | Status: DC

## 2018-02-18 NOTE — Progress Notes (Signed)
Electrophysiology Office Note   Date:  02/18/2018   ID:  Khalee, Mazo [birth year], MRN [id number]  PCP:  Denita Lung, MD  Cardiologist:  Gwenlyn Found Primary Electrophysiologist:  Constance Haw, MD    No chief complaint on file.    History of Present Illness: Eric Ross is a 63 y.o. male who is being seen today for the evaluation of wide complex tachycardia at the request of Lorretta Harp, MD. Presenting today for electrophysiology evaluation.  He has a history of permanent atrial fibrillation, nonischemic cardiomyopathy, hyperlipidemia, and stroke.  Comes in today complaining mainly of palpitations.  He has been in atrial fibrillation for quite some time but the palpitations only just started.  He says that he can tell when his heart is beating quickly.  He wore a cardiac monitor that showed heart rates up to 160 with atrial fibrillation.    Today, he denies symptoms of chest pain, shortness of breath, orthopnea, PND, lower extremity edema, claudication, dizziness, presyncope, syncope, bleeding, or neurologic sequela. The patient is tolerating medications without difficulties.    Past Medical History:  Diagnosis Date  . Atrial fibrillation ([place])    Dr. Gwenlyn Found  . Cardiomyopathy    nonischemic  . Erectile dysfunction   . History of thyroid cancer   . Hyperlipidemia   . Hypothyroidism   . Pseudogout   . Stroke [facility]) 2003   Past Surgical History:  Procedure Laterality Date  . CARDIAC CATHETERIZATION  09/22/01   NORMAL CORONARY ARTERIES  . JOINT REPLACEMENT     BILATERAL KNEE   . LEXISCAN MYOCARDIAL PERFUSION STUDY  05/30/11   NORMAL MYOCARDIAL PERFUSION STUDY EF% 49%.  . THYROIDECTOMY    . TRANSTHORACIC ECHOCARDIOGRAM  05/30/11   EF%=45-50%. NORMAL LV WALL THICKNESS. RV IS MILDLY DILATED. NO SIGNIFICANT VALVE DISEASE.      Current Outpatient Medications  Medication Sig Dispense Refill  . carvedilol (COREG) 12.5 MG tablet Take 1.5 tablets (18.75 mg  total) by mouth 2 (two) times daily. 180 tablet 5  . levothyroxine (SYNTHROID, LEVOTHROID) 112 MCG tablet TAKE 1 TABLET BY MOUTH EVERY DAY 90 tablet 0  . pantoprazole (PROTONIX) 40 MG tablet TAKE 1 TABLET (40 MG TOTAL) BY MOUTH DAILY. 90 tablet 3  . ramipril (ALTACE) 2.5 MG capsule Take 1 capsule (2.5 mg total) by mouth daily. 90 capsule 3  . simvastatin (ZOCOR) 20 MG tablet TAKE 1 TABLET (20 MG TOTAL) BY MOUTH DAILY AT 6 PM. 90 tablet 3  . traMADol (ULTRAM) 50 MG tablet Take 1 tablet (50 mg total) by mouth every 8 (eight) hours as needed. 30 tablet 0  . warfarin (COUMADIN) 5 MG tablet Take 1/2 to 1 tablet daily as directed by coumadin clinic 60 tablet 0   No current facility-administered medications for this visit.     Allergies:   Penicillins   Social History:  The patient  reports that he has never smoked. His smokeless tobacco use includes chew. He reports that he does not drink alcohol or use drugs.   Family History:  The patient's family history includes Cancer in his mother and sister; Stroke in his brother.    ROS:  Please see the history of present illness.   Otherwise, review of systems is positive for chest pain, palpitations, shortness of breath, back pain, dizziness, headaches.   All other systems are reviewed and negative.    PHYSICAL EXAM: VS:  BP 124/86   Pulse 90   Ht 6\' 2"  (  1.88 m)   Wt 182 lb (82.6 kg)   SpO2 99%   BMI 23.37 kg/m  , BMI Body mass index is 23.37 kg/m. GEN: Well nourished, well developed, in no acute distress  HEENT: normal  Neck: no JVD, carotid bruits, or masses Cardiac: iRRR; no murmurs, rubs, or gallops,no edema  Respiratory:  clear to auscultation bilaterally, normal work of breathing GI: soft, nontender, nondistended, + BS MS: no deformity or atrophy  Skin: warm and dry Neuro:  Strength and sensation are intact Psych: euthymic mood, full affect  EKG:  EKG is not ordered today. Personal review of the ekg ordered 02/05/18 shows AF, rate  101  Recent Labs: No results found for requested labs within last 8760 hours.    Lipid Panel     Component Value Date/Time   CHOL 156 02/15/2017 1534   TRIG 138 02/15/2017 1534   HDL 45 02/15/2017 1534   CHOLHDL 3.5 02/15/2017 1534   CHOLHDL 3.6 04/25/2016 1402   VLDL 30 04/25/2016 1402   LDLCALC 83 02/15/2017 1534     Wt Readings from Last 3 Encounters:  02/18/18 182 lb (82.6 kg)  02/05/18 184 lb (83.5 kg)  01/08/18 184 lb (83.5 kg)      Other studies Reviewed: Additional studies/ records that were reviewed today include: TTE 01/15/18  Review of the above records today demonstrates:  - Left ventricle: The cavity size was normal. Wall thickness was   normal. The estimated ejection fraction was 45%. Diffuse   hypokinesis. The study was not technically sufficient to allow   evaluation of LV diastolic dysfunction due to atrial   fibrillation. - Aortic valve: There was no stenosis. - Mitral valve: There was trivial regurgitation. - Left atrium: The atrium was moderately dilated. - Right ventricle: The cavity size was mildly dilated. Systolic   function was normal. - Right atrium: The atrium was mildly dilated. - Tricuspid valve: Peak RV-RA gradient (S): 23 mm Hg. - Pulmonary arteries: PA peak pressure: 26 mm Hg (S). - Inferior vena cava: The vessel was normal in size. The   respirophasic diameter changes were in the normal range (= 50%),   consistent with normal central venous pressure.  Myoview 01/08/18  Nuclear stress EF: 34%.  The left ventricular ejection fraction is moderately decreased (30-44%).  There was no ST segment deviation noted during stress.  This is an intermediate risk study.   1. Atrial fibrillation noted.  2. EF 34%, diffuse hypokinesis.   3. No evidence for ischemia or infarction by perfusion images.      ASSESSMENT AND PLAN:  1.  Permanent atrial fibrillation: Has a history of stroke.  He is subsequently on Coumadin.  He has had  multiple episodes of rapid atrial fibrillation as diagnosed on his cardiac monitor.  He also had some wide-complex tachycardia that appears to be due to aberrancy.  Due to his rapid rates, I Noboru Bidinger maximize his carvedilol to see if this helps with his palpitations.  He does get dizzy at times.  I am concerned that this may be due to bradycardia.  He may need a pacemaker in the future.  2.  Essential hypertension: Well-controlled today.  3.  Hyperlipidemia: Statin therapy per primary cardiology.  Current medicines are reviewed at length with the patient today.   The patient has concerns regarding his medicines.  The following changes were made today: Increase carvedilol  Labs/ tests ordered today include: none No orders of the defined types were placed in this  encounter.  Case discussed with primary cardiologist  Disposition:   FU with Anatole Apollo 3 months  Signed, Lilliauna Van Meredith Leeds, MD  02/18/2018 4:23 PM     [facility] [address] [place] [address] [phone] (office) [phone] (fax)

## 2018-02-18 NOTE — Patient Instructions (Addendum)
Medication Instructions:  Your physician has recommended you make the following change in your medication:  1. INCREASE Carvedilol to 25 mg twice daily  * If you need a refill on your cardiac medications before your next appointment, please call your pharmacy.   Labwork: None ordered.  Testing/Procedures: None ordered  Follow-Up: Your physician recommends that you schedule a follow-up appointment in: 3 months with Dr. Curt Bears.  Thank you for choosing [facility]!!   Trinidad Curet, RN [phone]  Any Other Special Instructions Will Be Listed Below (If Applicable).

## 2018-02-20 ENCOUNTER — Telehealth: Payer: Self-pay | Admitting: Family Medicine

## 2018-02-20 NOTE — Telephone Encounter (Signed)
Called pt reached vm, lmtrc needs awv with fasting labs

## 2018-03-03 ENCOUNTER — Other Ambulatory Visit: Payer: Self-pay | Admitting: Family Medicine

## 2018-03-03 DIAGNOSIS — E7800 Pure hypercholesterolemia, unspecified: Secondary | ICD-10-CM

## 2018-03-12 DIAGNOSIS — I482 Chronic atrial fibrillation, unspecified: Secondary | ICD-10-CM | POA: Diagnosis not present

## 2018-03-12 DIAGNOSIS — Z7901 Long term (current) use of anticoagulants: Secondary | ICD-10-CM | POA: Diagnosis not present

## 2018-03-13 LAB — PROTIME-INR: INR: 3.5 — AB (ref 0.9–1.1)

## 2018-03-14 ENCOUNTER — Ambulatory Visit (INDEPENDENT_AMBULATORY_CARE_PROVIDER_SITE_OTHER): Payer: Medicare Other | Admitting: Pharmacist Clinician (PhC)/ Clinical Pharmacy Specialist

## 2018-03-14 DIAGNOSIS — I4891 Unspecified atrial fibrillation: Secondary | ICD-10-CM

## 2018-03-14 DIAGNOSIS — Z7901 Long term (current) use of anticoagulants: Secondary | ICD-10-CM

## 2018-03-14 DIAGNOSIS — Z8673 Personal history of transient ischemic attack (TIA), and cerebral infarction without residual deficits: Secondary | ICD-10-CM

## 2018-03-25 ENCOUNTER — Other Ambulatory Visit: Payer: Self-pay | Admitting: Family Medicine

## 2018-03-25 DIAGNOSIS — K219 Gastro-esophageal reflux disease without esophagitis: Secondary | ICD-10-CM

## 2018-03-27 ENCOUNTER — Other Ambulatory Visit: Payer: Self-pay | Admitting: Family Medicine

## 2018-03-27 ENCOUNTER — Other Ambulatory Visit: Payer: Self-pay | Admitting: Cardiovascular Disease

## 2018-03-27 DIAGNOSIS — I10 Essential (primary) hypertension: Secondary | ICD-10-CM

## 2018-04-10 ENCOUNTER — Telehealth: Payer: Self-pay | Admitting: *Deleted

## 2018-04-10 ENCOUNTER — Other Ambulatory Visit: Payer: Self-pay | Admitting: *Deleted

## 2018-04-10 DIAGNOSIS — E039 Hypothyroidism, unspecified: Secondary | ICD-10-CM

## 2018-04-10 MED ORDER — LEVOTHYROXINE SODIUM 112 MCG PO TABS: ORAL_TABLET | ORAL | 0 refills | Status: DC

## 2018-04-10 NOTE — Telephone Encounter (Signed)
error 

## 2018-04-11 ENCOUNTER — Other Ambulatory Visit: Payer: Self-pay | Admitting: Family Medicine

## 2018-04-11 DIAGNOSIS — E039 Hypothyroidism, unspecified: Secondary | ICD-10-CM

## 2018-04-23 ENCOUNTER — Ambulatory Visit: Payer: Self-pay | Admitting: Family Medicine

## 2018-04-25 ENCOUNTER — Ambulatory Visit: Payer: Medicare Other | Admitting: Cardiology

## 2018-05-01 ENCOUNTER — Telehealth: Payer: Self-pay | Admitting: Family Medicine

## 2018-05-01 DIAGNOSIS — I10 Essential (primary) hypertension: Secondary | ICD-10-CM

## 2018-05-01 MED ORDER — RAMIPRIL 2.5 MG PO CAPS: 2.5000 mg | ORAL_CAPSULE | Freq: Every day | ORAL | 0 refills | Status: DC

## 2018-05-01 NOTE — Telephone Encounter (Signed)
Pt wife called and is requesting a refill on pt ramipril pt needs it sent to the [facility], [place] - [address]

## 2018-05-07 ENCOUNTER — Telehealth: Payer: Self-pay | Admitting: Family Medicine

## 2018-05-07 NOTE — Telephone Encounter (Signed)
Take care of this 

## 2018-05-07 NOTE — Telephone Encounter (Signed)
Wife called & states pt got jury summons and was excused years ago and was surprised they got another summons.  Wants letter excusing him from jury duty mailed to her.  Pt has had a stroke and is unable to talk or read.

## 2018-05-08 ENCOUNTER — Encounter: Payer: Self-pay | Admitting: Family Medicine

## 2018-05-08 NOTE — Telephone Encounter (Signed)
Letter typed

## 2018-05-13 ENCOUNTER — Encounter: Payer: Self-pay | Admitting: *Deleted

## 2018-05-13 ENCOUNTER — Telehealth: Payer: Self-pay | Admitting: *Deleted

## 2018-05-13 NOTE — Telephone Encounter (Signed)
Virtual Visit Pre-Appointment Phone Call  Steps For Call:  Confirm consent - "In the setting of the current Covid19 crisis, you are scheduled for a (phone or video) visit with your provider on (date) at (time).  Just as we do with many in-office visits, in order for you to participate in this visit, we must obtain consent.  If you'd like, I can send this to your mychart (if signed up) or email for you to review.  Otherwise, I can obtain your verbal consent now.  All virtual visits are billed to your insurance company just like a normal visit would be.  By agreeing to a virtual visit, we'd like you to understand that the technology does not allow for your provider to perform an examination, and thus may limit your provider's ability to fully assess your condition.  Finally, though the technology is pretty good, we cannot assure that it will always work on either your or our end, and in the setting of a video visit, we may have to convert it to a phone-only visit.  In either situation, we cannot ensure that we have a secure connection.  Are you willing to proceed?" YES per Wife  1. Confirm the BEST phone number to call the day of the visit by including in appointment notes  2. Give patient instructions for WebEx/MyChart download to smartphone as below or Doximity/Doxy.me if video visit (depending on what platform provider is using)  3. Advise patient to be prepared with their blood pressure, heart rate, weight, any heart rhythm information, their current medicines, and a piece of paper and pen handy for any instructions they may receive the day of their visit  4. Inform patient they will receive a phone call 15 minutes prior to their appointment time (may be from unknown caller ID) so they should be prepared to answer  5. Confirm that appointment type is correct in Epic appointment notes (VIDEO vs PHONE)     TELEPHONE CALL NOTE  SHAWNE ESKELSON has been deemed a candidate for a follow-up  tele-health visit to limit community exposure during the Covid-19 pandemic. I spoke with the patient via phone to ensure availability of phone/video source, confirm preferred email & phone number, and discuss instructions and expectations.  I reminded CLAY SOLUM to be prepared with any vital sign and/or heart rhythm information that could potentially be obtained via home monitoring, at the time of his visit. I reminded CHILD CAMPOY to expect a phone call at the time of his visit if his visit.  Victoriya Pol 05/13/2018 4:38 PM   INSTRUCTIONS FOR DOWNLOADING THE [place] APP TO SMARTPHONE  - If Apple, ask patient to go to [employer] and type in WebEx in the search bar. [place] [employer], the blue/green circle. If Android, go to [employer] and type in [employer] in the search bar. The app is free but as with any other app downloads, their phone may require them to verify saved payment information or Apple/Android password.  - The patient does NOT have to create an account. - On the day of the visit, the assist will walk the patient through joining the meeting with the meeting number/password.  INSTRUCTIONS FOR DOWNLOADING THE MYCHART APP TO SMARTPHONE  - The patient must first make sure to have activated MyChart and know their login information - If Apple, go to [employer] and type in MyChart in the search bar and download the app. If Android, ask patient  to go to [employer] and type in [place] in the search bar and download the app. The app is free but as with any other app downloads, their phone may require them to verify saved payment information or Apple/Android password.  - The patient will need to then log into the app with their MyChart username and password, and select [facility] as their healthcare provider to link the account. When it is time for your visit, go to the MyChart app, find appointments, and click Begin Video Visit. Be sure to Select Allow for your  device to access the Microphone and Camera for your visit. You will then be connected, and your provider will be with you shortly.  **If they have any issues connecting, or need assistance please contact MyChart service desk [phone])**  **If using a computer, in order to ensure the best quality for their visit they will need to use either of the following Internet Browsers: [employer], or Google Chrome**  IF USING DOXIMITY or DOXY.ME - The patient will receive a link just prior to their visit, either by text or email (to be determined day of appointment depending on if it's doxy.me or Doximity).     FULL LENGTH CONSENT FOR TELE-HEALTH VISIT   I hereby voluntarily request, consent and authorize [facility] and its employed or contracted physicians, physician assistants, nurse practitioners or other licensed health care professionals (the Practitioner), to provide me with telemedicine health care services (the Services") as deemed necessary by the treating Practitioner. I acknowledge and consent to receive the Services by the Practitioner via telemedicine. I understand that the telemedicine visit will involve communicating with the Practitioner through live audiovisual communication technology and the disclosure of certain medical information by electronic transmission. I acknowledge that I have been given the opportunity to request an in-person assessment or other available alternative prior to the telemedicine visit and am voluntarily participating in the telemedicine visit.  I understand that I have the right to withhold or withdraw my consent to the use of telemedicine in the course of my care at any time, without affecting my right to future care or treatment, and that the Practitioner or I may terminate the telemedicine visit at any time. I understand that I have the right to inspect all information obtained and/or recorded in the course of the telemedicine visit and may  receive copies of available information for a reasonable fee.  I understand that some of the potential risks of receiving the Services via telemedicine include:   Delay or interruption in medical evaluation due to technological equipment failure or disruption;  Information transmitted may not be sufficient (e.g. poor resolution of images) to allow for appropriate medical decision making by the Practitioner; and/or   In rare instances, security protocols could fail, causing a breach of personal health information.  Furthermore, I acknowledge that it is my responsibility to provide information about my medical history, conditions and care that is complete and accurate to the best of my ability. I acknowledge that Practitioner's advice, recommendations, and/or decision may be based on factors not within their control, such as incomplete or inaccurate data provided by me or distortions of diagnostic images or specimens that may result from electronic transmissions. I understand that the practice of medicine is not an exact science and that Practitioner makes no warranties or guarantees regarding treatment outcomes. I acknowledge that I will receive a copy of this consent concurrently upon execution via email to the email address I  last provided but may also request a printed copy by calling the office of [facility].    I understand that my insurance will be billed for this visit.   I have read or had this consent read to me.  I understand the contents of this consent, which adequately explains the benefits and risks of the Services being provided via telemedicine.   I have been provided ample opportunity to ask questions regarding this consent and the Services and have had my questions answered to my satisfaction.  I give my informed consent for the services to be provided through the use of telemedicine in my medical care  By participating in this telemedicine visit I agree to the above.

## 2018-05-19 NOTE — Telephone Encounter (Signed)
Virtual Visit Pre-Appointment Phone Call  Steps For Call:  1. Confirm consent - "In the setting of the current Covid19 crisis, you are scheduled for a (phone or video) visit with your provider on (date) at (time).  Just as we do with many in-office visits, in order for you to participate in this visit, we must obtain consent.  If you'd like, I can send this to your mychart (if signed up) or email for you to review.  Otherwise, I can obtain your verbal consent now.  All virtual visits are billed to your insurance company just like a normal visit would be.  By agreeing to a virtual visit, we'd like you to understand that the technology does not allow for your provider to perform an examination, and thus may limit your provider's ability to fully assess your condition. If your provider identifies any concerns that need to be evaluated in person, we will make arrangements to do so.  Finally, though the technology is pretty good, we cannot assure that it will always work on either your or our end, and in the setting of a video visit, we may have to convert it to a phone-only visit.  In either situation, we cannot ensure that we have a secure connection.  Are you willing to proceed?" STAFF: Did the patient verbally acknowledge consent to telehealth visit? Document YES/NO here: YES  2. Confirm the BEST phone number to call the day of the visit by including in appointment notes  3. Give patient instructions for WebEx/MyChart download to smartphone as below or Doximity/Doxy.me if video visit (depending on what platform provider is using)  4. Advise patient to be prepared with their blood pressure, heart rate, weight, any heart rhythm information, their current medicines, and a piece of paper and pen handy for any instructions they may receive the day of their visit  5. Inform patient they will receive a phone call 15 minutes prior to their appointment time (may be from unknown caller ID) so they should be  prepared to answer  6. Confirm that appointment type is correct in Epic appointment notes (VIDEO vs PHONE)     TELEPHONE CALL NOTE  Eric Ross has been deemed a candidate for a follow-up tele-health visit to limit community exposure during the Covid-19 pandemic. I spoke with the patient via phone to ensure availability of phone/video source, confirm preferred email & phone number, and discuss instructions and expectations.  I reminded Eric Ross to be prepared with any vital sign and/or heart rhythm information that could potentially be obtained via home monitoring, at the time of his visit. I reminded Eric Ross to expect a phone call at the time of his visit if his visit.  [name], RN 05/19/2018 10:26 AM   INSTRUCTIONS FOR DOWNLOADING THE [place] APP TO SMARTPHONE  - If Apple, ask patient to go to [employer] and type in WebEx in the search bar. [place] [employer], the blue/green circle. If Android, go to [employer] and type in [employer] in the search bar. The app is free but as with any other app downloads, their phone may require them to verify saved payment information or Apple/Android password.  - The patient does NOT have to create an account. - On the day of the visit, the assist will walk the patient through joining the meeting with the meeting number/password.  INSTRUCTIONS FOR DOWNLOADING THE MYCHART APP TO SMARTPHONE  - The patient must first  make sure to have activated MyChart and know their login information - If Apple, go to [employer] and type in MyChart in the search bar and download the app. If Android, ask patient to go to [employer] and type in [place] in the search bar and download the app. The app is free but as with any other app downloads, their phone may require them to verify saved payment information or Apple/Android password.  - The patient will need to then log into the app with their MyChart username and password, and  select [facility] as their healthcare provider to link the account. When it is time for your visit, go to the MyChart app, find appointments, and click Begin Video Visit. Be sure to Select Allow for your device to access the Microphone and Camera for your visit. You will then be connected, and your provider will be with you shortly.  **If they have any issues connecting, or need assistance please contact MyChart service desk [phone])**  **If using a computer, in order to ensure the best quality for their visit they will need to use either of the following Internet Browsers: [employer], or Google Chrome**  IF USING DOXIMITY or DOXY.ME - The patient will receive a link just prior to their visit, either by text or email (to be determined day of appointment depending on if it's doxy.me or Doximity).     FULL LENGTH CONSENT FOR TELE-HEALTH VISIT   I hereby voluntarily request, consent and authorize [facility] and its employed or contracted physicians, physician assistants, nurse practitioners or other licensed health care professionals (the Practitioner), to provide me with telemedicine health care services (the "Services") as deemed necessary by the treating Practitioner. I acknowledge and consent to receive the Services by the Practitioner via telemedicine. I understand that the telemedicine visit will involve communicating with the Practitioner through live audiovisual communication technology and the disclosure of certain medical information by electronic transmission. I acknowledge that I have been given the opportunity to request an in-person assessment or other available alternative prior to the telemedicine visit and am voluntarily participating in the telemedicine visit.  I understand that I have the right to withhold or withdraw my consent to the use of telemedicine in the course of my care at any time, without affecting my right to future care or treatment, and that  the Practitioner or I may terminate the telemedicine visit at any time. I understand that I have the right to inspect all information obtained and/or recorded in the course of the telemedicine visit and may receive copies of available information for a reasonable fee.  I understand that some of the potential risks of receiving the Services via telemedicine include:  [name] Delay or interruption in medical evaluation due to technological equipment failure or disruption; . Information transmitted may not be sufficient (e.g. poor resolution of images) to allow for appropriate medical decision making by the Practitioner; and/or  . In rare instances, security protocols could fail, causing a breach of personal health information.  Furthermore, I acknowledge that it is my responsibility to provide information about my medical history, conditions and care that is complete and accurate to the best of my ability. I acknowledge that Practitioner's advice, recommendations, and/or decision may be based on factors not within their control, such as incomplete or inaccurate data provided by me or distortions of diagnostic images or specimens that may result from electronic transmissions. I understand that the practice of medicine is not an  exact science and that Practitioner makes no warranties or guarantees regarding treatment outcomes. I acknowledge that I will receive a copy of this consent concurrently upon execution via email to the email address I last provided but may also request a printed copy by calling the office of [facility].    I understand that my insurance will be billed for this visit.   I have read or had this consent read to me. . I understand the contents of this consent, which adequately explains the benefits and risks of the Services being provided via telemedicine.  . I have been provided ample opportunity to ask questions regarding this consent and the Services and have had my questions answered to  my satisfaction. . I give my informed consent for the services to be provided through the use of telemedicine in my medical care  By participating in this telemedicine visit I agree to the above.

## 2018-05-20 ENCOUNTER — Telehealth (INDEPENDENT_AMBULATORY_CARE_PROVIDER_SITE_OTHER): Payer: Medicare Other | Admitting: Cardiology

## 2018-05-20 ENCOUNTER — Other Ambulatory Visit: Payer: Self-pay

## 2018-05-20 DIAGNOSIS — I4821 Permanent atrial fibrillation: Secondary | ICD-10-CM | POA: Diagnosis not present

## 2018-05-20 MED ORDER — CARVEDILOL 25 MG PO TABS: 37.5000 mg | ORAL_TABLET | Freq: Two times a day (BID) | ORAL | 2 refills | Status: DC

## 2018-05-20 NOTE — Progress Notes (Signed)
Electrophysiology TeleHealth Note   Due to national recommendations of social distancing due to COVID 19, an audio/video telehealth visit is felt to be most appropriate for this patient at this time.  See Epic message for the patient's consent to telehealth for [facility].   Date:  05/20/2018   ID:  Eric Ross, DOB [birth year], MRN [id number]  Location: patient's home  Provider location: [address], [place]  Evaluation Performed: Follow-up visit  PCP:  Denita Lung, MD  Cardiologist:  Gwenlyn Found Electrophysiologist:  Dr Curt Bears  Chief Complaint:  AF  History of Present Illness:    Eric Ross is a 63 y.o. male who presents via audio/video conferencing for a telehealth visit today.  Since last being seen in our clinic, the patient reports doing very well.  Today, he denies symptoms of palpitations, chest pain, shortness of breath,  lower extremity edema, dizziness, presyncope, or syncope.  The patient is otherwise without complaint today.  The patient denies symptoms of fevers, chills, cough, or new SOB worrisome for COVID 19.  Today, denies symptoms of palpitations, chest pain, shortness of breath, orthopnea, PND, lower extremity edema, claudication, dizziness, presyncope, syncope, bleeding, or neurologic sequela. The patient is tolerating medications without difficulties.  He is overall doing well.  He does continue to have episodes of dizziness and weakness, but this is been improved with an increased dose of carvedilol.  His episodes are down to a few times a week.  They were occurring most days.  Past Medical History:  Diagnosis Date  . Atrial fibrillation ([place])    Dr. Gwenlyn Found  . Cardiomyopathy    nonischemic  . Erectile dysfunction   . History of thyroid cancer   . Hyperlipidemia   . Hypothyroidism   . Pseudogout   . Stroke [facility]) 2003    Past Surgical History:  Procedure Laterality Date  . CARDIAC CATHETERIZATION  09/22/01   NORMAL  CORONARY ARTERIES  . JOINT REPLACEMENT     BILATERAL KNEE   . LEXISCAN MYOCARDIAL PERFUSION STUDY  05/30/11   NORMAL MYOCARDIAL PERFUSION STUDY EF% 49%.  . THYROIDECTOMY    . TRANSTHORACIC ECHOCARDIOGRAM  05/30/11   EF%=45-50%. NORMAL LV WALL THICKNESS. RV IS MILDLY DILATED. NO SIGNIFICANT VALVE DISEASE.     Current Outpatient Medications  Medication Sig Dispense Refill  . levothyroxine (SYNTHROID, LEVOTHROID) 112 MCG tablet TAKE 1 TABLET BY MOUTH EVERY DAY 90 tablet 0  . pantoprazole (PROTONIX) 40 MG tablet TAKE 1 TABLET BY MOUTH EVERY DAY 90 tablet 0  . ramipril (ALTACE) 2.5 MG capsule Take 1 capsule (2.5 mg total) by mouth daily. 90 capsule 0  . simvastatin (ZOCOR) 20 MG tablet TAKE 1 TABLET (20 MG TOTAL) BY MOUTH DAILY AT 6 PM. 90 tablet 1  . traMADol (ULTRAM) 50 MG tablet Take 1 tablet (50 mg total) by mouth every 8 (eight) hours as needed. 30 tablet 0  . warfarin (COUMADIN) 5 MG tablet TAKE 1/2 TO 1 TABLET BY MOUTH DAILY AS DIRECTED BY COUMADIN CLINIC. 90 tablet 0  . carvedilol (COREG) 25 MG tablet Take 1 tablet (25 mg total) by mouth 2 (two) times daily. 180 tablet 2   No current facility-administered medications for this visit.     Allergies:   Penicillins   Social History:  The patient  reports that he has never smoked. His smokeless tobacco use includes chew. He reports that he does not drink alcohol or use drugs.   Family History:  The patient's  family history includes Cancer in his sister; Stroke in his brother; Thyroid cancer in his mother.   ROS:  Please see the history of present illness.   All other systems are personally reviewed and negative.    Exam:    Vital Signs:  Pulse 84   SpO2 94%   Well appearing, alert and conversant, regular work of breathing,  good skin color Eyes- anicteric, neuro- grossly intact, skin- no apparent rash or lesions or cyanosis, mouth- oral mucosa is pink   Labs/Other Tests and Data Reviewed:    Recent Labs: No results found for  requested labs within last 8760 hours.   Wt Readings from Last 3 Encounters:  02/18/18 182 lb (82.6 kg)  02/05/18 184 lb (83.5 kg)  01/08/18 184 lb (83.5 kg)     Other studies personally reviewed: Additional studies/ records that were reviewed today include: ECG 02/05/18  Review of the above records today demonstrates:   AF, rate 101  Monitor 02/27/18 personally reviewed 1: Atrial fibrillation with variable ventricular response 2: Short runs of nonsustained ventricular tachycardia.   ASSESSMENT & PLAN:    1.  Permanent atrial fibrillation on coumadin with recent increase in coreg. He is feeling less dizziness or palpitations but still having weekly episodes. Eric Ross increase coreg to 37.5 mg BID.  This patients CHA2DS2-VASc Score and unadjusted Ischemic Stroke Rate (% per year) is equal to 3.2 % stroke rate/year from a score of 3  Above score calculated as 1 point each if present [CHF, HTN, DM, Vascular=MI/PAD/Aortic Plaque, Age if 65-74, or Male] Above score calculated as 2 points each if present [Age > 75, or Stroke/TIA/TE]  2. Hypertension: Unfortunately his blood pressure machine did not record his blood pressure today.  His blood pressures in the past have been well controlled.  No changes at this time.  3. Hyperlipidemia: statin per primary cardiology   COVID 19 screen The patient denies symptoms of COVID 19 at this time.  The importance of social distancing was discussed today.  Follow-up:  6 months  Current medicines are reviewed at length with the patient today.   The patient does not have concerns regarding his medicines.  The following changes were made today:  Increase coreg  Labs/ tests ordered today include:  No orders of the defined types were placed in this encounter.    Patient Risk:  after full review of this patients clinical status, I feel that they are at moderate risk at this time.  Today, I have spent 15 minutes with the patient with telehealth  technology discussing atrial fibrillation .    Signed, Eric Ross Meredith Leeds, MD  05/20/2018 11:32 AM     [address] [place] [place] [address] [phone] (office) [phone] (fax)

## 2018-05-20 NOTE — Addendum Note (Signed)
Addended by: [name] on: 05/20/2018 11:42 AM   Modules accepted: Orders

## 2018-06-17 ENCOUNTER — Telehealth: Payer: Self-pay | Admitting: Pharmacist

## 2018-06-17 NOTE — Telephone Encounter (Signed)
Received warfarin refill request from [facility] in [place]. Called pt as he has not had his INR checked since February and is overdue for follow up. Spoke with patient's wife who states pt has his IN[facility] checked at [facility] but has not gone because they did not know if the lab would be open due to coronavirus. Advised her that lab should be open - she states he will go today or tomorrow to have his INR drawn. Will await result and send in refill request once INR has resulted.

## 2018-06-18 ENCOUNTER — Other Ambulatory Visit: Payer: Self-pay | Admitting: Cardiovascular Disease

## 2018-06-18 DIAGNOSIS — I482 Chronic atrial fibrillation, unspecified: Secondary | ICD-10-CM | POA: Diagnosis not present

## 2018-06-18 DIAGNOSIS — Z7901 Long term (current) use of anticoagulants: Secondary | ICD-10-CM | POA: Diagnosis not present

## 2018-06-19 ENCOUNTER — Other Ambulatory Visit: Payer: Self-pay | Admitting: Family Medicine

## 2018-06-19 ENCOUNTER — Other Ambulatory Visit: Payer: Self-pay | Admitting: Pharmacist Clinician (PhC)/ Clinical Pharmacy Specialist

## 2018-06-19 ENCOUNTER — Ambulatory Visit (INDEPENDENT_AMBULATORY_CARE_PROVIDER_SITE_OTHER): Payer: Medicare Other | Admitting: Pharmacist

## 2018-06-19 DIAGNOSIS — K219 Gastro-esophageal reflux disease without esophagitis: Secondary | ICD-10-CM

## 2018-06-19 DIAGNOSIS — Z8673 Personal history of transient ischemic attack (TIA), and cerebral infarction without residual deficits: Secondary | ICD-10-CM

## 2018-06-19 DIAGNOSIS — Z7901 Long term (current) use of anticoagulants: Secondary | ICD-10-CM

## 2018-06-19 DIAGNOSIS — I4891 Unspecified atrial fibrillation: Secondary | ICD-10-CM

## 2018-06-19 LAB — PROTIME-INR
INR: 2.1 — ABNORMAL HIGH
Prothrombin Time: 21.2 s — ABNORMAL HIGH (ref 9.0–11.5)

## 2018-06-19 MED ORDER — WARFARIN SODIUM 5 MG PO TABS: ORAL_TABLET | ORAL | 0 refills | Status: DC

## 2018-06-19 NOTE — Telephone Encounter (Signed)
Pt had INR checked yesterday - see anticoag encounter from 06/19/18.

## 2018-07-03 ENCOUNTER — Other Ambulatory Visit: Payer: Self-pay | Admitting: Family Medicine

## 2018-07-03 DIAGNOSIS — E039 Hypothyroidism, unspecified: Secondary | ICD-10-CM

## 2018-07-22 ENCOUNTER — Telehealth: Payer: Self-pay

## 2018-07-22 NOTE — Telephone Encounter (Signed)
Spoke with pt's wife and informed that Dr. Gwenlyn Found will be in office on scheduled appointment date on 7/1. Appointment changed back to in office visit and wife made aware.

## 2018-07-23 ENCOUNTER — Other Ambulatory Visit: Payer: Self-pay | Admitting: Family Medicine

## 2018-07-23 DIAGNOSIS — I10 Essential (primary) hypertension: Secondary | ICD-10-CM

## 2018-07-30 ENCOUNTER — Ambulatory Visit: Payer: Medicare Other | Admitting: Cardiovascular Disease

## 2018-08-08 ENCOUNTER — Ambulatory Visit (INDEPENDENT_AMBULATORY_CARE_PROVIDER_SITE_OTHER): Payer: Medicare Other | Admitting: Family Medicine

## 2018-08-08 ENCOUNTER — Other Ambulatory Visit: Payer: Self-pay

## 2018-08-08 ENCOUNTER — Encounter: Payer: Self-pay | Admitting: Family Medicine

## 2018-08-08 VITALS — BP 110/72 | HR 88 | Temp 97.3°F | Ht 73.0 in | Wt 183.2 lb

## 2018-08-08 DIAGNOSIS — N529 Male erectile dysfunction, unspecified: Secondary | ICD-10-CM | POA: Diagnosis not present

## 2018-08-08 DIAGNOSIS — E7800 Pure hypercholesterolemia, unspecified: Secondary | ICD-10-CM | POA: Diagnosis not present

## 2018-08-08 DIAGNOSIS — E039 Hypothyroidism, unspecified: Secondary | ICD-10-CM

## 2018-08-08 DIAGNOSIS — I482 Chronic atrial fibrillation, unspecified: Secondary | ICD-10-CM

## 2018-08-08 DIAGNOSIS — Z1211 Encounter for screening for malignant neoplasm of colon: Secondary | ICD-10-CM

## 2018-08-08 DIAGNOSIS — M545 Low back pain, unspecified: Secondary | ICD-10-CM

## 2018-08-08 DIAGNOSIS — R4701 Aphasia: Secondary | ICD-10-CM | POA: Diagnosis not present

## 2018-08-08 DIAGNOSIS — Z7901 Long term (current) use of anticoagulants: Secondary | ICD-10-CM

## 2018-08-08 DIAGNOSIS — K219 Gastro-esophageal reflux disease without esophagitis: Secondary | ICD-10-CM | POA: Diagnosis not present

## 2018-08-08 DIAGNOSIS — Z8673 Personal history of transient ischemic attack (TIA), and cerebral infarction without residual deficits: Secondary | ICD-10-CM

## 2018-08-08 DIAGNOSIS — G8929 Other chronic pain: Secondary | ICD-10-CM

## 2018-08-08 DIAGNOSIS — M25532 Pain in left wrist: Secondary | ICD-10-CM | POA: Diagnosis not present

## 2018-08-08 DIAGNOSIS — I10 Essential (primary) hypertension: Secondary | ICD-10-CM

## 2018-08-08 MED ORDER — LEVOTHYROXINE SODIUM 112 MCG PO TABS: 112.0000 ug | ORAL_TABLET | Freq: Every day | ORAL | 3 refills | Status: DC

## 2018-08-08 MED ORDER — TADALAFIL 20 MG PO TABS: 20.0000 mg | ORAL_TABLET | Freq: Every day | ORAL | 1 refills | Status: DC | PRN

## 2018-08-08 MED ORDER — SIMVASTATIN 20 MG PO TABS: 20.0000 mg | ORAL_TABLET | Freq: Every day | ORAL | 3 refills | Status: DC

## 2018-08-08 MED ORDER — PANTOPRAZOLE SODIUM 40 MG PO TBEC: 40.0000 mg | DELAYED_RELEASE_TABLET | Freq: Every day | ORAL | 0 refills | Status: DC

## 2018-08-08 NOTE — Progress Notes (Signed)
Eric Ross is a 63 y.o. male who presents for annual wellness visit and follow-up on chronic medical conditions.  He has had continued difficulty with back and wrist pain.  He has been seen in the past by orthopedics but has not been seen in quite some time.  He is frustrated over this.  Does have a previous history of atrial fib with CVA with expressive aphasia.  He continues on Coumadin and is followed by cardiology for that.  He also has reflux disease and does use pantoprazole for that.  He continues on Coreg, Altace for his blood pressure.  He is also taking Zocor for cholesterol and continues on Synthroid.  He is having no difficulty with those medications.   Immunizations and Health Maintenance Immunization History  Administered Date(s) Administered  . Influenza Inj Mdck Quad Pf 10/19/2016, 10/09/2017  . Influenza Split 10/04/2014  . Influenza, Seasonal, Injecte, Preservative Fre 10/09/2017  . Influenza-Unspecified 10/22/2015  . Td 04/26/2006   Health Maintenance Due  Topic Date Due  . HIV Screening  01/18/1970  . TETANUS/TDAP  04/25/2016  . COLONOSCOPY  05/30/2016    Last colonoscopy: 05-31-2006 Last PSA: unkown Dentist: 3-4 years Ophtho: two years ago Exercise: no due to pain  keeping up at night   Other doctors caring for patient include:Berry.  Advanced Directives:No Info given Does Patient Have a Medical Advance Directive?: No Would patient like information on creating a medical advance directive?: Yes (MAU/Ambulatory/Procedural Areas - Information given)  Depression screen:  See questionnaire below.     Depression screen [facility] 2/9 08/08/2018 11/27/2017 02/15/2017 06/24/2012  Decreased Interest 0 0 0 0  Down, Depressed, Hopeless 0 0 0 0  PHQ - 2 Score 0 0 0 0    Fall Screen: See Questionaire below.   Fall Risk  08/08/2018 11/27/2017  Falls in the past year? 1 No  Number falls in past yr: 1 -  Injury with Fall? 1 -  Falls are related to previous CVA.  ADL  screen:  See questionnaire below.  Functional Status Survey: Is the patient deaf or have difficulty hearing?: No Does the patient have difficulty seeing, even when wearing glasses/contacts?: No Does the patient have difficulty concentrating, remembering, or making decisions?: No Does the patient have difficulty walking or climbing stairs?: Yes(knees) Does the patient have difficulty dressing or bathing?: No Does the patient have difficulty doing errands alone such as visiting a doctor's office or shopping?: No   Review of Systems  Constitutional: -, -unexpected weight change, -anorexia, -fatigue Allergy: -sneezing, -itching, -congestion Dermatology: denies changing moles, rash, lumps ENT: -runny nose, -ear pain, -sore throat,  Cardiology:  -chest pain, -palpitations, -orthopnea, Respiratory: -cough, -shortness of breath, -dyspnea on exertion, -wheezing,  Gastroenterology: -abdominal pain, -nausea, -vomiting, -diarrhea, -constipation, -dysphagia Hematology: -bleeding or bruising problems  Ophthalmology: -vision changes,  Urology: -dysuria, -difficulty urinating,  -urinary frequency, -urgency, incontinence Neurology: -, -numbness, , -memory loss, -falls, -dizziness    PHYSICAL EXAM:  BP 110/72 (BP Location: Left Arm, Patient Position: Sitting)   Pulse 88   Temp (!) 97.3 F (36.3 C)   Ht 6\' 1"  (1.854 m)   Wt 183 lb 3.2 oz (83.1 kg)   SpO2 97%   BMI 24.17 kg/m   General Appearance: Alert, cooperative, no distress, appears stated age Head: Normocephalic, without obvious abnormality, atraumatic Eyes: PERRL, conjunctiva/corneas clear, EOM's intact, fundi benign Ears: Normal TM's and external ear canals Nose: Nares normal, mucosa normal, no drainage or sinus   tenderness Throat:  Lips, mucosa, and tongue normal; teeth and gums normal Neck: Supple, no lymphadenopathy, thyroid:no enlargement/tenderness/nodules; no carotid bruit or JVD Lungs: Clear to auscultation bilaterally  without wheezes, rales or ronchi; respirations unlabored Heart: Irregular rate and rhythm, S1 and S2 normal, no murmur, rub or gallop Abdomen: Soft, non-tender, nondistended, normoactive bowel sounds, no masses, no hepatosplenomegaly Extremities: No clubbing, cyanosis or edema Pulses: 2+ and symmetric all extremities Skin: Skin color, texture, turgor normal, no rashes or lesions Lymph nodes: Cervical, supraclavicular, and axillary nodes normal Neurologic: CNII-XII intact, normal strength, sensation and gait; reflexes 2+ and symmetric throughout   Psych: Normal mood, affect, hygiene and grooming  ASSESSMENT/PLAN: Essential hypertension - Plan: CBC with Differential/Platelet, Comprehensive metabolic panel, continue on present medications  Chronic atrial fibrillation -  Left wrist pain - Plan: I will review the orthopedic notes.  History of CVA (cerebrovascular accident) - chronic low back pain without sciatica, unspecified back pain laterality - Plan: Review notes from orthopedics  Hypothyroidism, unspecified type - Plan: TSH, levothyroxine (SYNTHROID) 112 MCG tablet,   Gastroesophageal reflux disease, esophagitis presence not specified - Plan: pantoprazole (PROTONIX) 40 MG tablet, discussed possibly cutting back on the use of Protonix and using it every other day or potentially even on a as needed basis.  Expressive aphasia -   Long term current use of anticoagulant therapy - Plan: Continue Coumadin.  Did discuss possibly using a different medication but they are not interested at this time.  Pure hypercholesterolemia - Plan: Lipid panel, simvastatin (ZOCOR) 20 MG tablet  Erectile dysfunction, unspecified erectile dysfunction type - Plan: tadalafil (CIALIS) 20 MG tablet, discussed the use of Cialis and side effects.  He will call if he has any difficulties. Screening for colon cancer - Plan: Cologuard,  Discussed PSA screening (risks/benefits), recommended at least 30 minutes of  aerobic activity at least 5 days/week; . Immunization recommendations discussed.  Colonoscopy recommendations reviewed.  Recommend getting Shingrix and Tdap.  He will do this at the drugstore.   Medicare Attestation I have personally reviewed: The patient's medical and social history Their use of alcohol, tobacco or illicit drugs Their current medications and supplements The patient's functional ability including ADLs,fall risks, home safety risks, cognitive, and hearing and visual impairment Diet and physical activities Evidence for depression or mood disorders  The patient's weight, height, and BMI have been recorded in the chart.  I have made referrals, counseling, and provided education to the patient based on review of the above and I have provided the patient with a written personalized care plan for preventive services.     Jill Alexanders, MD   08/08/2018

## 2018-08-08 NOTE — Patient Instructions (Signed)
  Eric Ross , Thank you for taking time to come for your Medicare Wellness Visit. I appreciate your ongoing commitment to your health goals. Please review the following plan we discussed and let me know if I can assist you in the future.   These are the goals we discussed: I will call you after I have had a chance to review the orthopedic notes. This is a list of the screening recommended for you and due dates:  Health Maintenance  Topic Date Due  . HIV Screening  01/18/1970  . Tetanus Vaccine  04/25/2016  . Colon Cancer Screening  05/30/2016  . Flu Shot  08/30/2018  .  Hepatitis C: One time screening is recommended by Center for Disease Control  (CDC) for  adults born from [birth year] through 1966.   Completed

## 2018-08-09 LAB — CBC WITH DIFFERENTIAL/PLATELET
Basophils Absolute: 0.1 10*3/uL (ref 0.0–0.2)
Basos: 1 %
EOS (ABSOLUTE): 0.3 10*3/uL (ref 0.0–0.4)
Eos: 4 %
Hematocrit: 45.3 % (ref 37.5–51.0)
Hemoglobin: 15 g/dL (ref 13.0–17.7)
Immature Grans (Abs): 0 10*3/uL (ref 0.0–0.1)
Immature Granulocytes: 0 %
Lymphocytes Absolute: 2 10*3/uL (ref 0.7–3.1)
Lymphs: 23 %
MCH: 30.2 pg (ref 26.6–33.0)
MCHC: 33.1 g/dL (ref 31.5–35.7)
MCV: 91 fL (ref 79–97)
Monocytes Absolute: 0.5 10*3/uL (ref 0.1–0.9)
Monocytes: 6 %
Neutrophils Absolute: 5.8 10*3/uL (ref 1.4–7.0)
Neutrophils: 66 %
Platelets: 195 10*3/uL (ref 150–450)
RBC: 4.97 x10E6/uL (ref 4.14–5.80)
RDW: 12.8 % (ref 11.6–15.4)
WBC: 8.8 10*3/uL (ref 3.4–10.8)

## 2018-08-09 LAB — LIPID PANEL
Chol/HDL Ratio: 3.4 ratio (ref 0.0–5.0)
Cholesterol, Total: 157 mg/dL (ref 100–199)
HDL: 46 mg/dL (ref >39)
LDL Calculated: 90 mg/dL (ref 0–99)
Triglycerides: 106 mg/dL (ref 0–149)
VLDL Cholesterol Cal: 21 mg/dL (ref 5–40)

## 2018-08-09 LAB — COMPREHENSIVE METABOLIC PANEL
ALT: 14 IU/L (ref 0–44)
AST: 18 IU/L (ref 0–40)
Albumin/Globulin Ratio: 2.1 (ref 1.2–2.2)
Albumin: 4.7 g/dL (ref 3.8–4.8)
Alkaline Phosphatase: 51 IU/L (ref 39–117)
BUN/Creatinine Ratio: 12 (ref 10–24)
BUN: 12 mg/dL (ref 8–27)
Bilirubin Total: 0.6 mg/dL (ref 0.0–1.2)
CO2: 17 mmol/L — ABNORMAL LOW (ref 20–29)
Calcium: 9.4 mg/dL (ref 8.6–10.2)
Chloride: 105 mmol/L (ref 96–106)
Creatinine, Ser: 1.02 mg/dL (ref 0.76–1.27)
GFR calc Af Amer: 90 mL/min/{1.73_m2} (ref >59)
GFR calc non Af Amer: 78 mL/min/{1.73_m2} (ref >59)
Globulin, Total: 2.2 g/dL (ref 1.5–4.5)
Glucose: 90 mg/dL (ref 65–99)
Potassium: 4.5 mmol/L (ref 3.5–5.2)
Sodium: 138 mmol/L (ref 134–144)
Total Protein: 6.9 g/dL (ref 6.0–8.5)

## 2018-08-09 LAB — TSH: TSH: 1.11 u[IU]/mL (ref 0.450–4.500)

## 2018-08-13 ENCOUNTER — Other Ambulatory Visit: Payer: Self-pay

## 2018-08-13 DIAGNOSIS — I4891 Unspecified atrial fibrillation: Secondary | ICD-10-CM

## 2018-08-14 ENCOUNTER — Telehealth: Payer: Self-pay | Admitting: Cardiovascular Disease

## 2018-08-14 ENCOUNTER — Ambulatory Visit (INDEPENDENT_AMBULATORY_CARE_PROVIDER_SITE_OTHER): Payer: Medicare Other | Admitting: Cardiovascular Disease

## 2018-08-14 ENCOUNTER — Encounter: Payer: Self-pay | Admitting: Cardiovascular Disease

## 2018-08-14 ENCOUNTER — Ambulatory Visit (INDEPENDENT_AMBULATORY_CARE_PROVIDER_SITE_OTHER): Payer: Medicare Other | Admitting: Pharmacist Clinician (PhC)/ Clinical Pharmacy Specialist

## 2018-08-14 ENCOUNTER — Other Ambulatory Visit: Payer: Self-pay

## 2018-08-14 DIAGNOSIS — Z8673 Personal history of transient ischemic attack (TIA), and cerebral infarction without residual deficits: Secondary | ICD-10-CM

## 2018-08-14 DIAGNOSIS — I4811 Longstanding persistent atrial fibrillation: Secondary | ICD-10-CM | POA: Diagnosis not present

## 2018-08-14 DIAGNOSIS — I4891 Unspecified atrial fibrillation: Secondary | ICD-10-CM

## 2018-08-14 DIAGNOSIS — E782 Mixed hyperlipidemia: Secondary | ICD-10-CM | POA: Diagnosis not present

## 2018-08-14 DIAGNOSIS — I10 Essential (primary) hypertension: Secondary | ICD-10-CM

## 2018-08-14 DIAGNOSIS — I428 Other cardiomyopathies: Secondary | ICD-10-CM

## 2018-08-14 DIAGNOSIS — Z7901 Long term (current) use of anticoagulants: Secondary | ICD-10-CM

## 2018-08-14 LAB — POCT INR: INR: 2 (ref 2.0–3.0)

## 2018-08-14 NOTE — Assessment & Plan Note (Signed)
History of essential hypertension with blood pressure measured today 117/73.  He is on high-dose carvedilol and ramipril

## 2018-08-14 NOTE — Assessment & Plan Note (Signed)
History of chronic A. fib rate controlled on Coumadin anticoagulation. 

## 2018-08-14 NOTE — Progress Notes (Signed)
08/14/2018 Eric Ross   [birth year]  [id number]  Primary Physician Denita Lung, MD Primary Cardiologist: Lorretta Harp MD [name], [place]  HPI:  Eric Ross is a 63 y.o.  thin appearing married Caucasian male father of 2, grandfather and 3 grandchildren who is accompanied by his wife [name] today. I last saw him  12/25/2017.[name] He has a history of nonischemic cardiomyopathy cath performed in 2003 that showed normal coronary arteries. He has an ejection fraction in the 45-50% range. He does have chronic A. Fib on Coumadin anticoagulation rate controlled. His other problems include hyperlipidemia on statin therapy and treated hypertension. He does get occasional chest burning and a Myoview stress test performed 05/30/11 which was normal. Since I saw him a year [place] is relatively asymptomatic. Unfortunately his mother-in-law who is 16 years old lives with them as phone and broken several bones. [name] has been taking care of her while she is living with them.  Since I saw him a year ago he was complaining of some chest pain, shortness of breath and dizziness.  A 2D echo revealed an EF of 45%.  I did refer him to Dr. Curt Bears who adjusted his carvedilol.  His symptoms of dizziness have somewhat improved although he still has some dyspnea on exertion and atypical chest pain.   Current Meds  Medication Sig  . carvedilol (COREG) 25 MG tablet Take 1.5 tablets (37.5 mg total) by mouth 2 (two) times daily.  [name] levothyroxine (SYNTHROID) 112 MCG tablet Take 1 tablet (112 mcg total) by mouth daily.  . pantoprazole (PROTONIX) 40 MG tablet Take 1 tablet (40 mg total) by mouth daily.  . ramipril (ALTACE) 2.5 MG capsule TAKE 1 CAPSULE BY MOUTH EVERY DAY  . simvastatin (ZOCOR) 20 MG tablet Take 1 tablet (20 mg total) by mouth daily.  . tadalafil (CIALIS) 20 MG tablet Take 1 tablet (20 mg total) by mouth daily as needed for erectile dysfunction.  [name] warfarin (COUMADIN) 5 MG tablet  TAKE 1/2 TO 1 TABLET BY MOUTH DAILY AS DIRECTED BY COUMADIN CLINIC.  . [DISCONTINUED] traMADol (ULTRAM) 50 MG tablet Take 1 tablet (50 mg total) by mouth every 8 (eight) hours as needed.     Allergies  Allergen Reactions  . Penicillins     REACTION: Paralyzed him @ 63 yrs old    Social History   Socioeconomic History  . Marital status: Married    Spouse name: Not on file  . Number of children: Not on file  . Years of education: Not on file  . Highest education level: Not on file  Occupational History  . Not on file  Social Needs  . Financial resource strain: Not on file  . Food insecurity    Worry: Not on file    Inability: Not on file  . Transportation needs    Medical: Not on file    Non-medical: Not on file  Tobacco Use  . Smoking status: Never Smoker  . Smokeless tobacco: Current User    Types: Chew  Substance and Sexual Activity  . Alcohol use: No  . Drug use: No  . Sexual activity: Yes  Lifestyle  . Physical activity    Days per week: Not on file    Minutes per session: Not on file  . Stress: Not on file  Relationships  . Social Herbalist on phone: Not on file    Gets together: Not on file  Attends religious service: Not on file    Active member of club or organization: Not on file    Attends meetings of clubs or organizations: Not on file    Relationship status: Not on file  . Intimate partner violence    Fear of current or ex partner: Not on file    Emotionally abused: Not on file    Physically abused: Not on file    Forced sexual activity: Not on file  Other Topics Concern  . Not on file  Social History Narrative  . Not on file     Review of Systems: General: negative for chills, fever, night sweats or weight changes.  Cardiovascular: negative for chest pain, dyspnea on exertion, edema, orthopnea, palpitations, paroxysmal nocturnal dyspnea or shortness of breath Dermatological: negative for rash Respiratory: negative for cough or  wheezing Urologic: negative for hematuria Abdominal: negative for nausea, vomiting, diarrhea, bright red blood per rectum, melena, or hematemesis Neurologic: negative for visual changes, syncope, or dizziness All other systems reviewed and are otherwise negative except as noted above.    Blood pressure 117/73, pulse 84, temperature 98.3 F (36.8 C), height 6\' 1"  (1.854 m), weight 183 lb (83 kg), SpO2 95 %.  General appearance: alert and no distress Neck: no adenopathy, no carotid bruit, no JVD, supple, symmetrical, trachea midline and thyroid not enlarged, symmetric, no tenderness/mass/nodules Lungs: clear to auscultation bilaterally Heart: irregularly irregular rhythm Extremities: extremities normal, atraumatic, no cyanosis or edema Pulses: 2+ and symmetric Skin: Skin color, texture, turgor normal. No rashes or lesions Neurologic: Alert and oriented X 3, normal strength and tone. Normal symmetric reflexes. Normal coordination and gait  EKG atrial fibrillation with a ventricular spots of 84.  Personally reviewed this EKG.  ASSESSMENT AND PLAN:   Essential hypertension History of essential hypertension with blood pressure measured today 117/73.  He is on high-dose carvedilol and ramipril  Atrial fibrillation History of chronic A. fib rate controlled on Coumadin anticoagulation.  Hyperlipidemia History of hyperlipidemia on statin therapy with lipid profile performed 08/08/2018 revealing total cholesterol 157, LDL of 90 and HDL 46.  Nonischemic cardiomyopathy ([place]) History of nonischemic cardiomyopathy with chronic catheterization performed by myself in 2003 revealing normal coronary arteries.  Recent 2D echo revealed an EF of 45% on 01/15/2018 with normal LV size.  He is on carvedilol and ramipril.      Lorretta Harp MD FACP,FACC,FAHA, [facility] 08/14/2018 3:31 PM

## 2018-08-14 NOTE — Patient Instructions (Signed)

## 2018-08-14 NOTE — Assessment & Plan Note (Signed)
History of nonischemic cardiomyopathy with chronic catheterization performed by myself in 2003 revealing normal coronary arteries.  Recent 2D echo revealed an EF of 45% on 01/15/2018 with normal LV size.  He is on carvedilol and ramipril.

## 2018-08-14 NOTE — Assessment & Plan Note (Signed)
History of hyperlipidemia on statin therapy with lipid profile performed 08/08/2018 revealing total cholesterol 157, LDL of 90 and HDL 46.

## 2018-08-14 NOTE — Telephone Encounter (Signed)
I called pt to confirm his appt for 08-14-18 with Dr Gwenlyn Found.       COVID-19 Pre-Screening Questions:   In the past 7 to 10 days have you had a cough,  shortness of breath, headache, congestion, fever (100 or greater) body aches, chills, sore throat, or sudden loss of taste or sense of smell? No  Have you been around anyone with known Covid 19.  Have you been around anyone who is awaiting Covid 19 test results in the past 7 to 10 days? no Have you been around anyone who has been exposed to Covid 19, or has mentioned symptoms of Covid 19 within the past 7 to 10 days? no If you have any concerns/questions about symptoms patients report during screening (either on the phone or at threshold). Contact the provider seeing the patient or DOD for further guidance.  If neither are available contact a member of the leadership team.

## 2018-08-14 NOTE — Telephone Encounter (Signed)
Spoke to patient's wife she stated husband has a appointment with Dr.Berry this afternoon she has to come with him to help him.Advised I will make Dr.Berry's RN aware.

## 2018-08-14 NOTE — Telephone Encounter (Signed)
New Message:    Pt is scheduled to see Dr Gwenlyn Found today. Pt's wife have to come in and assist pt with his visit because of his condition.

## 2018-09-11 ENCOUNTER — Other Ambulatory Visit: Payer: Self-pay | Admitting: Cardiovascular Disease

## 2018-09-22 ENCOUNTER — Telehealth: Payer: Self-pay

## 2018-09-22 NOTE — Telephone Encounter (Signed)
Called and lmomed overdue inr 

## 2018-09-23 ENCOUNTER — Other Ambulatory Visit: Payer: Self-pay | Admitting: Cardiovascular Disease

## 2018-09-23 DIAGNOSIS — Z7901 Long term (current) use of anticoagulants: Secondary | ICD-10-CM | POA: Diagnosis not present

## 2018-09-23 DIAGNOSIS — I482 Chronic atrial fibrillation, unspecified: Secondary | ICD-10-CM | POA: Diagnosis not present

## 2018-09-24 LAB — PROTIME-INR
INR: 3.5 — ABNORMAL HIGH
Prothrombin Time: 33.5 s — ABNORMAL HIGH (ref 9.0–11.5)

## 2018-09-25 ENCOUNTER — Ambulatory Visit (INDEPENDENT_AMBULATORY_CARE_PROVIDER_SITE_OTHER): Payer: Medicare Other | Admitting: Pharmacist Clinician (PhC)/ Clinical Pharmacy Specialist

## 2018-09-25 DIAGNOSIS — I4811 Longstanding persistent atrial fibrillation: Secondary | ICD-10-CM

## 2018-09-25 DIAGNOSIS — Z8673 Personal history of transient ischemic attack (TIA), and cerebral infarction without residual deficits: Secondary | ICD-10-CM

## 2018-09-25 DIAGNOSIS — Z7901 Long term (current) use of anticoagulants: Secondary | ICD-10-CM

## 2018-09-26 DIAGNOSIS — Z1211 Encounter for screening for malignant neoplasm of colon: Secondary | ICD-10-CM | POA: Diagnosis not present

## 2018-09-26 LAB — COLOGUARD: Cologuard: NEGATIVE

## 2018-09-26 LAB — HM COLONOSCOPY

## 2018-10-08 ENCOUNTER — Encounter: Payer: Self-pay | Admitting: Family Medicine

## 2018-10-09 NOTE — Progress Notes (Signed)
Pt was advised [facility] 

## 2018-10-16 ENCOUNTER — Other Ambulatory Visit: Payer: Self-pay | Admitting: Family Medicine

## 2018-10-16 DIAGNOSIS — I10 Essential (primary) hypertension: Secondary | ICD-10-CM

## 2018-10-17 ENCOUNTER — Other Ambulatory Visit: Payer: Self-pay | Admitting: Cardiovascular Disease

## 2018-10-17 DIAGNOSIS — I482 Chronic atrial fibrillation, unspecified: Secondary | ICD-10-CM | POA: Diagnosis not present

## 2018-10-17 DIAGNOSIS — Z7901 Long term (current) use of anticoagulants: Secondary | ICD-10-CM | POA: Diagnosis not present

## 2018-10-18 LAB — PROTIME-INR
INR: 2.3 — ABNORMAL HIGH
Prothrombin Time: 22.2 s — ABNORMAL HIGH (ref 9.0–11.5)

## 2018-10-21 ENCOUNTER — Ambulatory Visit (INDEPENDENT_AMBULATORY_CARE_PROVIDER_SITE_OTHER): Payer: Medicare Other | Admitting: Pharmacist Clinician (PhC)/ Clinical Pharmacy Specialist

## 2018-10-21 DIAGNOSIS — I4811 Longstanding persistent atrial fibrillation: Secondary | ICD-10-CM | POA: Diagnosis not present

## 2018-10-21 DIAGNOSIS — Z8673 Personal history of transient ischemic attack (TIA), and cerebral infarction without residual deficits: Secondary | ICD-10-CM

## 2018-10-21 DIAGNOSIS — Z7901 Long term (current) use of anticoagulants: Secondary | ICD-10-CM | POA: Diagnosis not present

## 2018-11-21 DIAGNOSIS — Z23 Encounter for immunization: Secondary | ICD-10-CM | POA: Diagnosis not present

## 2018-12-01 ENCOUNTER — Other Ambulatory Visit: Payer: Self-pay | Admitting: Cardiovascular Disease

## 2018-12-01 DIAGNOSIS — I482 Chronic atrial fibrillation, unspecified: Secondary | ICD-10-CM | POA: Diagnosis not present

## 2018-12-01 DIAGNOSIS — Z7901 Long term (current) use of anticoagulants: Secondary | ICD-10-CM | POA: Diagnosis not present

## 2018-12-01 LAB — POCT INR: INR: 3.8 — AB (ref 2.0–3.0)

## 2018-12-02 LAB — PROTIME-INR
INR: 3.8 — ABNORMAL HIGH
Prothrombin Time: 36.2 s — ABNORMAL HIGH (ref 9.0–11.5)

## 2018-12-06 ENCOUNTER — Other Ambulatory Visit: Payer: Self-pay | Admitting: Cardiovascular Disease

## 2018-12-10 ENCOUNTER — Ambulatory Visit (INDEPENDENT_AMBULATORY_CARE_PROVIDER_SITE_OTHER): Payer: Medicare Other | Admitting: Cardiovascular Disease

## 2018-12-10 DIAGNOSIS — I4811 Longstanding persistent atrial fibrillation: Secondary | ICD-10-CM | POA: Diagnosis not present

## 2018-12-10 DIAGNOSIS — Z7901 Long term (current) use of anticoagulants: Secondary | ICD-10-CM | POA: Diagnosis not present

## 2018-12-10 DIAGNOSIS — Z8673 Personal history of transient ischemic attack (TIA), and cerebral infarction without residual deficits: Secondary | ICD-10-CM

## 2018-12-29 ENCOUNTER — Other Ambulatory Visit: Payer: Self-pay | Admitting: Cardiovascular Disease

## 2018-12-29 DIAGNOSIS — I482 Chronic atrial fibrillation, unspecified: Secondary | ICD-10-CM | POA: Diagnosis not present

## 2018-12-29 DIAGNOSIS — Z7901 Long term (current) use of anticoagulants: Secondary | ICD-10-CM | POA: Diagnosis not present

## 2018-12-30 ENCOUNTER — Ambulatory Visit (INDEPENDENT_AMBULATORY_CARE_PROVIDER_SITE_OTHER): Payer: Medicare Other | Admitting: Pharmacist Clinician (PhC)/ Clinical Pharmacy Specialist

## 2018-12-30 ENCOUNTER — Telehealth: Payer: Self-pay | Admitting: Pharmacist Clinician (PhC)/ Clinical Pharmacy Specialist

## 2018-12-30 DIAGNOSIS — Z7901 Long term (current) use of anticoagulants: Secondary | ICD-10-CM

## 2018-12-30 DIAGNOSIS — I4811 Longstanding persistent atrial fibrillation: Secondary | ICD-10-CM

## 2018-12-30 DIAGNOSIS — Z8673 Personal history of transient ischemic attack (TIA), and cerebral infarction without residual deficits: Secondary | ICD-10-CM

## 2018-12-30 LAB — PROTIME-INR
INR: 1.6 — AB (ref <=1.1)
INR: 1.6 — ABNORMAL HIGH
Prothrombin Time: 16.5 s — ABNORMAL HIGH (ref 9.0–11.5)

## 2018-12-30 NOTE — Telephone Encounter (Signed)
Updated lab orders.

## 2019-01-04 ENCOUNTER — Other Ambulatory Visit: Payer: Self-pay | Admitting: Cardiovascular Disease

## 2019-01-13 ENCOUNTER — Other Ambulatory Visit: Payer: Self-pay | Admitting: Family Medicine

## 2019-01-13 DIAGNOSIS — K219 Gastro-esophageal reflux disease without esophagitis: Secondary | ICD-10-CM

## 2019-01-13 DIAGNOSIS — I10 Essential (primary) hypertension: Secondary | ICD-10-CM

## 2019-01-31 ENCOUNTER — Other Ambulatory Visit: Payer: Self-pay | Admitting: Cardiovascular Disease

## 2019-02-15 ENCOUNTER — Other Ambulatory Visit: Payer: Self-pay | Admitting: Cardiology

## 2019-02-25 ENCOUNTER — Other Ambulatory Visit: Payer: Self-pay | Admitting: Cardiovascular Disease

## 2019-04-13 ENCOUNTER — Other Ambulatory Visit: Payer: Self-pay | Admitting: Family Medicine

## 2019-04-13 DIAGNOSIS — K219 Gastro-esophageal reflux disease without esophagitis: Secondary | ICD-10-CM

## 2019-04-13 DIAGNOSIS — I10 Essential (primary) hypertension: Secondary | ICD-10-CM

## 2019-05-28 ENCOUNTER — Other Ambulatory Visit: Payer: Self-pay

## 2019-05-28 DIAGNOSIS — I4811 Longstanding persistent atrial fibrillation: Secondary | ICD-10-CM

## 2019-06-26 ENCOUNTER — Telehealth: Payer: Self-pay | Admitting: Pharmacist

## 2019-06-30 NOTE — Telephone Encounter (Signed)
lmomed the pt to complete labs at [facility] and pt advised that they will be denied a refill until they complete an inr check

## 2019-07-01 ENCOUNTER — Telehealth: Payer: Self-pay

## 2019-07-01 DIAGNOSIS — I4811 Longstanding persistent atrial fibrillation: Secondary | ICD-10-CM | POA: Diagnosis not present

## 2019-07-01 LAB — POCT INR: INR: 2.6 (ref 2.0–3.0)

## 2019-07-01 NOTE — Telephone Encounter (Signed)
lmomed to get coumadin labs done

## 2019-07-01 NOTE — Telephone Encounter (Signed)
Pt's wife called and stated that they are at [facility] to do inr pt but they needed order. I faxed orders to them. Pt voiced understanding.

## 2019-07-02 ENCOUNTER — Ambulatory Visit (INDEPENDENT_AMBULATORY_CARE_PROVIDER_SITE_OTHER): Payer: Medicare Other | Admitting: Cardiology

## 2019-07-02 DIAGNOSIS — I4811 Longstanding persistent atrial fibrillation: Secondary | ICD-10-CM | POA: Diagnosis not present

## 2019-07-02 DIAGNOSIS — Z7901 Long term (current) use of anticoagulants: Secondary | ICD-10-CM

## 2019-07-02 DIAGNOSIS — Z8673 Personal history of transient ischemic attack (TIA), and cerebral infarction without residual deficits: Secondary | ICD-10-CM | POA: Diagnosis not present

## 2019-07-06 ENCOUNTER — Other Ambulatory Visit: Payer: Self-pay | Admitting: Family Medicine

## 2019-07-06 DIAGNOSIS — I10 Essential (primary) hypertension: Secondary | ICD-10-CM

## 2019-07-06 DIAGNOSIS — K219 Gastro-esophageal reflux disease without esophagitis: Secondary | ICD-10-CM

## 2019-07-22 ENCOUNTER — Encounter: Payer: Self-pay | Admitting: Family Medicine

## 2019-07-22 ENCOUNTER — Other Ambulatory Visit: Payer: Self-pay

## 2019-07-22 ENCOUNTER — Ambulatory Visit (INDEPENDENT_AMBULATORY_CARE_PROVIDER_SITE_OTHER): Payer: Medicare Other | Admitting: Family Medicine

## 2019-07-22 VITALS — BP 118/86 | HR 87 | Temp 97.7°F | Wt 187.6 lb

## 2019-07-22 DIAGNOSIS — I482 Chronic atrial fibrillation, unspecified: Secondary | ICD-10-CM

## 2019-07-22 DIAGNOSIS — Z8673 Personal history of transient ischemic attack (TIA), and cerebral infarction without residual deficits: Secondary | ICD-10-CM | POA: Diagnosis not present

## 2019-07-22 DIAGNOSIS — M545 Low back pain: Secondary | ICD-10-CM

## 2019-07-22 DIAGNOSIS — I10 Essential (primary) hypertension: Secondary | ICD-10-CM | POA: Diagnosis not present

## 2019-07-22 DIAGNOSIS — G8929 Other chronic pain: Secondary | ICD-10-CM | POA: Diagnosis not present

## 2019-07-22 DIAGNOSIS — E039 Hypothyroidism, unspecified: Secondary | ICD-10-CM

## 2019-07-22 DIAGNOSIS — N529 Male erectile dysfunction, unspecified: Secondary | ICD-10-CM

## 2019-07-22 DIAGNOSIS — R4701 Aphasia: Secondary | ICD-10-CM | POA: Diagnosis not present

## 2019-07-22 DIAGNOSIS — E7800 Pure hypercholesterolemia, unspecified: Secondary | ICD-10-CM | POA: Diagnosis not present

## 2019-07-22 NOTE — Progress Notes (Signed)
   Subjective:    Patient ID: Eric Ross, male    DOB: [birth year], 64 y.o.   MRN: [id number]  HPI He is here for medication management visit.  He does have an underlying history of atrial fib as well as expressive aphasia from a CVA.  He continues on Coumadin and is having no difficulty with that.  He is followed regularly by cardiology.  He continues on Protonix for his reflux symptoms.  He is also taking Altace and Coreg as well as Synthroid.  Simvastatin is causing no difficulty.  He does use Cialis with good results.  His main complaint today is continued difficulty with pain.  This is mainly in his joints.  He has had his back as well as other joints injected but still complains of significant amount of pain and interfering with his ADLs.  He has tried Tylenol as well as ibuprofen and has mentioned has had injections including epidurals but still complains of significant amount of pain.   Review of Systems     Objective:   Physical Exam Alert and in no distress. Tympanic membranes and canals are normal. Pharyngeal area is normal. Neck is supple without adenopathy or thyromegaly. Cardiac exam shows a regular sinus rhythm without murmurs or gallops. Lungs are clear to auscultation. Elbows wrists and knees shows no palpable tenderness or effusion. DTRs are normal      Assessment & Plan:  Essential hypertension - Plan: CBC with Differential/Platelet, Comprehensive metabolic panel  Chronic atrial fibrillation (HCC)  History of CVA (cerebrovascular accident)  Chronic low back pain without sciatica, unspecified back pain laterality - Plan: Sedimentation rate, Ambulatory referral to Pain Clinic  Hypothyroidism, unspecified type - Plan: TSH  Pure hypercholesterolemia - Plan: Lipid panel  Erectile dysfunction, unspecified erectile dysfunction type  Expressive aphasia  He will continue on his present medication regimen and his wife will call me when they need refills.  I think it  is legitimate to have him seen by pain management for better pain control.  He has not responded to the injections, oral medications etc.  Apparently there is also a screw from the previous surgical procedure on his left knee that is starting to bother him but he apparently is not interested in having any done about at the present time.  Over 30 minutes spent discussing all these issues with him and his wife.

## 2019-07-23 LAB — LIPID PANEL
Chol/HDL Ratio: 4 ratio (ref 0.0–5.0)
Cholesterol, Total: 164 mg/dL (ref 100–199)
HDL: 41 mg/dL (ref >39)
LDL Chol Calc (NIH): 100 mg/dL — ABNORMAL HIGH (ref 0–99)
Triglycerides: 130 mg/dL (ref 0–149)
VLDL Cholesterol Cal: 23 mg/dL (ref 5–40)

## 2019-07-23 LAB — COMPREHENSIVE METABOLIC PANEL
ALT: 15 IU/L (ref 0–44)
AST: 19 IU/L (ref 0–40)
Albumin/Globulin Ratio: 2.3 — ABNORMAL HIGH (ref 1.2–2.2)
Albumin: 4.9 g/dL — ABNORMAL HIGH (ref 3.8–4.8)
Alkaline Phosphatase: 47 IU/L — ABNORMAL LOW (ref 48–121)
BUN/Creatinine Ratio: 13 (ref 10–24)
BUN: 14 mg/dL (ref 8–27)
Bilirubin Total: 0.4 mg/dL (ref 0.0–1.2)
CO2: 20 mmol/L (ref 20–29)
Calcium: 9.1 mg/dL (ref 8.6–10.2)
Chloride: 102 mmol/L (ref 96–106)
Creatinine, Ser: 1.06 mg/dL (ref 0.76–1.27)
GFR calc Af Amer: 85 mL/min/{1.73_m2} (ref >59)
GFR calc non Af Amer: 74 mL/min/{1.73_m2} (ref >59)
Globulin, Total: 2.1 g/dL (ref 1.5–4.5)
Glucose: 89 mg/dL (ref 65–99)
Potassium: 4.6 mmol/L (ref 3.5–5.2)
Sodium: 136 mmol/L (ref 134–144)
Total Protein: 7 g/dL (ref 6.0–8.5)

## 2019-07-23 LAB — SEDIMENTATION RATE: Sed Rate: 3 mm/hr (ref 0–30)

## 2019-07-23 LAB — CBC WITH DIFFERENTIAL/PLATELET
Basophils Absolute: 0.1 10*3/uL (ref 0.0–0.2)
Basos: 1 %
EOS (ABSOLUTE): 0.4 10*3/uL (ref 0.0–0.4)
Eos: 5 %
Hematocrit: 43.2 % (ref 37.5–51.0)
Hemoglobin: 14.3 g/dL (ref 13.0–17.7)
Immature Grans (Abs): 0 10*3/uL (ref 0.0–0.1)
Immature Granulocytes: 0 %
Lymphocytes Absolute: 1.6 10*3/uL (ref 0.7–3.1)
Lymphs: 22 %
MCH: 29.5 pg (ref 26.6–33.0)
MCHC: 33.1 g/dL (ref 31.5–35.7)
MCV: 89 fL (ref 79–97)
Monocytes Absolute: 0.5 10*3/uL (ref 0.1–0.9)
Monocytes: 7 %
Neutrophils Absolute: 4.7 10*3/uL (ref 1.4–7.0)
Neutrophils: 65 %
Platelets: 184 10*3/uL (ref 150–450)
RBC: 4.84 x10E6/uL (ref 4.14–5.80)
RDW: 12.8 % (ref 11.6–15.4)
WBC: 7.3 10*3/uL (ref 3.4–10.8)

## 2019-07-23 LAB — TSH: TSH: 1.08 u[IU]/mL (ref 0.450–4.500)

## 2019-07-27 ENCOUNTER — Other Ambulatory Visit: Payer: Self-pay | Admitting: Family Medicine

## 2019-07-27 DIAGNOSIS — E7800 Pure hypercholesterolemia, unspecified: Secondary | ICD-10-CM

## 2019-08-20 ENCOUNTER — Other Ambulatory Visit: Payer: Self-pay

## 2019-08-20 ENCOUNTER — Other Ambulatory Visit: Payer: Self-pay | Admitting: Cardiovascular Disease

## 2019-08-20 DIAGNOSIS — I4811 Longstanding persistent atrial fibrillation: Secondary | ICD-10-CM

## 2019-08-21 ENCOUNTER — Telehealth: Payer: Self-pay | Admitting: Pharmacist

## 2019-08-21 ENCOUNTER — Other Ambulatory Visit: Payer: Self-pay | Admitting: Cardiovascular Disease

## 2019-08-21 DIAGNOSIS — I4811 Longstanding persistent atrial fibrillation: Secondary | ICD-10-CM | POA: Diagnosis not present

## 2019-08-21 LAB — PROTIME-INR
INR: 1.9 — ABNORMAL HIGH
Prothrombin Time: 19.4 s — ABNORMAL HIGH (ref 9.0–11.5)

## 2019-08-21 LAB — POCT INR: INR: 1.9 — AB (ref 2.0–3.0)

## 2019-08-21 NOTE — Telephone Encounter (Signed)
INR order sent yesterday and completed. but was sent as a 1 time order.   Patient needs standing order send for [facility] labs (not [facility]).

## 2019-08-24 ENCOUNTER — Ambulatory Visit (INDEPENDENT_AMBULATORY_CARE_PROVIDER_SITE_OTHER): Payer: Medicare Other | Admitting: Cardiology

## 2019-08-24 DIAGNOSIS — Z8673 Personal history of transient ischemic attack (TIA), and cerebral infarction without residual deficits: Secondary | ICD-10-CM | POA: Diagnosis not present

## 2019-08-24 DIAGNOSIS — I4891 Unspecified atrial fibrillation: Secondary | ICD-10-CM

## 2019-08-24 DIAGNOSIS — Z7901 Long term (current) use of anticoagulants: Secondary | ICD-10-CM

## 2019-08-24 NOTE — Telephone Encounter (Signed)
Received a completed lab work for [facility]	[place] and created an anticoagulation encounter

## 2019-08-27 ENCOUNTER — Encounter (HOSPITAL_BASED_OUTPATIENT_CLINIC_OR_DEPARTMENT_OTHER): Payer: Self-pay

## 2019-08-27 ENCOUNTER — Other Ambulatory Visit: Payer: Self-pay

## 2019-08-27 ENCOUNTER — Emergency Department (INDEPENDENT_AMBULATORY_CARE_PROVIDER_SITE_OTHER)
Admission: EM | Admit: 2019-08-27 | Discharge: 2019-08-27 | Disposition: A | Discharge status: Nursing Home (Includes ICF) | Payer: Medicare Other | Source: Home / Self Care | Attending: Family Medicine | Admitting: Family Medicine

## 2019-08-27 ENCOUNTER — Emergency Department (HOSPITAL_BASED_OUTPATIENT_CLINIC_OR_DEPARTMENT_OTHER)
Admission: EM | Admit: 2019-08-27 | Discharge: 2019-08-27 | Disposition: A | Discharge status: Home / Self Care | Payer: Medicare Other | Attending: Emergency Medicine | Admitting: Emergency Medicine

## 2019-08-27 ENCOUNTER — Emergency Department (HOSPITAL_BASED_OUTPATIENT_CLINIC_OR_DEPARTMENT_OTHER): Payer: Medicare Other

## 2019-08-27 ENCOUNTER — Encounter: Payer: Self-pay | Admitting: Emergency Medicine

## 2019-08-27 DIAGNOSIS — R109 Unspecified abdominal pain: Secondary | ICD-10-CM | POA: Insufficient documentation

## 2019-08-27 DIAGNOSIS — R10814 Left lower quadrant abdominal tenderness: Secondary | ICD-10-CM | POA: Insufficient documentation

## 2019-08-27 DIAGNOSIS — E039 Hypothyroidism, unspecified: Secondary | ICD-10-CM | POA: Insufficient documentation

## 2019-08-27 DIAGNOSIS — Z79899 Other long term (current) drug therapy: Secondary | ICD-10-CM | POA: Diagnosis not present

## 2019-08-27 DIAGNOSIS — I10 Essential (primary) hypertension: Secondary | ICD-10-CM | POA: Diagnosis not present

## 2019-08-27 DIAGNOSIS — R1032 Left lower quadrant pain: Secondary | ICD-10-CM

## 2019-08-27 DIAGNOSIS — R112 Nausea with vomiting, unspecified: Secondary | ICD-10-CM

## 2019-08-27 DIAGNOSIS — I482 Chronic atrial fibrillation, unspecified: Secondary | ICD-10-CM

## 2019-08-27 DIAGNOSIS — R1110 Vomiting, unspecified: Secondary | ICD-10-CM | POA: Diagnosis not present

## 2019-08-27 LAB — POCT URINALYSIS DIP (MANUAL ENTRY)
Blood, UA: NEGATIVE
Glucose, UA: NEGATIVE mg/dL
Leukocytes, UA: NEGATIVE
Nitrite, UA: NEGATIVE
Protein Ur, POC: NEGATIVE mg/dL
Spec Grav, UA: >=1.03 — AB (ref 1.010–1.025)
Urobilinogen, UA: 0.2 E.U./dL
pH, UA: 5.5 (ref 5.0–8.0)

## 2019-08-27 LAB — COMPREHENSIVE METABOLIC PANEL
ALT: 17 U/L (ref 0–44)
AST: 21 U/L (ref 15–41)
Albumin: 4.4 g/dL (ref 3.5–5.0)
Alkaline Phosphatase: 36 U/L — ABNORMAL LOW (ref 38–126)
Anion gap: 11 (ref 5–15)
BUN: 22 mg/dL (ref 8–23)
CO2: 22 mmol/L (ref 22–32)
Calcium: 8.8 mg/dL — ABNORMAL LOW (ref 8.9–10.3)
Chloride: 103 mmol/L (ref 98–111)
Creatinine, Ser: 1.07 mg/dL (ref 0.61–1.24)
GFR calc Af Amer: >60 mL/min (ref >60)
GFR calc non Af Amer: >60 mL/min (ref >60)
Glucose, Bld: 120 mg/dL — ABNORMAL HIGH (ref 70–99)
Potassium: 4.5 mmol/L (ref 3.5–5.1)
Sodium: 136 mmol/L (ref 135–145)
Total Bilirubin: 1 mg/dL (ref 0.3–1.2)
Total Protein: 7.3 g/dL (ref 6.5–8.1)

## 2019-08-27 LAB — LIPASE, BLOOD: Lipase: 24 U/L (ref 11–51)

## 2019-08-27 LAB — CBC
HCT: 43.2 % (ref 39.0–52.0)
Hemoglobin: 14.3 g/dL (ref 13.0–17.0)
MCH: 29.2 pg (ref 26.0–34.0)
MCHC: 33.1 g/dL (ref 30.0–36.0)
MCV: 88.3 fL (ref 80.0–100.0)
Platelets: 149 10*3/uL — ABNORMAL LOW (ref 150–400)
RBC: 4.89 MIL/uL (ref 4.22–5.81)
RDW: 12.3 % (ref 11.5–15.5)
WBC: 10.2 10*3/uL (ref 4.0–10.5)
nRBC: 0 % (ref 0.0–0.2)

## 2019-08-27 MED ORDER — SODIUM CHLORIDE 0.9% FLUSH
3.0000 mL | Freq: Once | INTRAVENOUS | Status: DC
  Filled 2019-08-27: qty 3

## 2019-08-27 MED ORDER — DICYCLOMINE HCL 20 MG PO TABS
20.0000 mg | ORAL_TABLET | Freq: Two times a day (BID) | ORAL | 0 refills | Status: DC
Start: 2019-08-27 — End: 2021-05-30

## 2019-08-27 MED ORDER — IOHEXOL 300 MG/ML  SOLN
100.0000 mL | Freq: Once | INTRAMUSCULAR | Status: AC | PRN
  Administered 2019-08-27: 100 mL via INTRAVENOUS

## 2019-08-27 MED ORDER — MORPHINE SULFATE (PF) 4 MG/ML IV SOLN
4.0000 mg | Freq: Once | INTRAVENOUS | Status: AC
  Administered 2019-08-27: 4 mg via INTRAVENOUS
  Filled 2019-08-27: qty 1

## 2019-08-27 MED ORDER — ONDANSETRON 4 MG PO TBDP
2.0000 mg | ORAL_TABLET | Freq: Once | ORAL | Status: AC
  Administered 2019-08-27: 2 mg via ORAL

## 2019-08-27 NOTE — ED Provider Notes (Signed)
[facility] EMERGENCY DEPARTMENT Provider Note   CSN: [id number] Arrival date & time: 08/27/19  1151     History Chief Complaint  Patient presents with  . Abdominal Pain    Eric Ross is a 64 y.o. male with a past medical history of prior stroke, A. fib, currently on Coumadin presenting to the ED with a chief complaint of abdominal pain. Reports intermittent left lower quadrant abdominal pain for the past month that worsened this morning at 5 AM. Reports associated nonbloody, nonbilious emesis which prompted his visit to the urgent care. He also had one episode of nonbloody diarrhea while in the waiting room. He denies any diarrhea for the past month. No sick contacts with similar symptoms or suspicious food intake last night. No prior abdominal surgeries. Reports history of kidney stone in the past but does not know if this feels similar. He denies any urinary symptoms, fever, back pain, testicular pain, chest pain, shortness of breath, cough.  HPI     Past Medical History:  Diagnosis Date  . Atrial fibrillation ([place])    Dr. Gwenlyn Found  . Cardiomyopathy    nonischemic  . Erectile dysfunction   . History of thyroid cancer   . Hyperlipidemia   . Hypothyroidism   . Pseudogout   . Stroke [facility]) 2003    Patient Active Problem List   Diagnosis Date Noted  . Nonischemic cardiomyopathy ([place]) 02/05/2018  . Hemiparesis affecting right side as late effect of stroke ([place]) 02/15/2017  . Expressive aphasia 02/15/2017  . Erectile dysfunction 04/25/2016  . Hypothyroidism 04/25/2016  . Gastroesophageal reflux disease 04/25/2016  . Hyperlipidemia 11/03/2014  . Atrial fibrillation ([place]) 04/23/2012  . Long term current use of anticoagulant therapy 04/23/2012  . History of CVA (cerebrovascular accident) 04/23/2012  . Allergic rhinitis 12/14/2010  . Essential hypertension 07/05/2008    Past Surgical History:  Procedure Laterality Date  . CARDIAC CATHETERIZATION  09/22/01    NORMAL CORONARY ARTERIES  . JOINT REPLACEMENT     BILATERAL KNEE   . LEXISCAN MYOCARDIAL PERFUSION STUDY  05/30/11   NORMAL MYOCARDIAL PERFUSION STUDY EF% 49%.  . THYROIDECTOMY    . TRANSTHORACIC ECHOCARDIOGRAM  05/30/11   EF%=45-50%. NORMAL LV WALL THICKNESS. RV IS MILDLY DILATED. NO SIGNIFICANT VALVE DISEASE.        Family History  Problem Relation Age of Onset  . Thyroid cancer Mother   . Cancer Sister   . Stroke Brother     Social History   Tobacco Use  . Smoking status: Never Smoker  . Smokeless tobacco: Current User    Types: Chew  Substance Use Topics  . Alcohol use: No  . Drug use: No    Home Medications Prior to Admission medications   Medication Sig Start Date End Date Taking? Authorizing Provider  carvedilol (COREG) 25 MG tablet TAKE 1 AND 1/2 TABLETS BY MOUTH TWICE DAILY Patient taking differently: Take 37.5 mg by mouth in the morning and at bedtime.  02/17/19  Yes Camnitz, Ocie Doyne, MD  HYDROcodone-acetaminophen (NORCO/VICODIN) 5-325 MG tablet Take 1 tablet by mouth 4 (four) times daily as needed (pain).  07/30/19  Yes [provider]  levothyroxine (SYNTHROID) 112 MCG tablet Take 1 tablet (112 mcg total) by mouth daily. 08/08/18  Yes Denita Lung, MD  pantoprazole (PROTONIX) 40 MG tablet TAKE 1 TABLET BY MOUTH EVERY DAY Patient taking differently: Take 40 mg by mouth daily.  07/06/19  Yes Denita Lung, MD  ramipril (ALTACE) 2.5 MG  capsule TAKE 1 CAPSULE BY MOUTH EVERY DAY Patient taking differently: Take 2.5 mg by mouth daily.  07/06/19  Yes Denita Lung, MD  simvastatin (ZOCOR) 20 MG tablet TAKE 1 TABLET BY MOUTH EVERY DAY Patient taking differently: Take 20 mg by mouth daily.  07/27/19  Yes Denita Lung, MD  tadalafil (CIALIS) 20 MG tablet Take 1 tablet (20 mg total) by mouth daily as needed for erectile dysfunction. 08/08/18  Yes Denita Lung, MD  warfarin (COUMADIN) 5 MG tablet TAKE 1/2 TO 1 TABLET BY MOUTH DAILY AS DIRECTED BY COUMADIN  CLINIC Patient taking differently: Take 2.5-5 mg by mouth See admin instructions. Take 5mg  on mondays and fridays and 2.5mg  on all other days. 08/20/19  Yes Lorretta Harp, MD    Allergies    Penicillins  Review of Systems   Review of Systems  Constitutional: Negative for appetite change, chills and fever.  HENT: Negative for ear pain, rhinorrhea, sneezing and sore throat.   Eyes: Negative for photophobia and visual disturbance.  Respiratory: Negative for cough, chest tightness, shortness of breath and wheezing.   Cardiovascular: Negative for chest pain and palpitations.  Gastrointestinal: Positive for abdominal pain, diarrhea, nausea and vomiting. Negative for blood in stool and constipation.  Genitourinary: Negative for dysuria, hematuria and urgency.  Musculoskeletal: Negative for myalgias.  Skin: Negative for rash.  Neurological: Negative for dizziness, weakness and light-headedness.    Physical Exam Updated Vital Signs BP (!) 115/56 (BP Location: Right Arm)   Pulse 88   Temp 97.6 F (36.4 C) (Oral)   Resp 16   SpO2 96%   Physical Exam Vitals and nursing note reviewed.  Constitutional:      General: He is not in acute distress.    Appearance: He is well-developed.  HENT:     Head: Normocephalic and atraumatic.     Nose: Nose normal.  Eyes:     General: No scleral icterus.       Left eye: No discharge.     Conjunctiva/sclera: Conjunctivae normal.  Cardiovascular:     Rate and Rhythm: Normal rate and regular rhythm.     Heart sounds: Normal heart sounds. No murmur heard.  No friction rub. No gallop.   Pulmonary:     Effort: Pulmonary effort is normal. No respiratory distress.     Breath sounds: Normal breath sounds.  Abdominal:     General: Bowel sounds are normal. There is no distension.     Palpations: Abdomen is soft.     Tenderness: There is abdominal tenderness in the left lower quadrant. There is no guarding.  Musculoskeletal:        General: Normal  range of motion.     Cervical back: Normal range of motion and neck supple.  Skin:    General: Skin is warm and dry.     Findings: No rash.  Neurological:     Mental Status: He is alert.     Motor: No abnormal muscle tone.     Coordination: Coordination normal.     ED Results / Procedures / Treatments   Labs (all labs ordered are listed, but only abnormal results are displayed) Labs Reviewed  COMPREHENSIVE METABOLIC PANEL - Abnormal; Notable for the following components:      Result Value   Glucose, Bld 120 (*)    Calcium 8.8 (*)    Alkaline Phosphatase 36 (*)    All other components within normal limits  CBC - Abnormal; Notable for the  following components:   Platelets 149 (*)    All other components within normal limits  LIPASE, BLOOD    EKG None  Radiology CT ABDOMEN PELVIS W CONTRAST  Result Date: 08/27/2019 CLINICAL DATA:  Pain, nausea, vomiting, diverticulitis suspected EXAM: CT ABDOMEN AND PELVIS WITH CONTRAST TECHNIQUE: Multidetector CT imaging of the abdomen and pelvis was performed using the standard protocol following bolus administration of intravenous contrast. CONTRAST:  1100mL OMNIPAQUE IOHEXOL 300 MG/ML SOLN, additional oral enteric contrast COMPARISON:  None. FINDINGS: Lower chest: No acute abnormality.  Scarring of the right lung base. Hepatobiliary: No solid liver abnormality is seen. No gallstones, gallbladder wall thickening, or biliary dilatation. Pancreas: Unremarkable. No pancreatic ductal dilatation or surrounding inflammatory changes. Spleen: Normal in size without significant abnormality. Adrenals/Urinary Tract: Adrenal glands are unremarkable. Kidneys are normal, without renal calculi, solid lesion, or hydronephrosis. Bladder is unremarkable. Stomach/Bowel: Stomach is within normal limits. Appendix appears normal. No evidence of bowel wall thickening, distention, or inflammatory changes. Vascular/Lymphatic: No significant vascular findings are present. No  enlarged abdominal or pelvic lymph nodes. Reproductive: No mass or other significant abnormality. Other: No abdominal wall hernia or abnormality. No abdominopelvic ascites. Musculoskeletal: No acute or significant osseous findings. IMPRESSION: 1. No CT findings of the abdomen or pelvis to explain pain, nausea, or vomiting. No significant diverticular disease. 2. Normal appendix. Electronically Signed   By: Eddie Candle M.D.   On: 08/27/2019 17:57    Procedures Procedures (including critical care time)  Medications Ordered in ED Medications  sodium chloride flush (NS) 0.9 % injection 3 mL (3 mLs Intravenous Not Given 08/27/19 1322)  morphine 4 MG/ML injection 4 mg (4 mg Intravenous Given 08/27/19 1506)  iohexol (OMNIPAQUE) 300 MG/ML solution 100 mL (100 mLs Intravenous Contrast Given 08/27/19 1618)    ED Course  I have reviewed the triage vital signs and the nursing notes.  Pertinent labs & imaging results that were available during my care of the patient were reviewed by me and considered in my medical decision making (see chart for details).    MDM Rules/Calculators/A&P                          64 year old male with a past medical history of prior stroke, A. fib, currently on Coumadin presenting to the ED with a chief complaint of abdominal pain.  Intermittent left lower quadrant abdominal pain for the past month that worsened this morning.  Reports several episodes of emesis today, was given Zofran at urgent care and sent to the ER for imaging.  Had an episode of diarrhea while waiting in the waiting room but otherwise no changes to bowel movements.  Distant history of kidney stones in the past.  Denies urinary symptoms, fever or suspicious food intake.  No respiratory symptoms, no chest pain.  On exam there is some tenderness of the left lower quadrant without rebound or guarding.  No testicular pain or swelling noted.  Urinalysis without signs of infection.  CBC, CMP unremarkable.  Lipase is  normal.  CT of the abdomen pelvis without any acute findings or intra-abdominal surgical finding.  Suspect viral cause of his symptoms.  Patient symptoms controlled here.  He is requesting discharge home as he is hungry and wants to eat dinner.  Will provide symptomatic treatment and PCP follow-up.   Patient is hemodynamically stable, in NAD, and able to ambulate in the ED. Evaluation does not show pathology that would require ongoing emergent intervention  or inpatient treatment. I explained the diagnosis to the patient. Pain has been managed and has no complaints prior to discharge. Patient is comfortable with above plan and is stable for discharge at this time. All questions were answered prior to disposition. Strict return precautions for returning to the ED were discussed. Encouraged follow up with PCP.   An After Visit Summary was printed and given to the patient.   Portions of this note were generated with [employer]. Dictation errors may occur despite best attempts at proofreading.  Final Clinical Impression(s) / ED Diagnoses Final diagnoses:  Abdominal wall pain    Rx / DC Orders ED Discharge Orders    None       Delia Heady, PA-C 08/27/19 1809    Wyvonnia Dusky, MD 08/27/19 [phone]

## 2019-08-27 NOTE — ED Triage Notes (Signed)
Patient here with wife; he has some speech problems due to cva 2003; brought to triage room in wheelchair due to some unsteadiness. Reports vomiting starting around 0500; no diarrhea; no other family member ill; points to pain in lower left quadrant; denies dysuria but does have history renal calculi x 1; also has history afib. Has not had covid vaccination; personal choice. Dr. Assunta Found aware of triage process.

## 2019-08-27 NOTE — Discharge Instructions (Signed)
Take Tylenol as needed to help with pain.  You can also take medication such as Pepcid. Follow-up with your primary care provider. Return to the ER for worsening pain, if you develop a fever, bloody stools, uncontrollable vomiting, chest pain or shortness of breath.

## 2019-08-27 NOTE — ED Triage Notes (Signed)
Per wife pt with left side abd pain x 1 month-n/v since 5am-pt was given zofran at [facility]-pt states she speaks for pt due to speech difficulties r/t to stroke-to triage in w/c

## 2019-08-27 NOTE — ED Provider Notes (Signed)
[facility] C[facility]    CSN: [id number] Arrival date & time: 08/27/19  1028      History   Chief Complaint Chief Complaint  Patient presents with  . Emesis  . Abdominal Pain  . Chest Pain    HPI Eric Ross is a 64 y.o. male.   This patient with a history of chronic atrial fibrillation, CVA, chronic anticoagulation, and hypertension awoke this morning with nausea/vomiting and increased fatigue.  He states that he has had chills and a cough for several days.  He complains of left lower quadrant and left flank pain that has been present for at least a month and becoming worse.  He denies chest pain, shortness of breath, and new neurologic symptoms.  The history is provided by the spouse.  Emesis Severity:  Moderate Timing:  Intermittent Quality:  Stomach contents Progression:  Unchanged Chronicity:  New Recent urination:  Decreased Relieved by:  None tried Worsened by:  Nothing Ineffective treatments:  None tried Associated symptoms: abdominal pain, chills and cough   Associated symptoms: no fever   Abdominal Pain Associated symptoms: chest pain, chills, cough, fatigue, nausea and vomiting   Associated symptoms: no constipation, no fever and no shortness of breath   Chest Pain Associated symptoms: abdominal pain, cough, fatigue, nausea and vomiting   Associated symptoms: no fever, no palpitations and no shortness of breath     Past Medical History:  Diagnosis Date  . Atrial fibrillation ([place])    Dr. Gwenlyn Found  . Cardiomyopathy    nonischemic  . Erectile dysfunction   . History of thyroid cancer   . Hyperlipidemia   . Hypothyroidism   . Pseudogout   . Stroke [facility]) 2003    Patient Active Problem List   Diagnosis Date Noted  . Nonischemic cardiomyopathy ([place]) 02/05/2018  . Hemiparesis affecting right side as late effect of stroke ([place]) 02/15/2017  . Expressive aphasia 02/15/2017  . Erectile dysfunction 04/25/2016  . Hypothyroidism 04/25/2016  .  Gastroesophageal reflux disease 04/25/2016  . Hyperlipidemia 11/03/2014  . Atrial fibrillation ([place]) 04/23/2012  . Long term current use of anticoagulant therapy 04/23/2012  . History of CVA (cerebrovascular accident) 04/23/2012  . Allergic rhinitis 12/14/2010  . Essential hypertension 07/05/2008    Past Surgical History:  Procedure Laterality Date  . CARDIAC CATHETERIZATION  09/22/01   NORMAL CORONARY ARTERIES  . JOINT REPLACEMENT     BILATERAL KNEE   . LEXISCAN MYOCARDIAL PERFUSION STUDY  05/30/11   NORMAL MYOCARDIAL PERFUSION STUDY EF% 49%.  . THYROIDECTOMY    . TRANSTHORACIC ECHOCARDIOGRAM  05/30/11   EF%=45-50%. NORMAL LV WALL THICKNESS. RV IS MILDLY DILATED. NO SIGNIFICANT VALVE DISEASE.        Home Medications    Prior to Admission medications   Medication Sig Start Date End Date Taking? Authorizing Provider  oxyCODONE-acetaminophen (PERCOCET) 10-325 MG tablet Take 1 tablet by mouth every 6 (six) hours as needed for pain.   Yes [provider]  warfarin (COUMADIN) 5 MG tablet TAKE 1/2 TO 1 TABLET BY MOUTH DAILY AS DIRECTED BY COUMADIN CLINIC 08/20/19   Lorretta Harp, MD  carvedilol (COREG) 25 MG tablet TAKE 1 AND 1/2 TABLETS BY MOUTH TWICE DAILY 02/17/19   Camnitz, Ocie Doyne, MD  levothyroxine (SYNTHROID) 112 MCG tablet Take 1 tablet (112 mcg total) by mouth daily. 08/08/18   Denita Lung, MD  pantoprazole (PROTONIX) 40 MG tablet TAKE 1 TABLET BY MOUTH EVERY DAY 07/06/19   Denita Lung, MD  ramipril (ALTACE) 2.5 MG capsule TAKE 1 CAPSULE BY MOUTH EVERY DAY 07/06/19   Denita Lung, MD  simvastatin (ZOCOR) 20 MG tablet TAKE 1 TABLET BY MOUTH EVERY DAY 07/27/19   Denita Lung, MD  tadalafil (CIALIS) 20 MG tablet Take 1 tablet (20 mg total) by mouth daily as needed for erectile dysfunction. 08/08/18   Denita Lung, MD    Family History Family History  Problem Relation Age of Onset  . Thyroid cancer Mother   . Cancer Sister   . Stroke Brother      Social History Social History   Tobacco Use  . Smoking status: Never Smoker  . Smokeless tobacco: Current User    Types: Chew  Substance Use Topics  . Alcohol use: No  . Drug use: No     Allergies   Penicillins   Review of Systems Review of Systems  Constitutional: Positive for activity change, appetite change, chills and fatigue. Negative for fever.  HENT: Negative.   Eyes: Negative.   Respiratory: Positive for cough. Negative for chest tightness, shortness of breath and wheezing.   Cardiovascular: Positive for chest pain. Negative for palpitations.  Gastrointestinal: Positive for abdominal pain, nausea and vomiting. Negative for blood in stool and constipation.  Genitourinary: Positive for decreased urine volume.  Musculoskeletal: Negative.   Skin: Negative.   Neurological: Positive for light-headedness.     Physical Exam Triage Vital Signs ED Triage Vitals  Enc Vitals Group     BP 08/27/19 1054 114/84     Pulse Rate 08/27/19 1054 100     Resp 08/27/19 1054 18     Temp 08/27/19 1054 97.6 F (36.4 C)     Temp Source 08/27/19 1054 Oral     SpO2 08/27/19 1054 96 %     Weight 08/27/19 1056 186 lb (84.4 kg)     Height 08/27/19 1056 6\' 1"  (1.854 m)     Head Circumference --      Peak Flow --      Pain Score 08/27/19 1055 7     Pain Loc --      Pain Edu? --      Excl. in [place]? --    No data found.  Updated Vital Signs BP 114/84 (BP Location: Left Arm)   Pulse 100   Temp 97.6 F (36.4 C) (Oral)   Resp 18   Ht 6\' 1"  (1.854 m)   Wt 84.4 kg   SpO2 96%   BMI 24.54 kg/m   Visual Acuity Right Eye Distance:   Left Eye Distance:   Bilateral Distance:    Right Eye Near:   Left Eye Near:    Bilateral Near:     Physical Exam Vitals and nursing note reviewed.  Constitutional:      General: He is not in acute distress.    Comments: Patient appears uncomfortable  HENT:     Head: Normocephalic.     Mouth/Throat:     Mouth: Mucous membranes are moist.   Eyes:     Extraocular Movements: Extraocular movements intact.  Cardiovascular:     Rate and Rhythm: Tachycardia present. Rhythm irregular.     Heart sounds: Normal heart sounds.  Pulmonary:     Breath sounds: Normal breath sounds.  Abdominal:       Comments: Left lower quadrant and left flank tenderness to palpation.  Skin:    General: Skin is warm and dry.  Neurological:     Mental Status: He is  alert and oriented to person, place, and time.     Comments: Speech limited; past history of CVA      UC Treatments / Results  Labs (all labs ordered are listed, but only abnormal results are displayed) Labs Reviewed  POCT URINALYSIS DIP (MANUAL ENTRY) - Abnormal; Notable for the following components:      Result Value   Color, UA light yellow (*)    Bilirubin, UA small (*)    Ketones, POC UA trace (5) (*)    Spec Grav, UA >=1.030 (*)    All other components within normal limits    EKG  Rate:  118 BPM PR:   msec QT:  340 msec QTcH:  476 msec QRSD:  84 msec QRS axis:  10 degrees Interpretation:   Atrial fibrillation with rapid ventricular rate  Radiology No results found.  Procedures Procedures (including critical care time)  Medications Ordered in UC Medications  ondansetron (ZOFRAN-ODT) disintegrating tablet 2 mg (2 mg Oral Given 08/27/19 1048)    Initial Impression / Assessment and Plan / UC Course  I have reviewed the triage vital signs and the nursing notes.  Pertinent labs & imaging results that were available during my care of the patient were reviewed by me and considered in my medical decision making (see chart for details).     Administered Zofran ODT 4mg  PO with decrease in patient's nausea. EKG shows no acute changes other than increased rate and chronic atrial fib. Left lower abdominal and flank pain and new onset nausea/vomiting needs further evaluation. Because of multiple morbidities, recommend evaluation in [facility] ED.  Patient's  vital signs stable.   Final Clinical Impressions(s) / UC Diagnoses   Final diagnoses:  Abdominal pain, left lower quadrant  Non-intractable vomiting with nausea, unspecified vomiting type  Chronic atrial fibrillation [facility])   Discharge Instructions   None    ED Prescriptions    None        Kandra Nicolas, MD 08/27/19 1144

## 2019-08-31 DIAGNOSIS — Z03818 Encounter for observation for suspected exposure to other biological agents ruled out: Secondary | ICD-10-CM | POA: Diagnosis not present

## 2019-08-31 DIAGNOSIS — Z20822 Contact with and (suspected) exposure to covid-19: Secondary | ICD-10-CM | POA: Diagnosis not present

## 2019-09-15 DIAGNOSIS — Z79899 Other long term (current) drug therapy: Secondary | ICD-10-CM | POA: Diagnosis not present

## 2019-09-15 DIAGNOSIS — M5136 Other intervertebral disc degeneration, lumbar region: Secondary | ICD-10-CM | POA: Diagnosis not present

## 2019-09-15 DIAGNOSIS — G90519 Complex regional pain syndrome I of unspecified upper limb: Secondary | ICD-10-CM | POA: Diagnosis not present

## 2019-09-15 DIAGNOSIS — M25532 Pain in left wrist: Secondary | ICD-10-CM | POA: Diagnosis not present

## 2019-09-15 DIAGNOSIS — G894 Chronic pain syndrome: Secondary | ICD-10-CM | POA: Diagnosis not present

## 2019-09-15 DIAGNOSIS — Z79891 Long term (current) use of opiate analgesic: Secondary | ICD-10-CM | POA: Diagnosis not present

## 2019-09-16 ENCOUNTER — Telehealth: Payer: Self-pay | Admitting: Cardiovascular Disease

## 2019-09-16 NOTE — Telephone Encounter (Signed)
Patient with diagnosis of afib on warfarin for anticoagulation.    Procedure: stellate ganglion injections Date of procedure: TBD  CHADS2-VASc score of 4 (cardiomyopathy with EF of 45%, HTN, CVA)  CrCl 12mL/min Platelet count 149K  Pt cannot stop his anticoagulation for multiple weeks. He is at high CV risk off of anticoag due to history of afib and stroke. Aspirin is not an effective substitution.   Could consider changing to [place] therapy, however this would still need to be held for 3 days prior to spinal procedure and also should not be held for multiple weeks (looks as though cost of DOAC may be an issue - Lovenox cost likely would be as well then). Unsure of feasibility of injections if requirement is to be off of any blood thinner for multiple weeks. Will route to MD for any additional input.

## 2019-09-16 NOTE — Telephone Encounter (Signed)
   [facility] Pre-operative Risk Assessment   Request for surgical clearance:  1. What type of surgery is being performed? Stellate Ganglion injections   2. When is this surgery scheduled? TBD   3. What type of clearance is required (medical clearance vs. Pharmacy clearance to hold med vs. Both)? Pharmacy   4. Are there any medications that need to be held prior to surgery and how long? Warfarin - 5 days prior however note says "We are wanting to do Stellate Ganglion injections which would require him to stop his blood thinner for several weeks. Can he stop and take ASA instead?"  5. Practice name and name of physician performing surgery? Dian Situ MD with [facility]  6. What is the office phone number? [phone]   7.   What is the office fax number? [phone]  8.   Anesthesia type (None, local, MAC, general) ? Not specified    Eric Ross 09/16/2019, 11:20 AM  _________________________________________________________________   (provider comments below)

## 2019-09-16 NOTE — Telephone Encounter (Signed)
LMOM for wife to return call.  Need to determine frequency and number of injections.  Pt currently on [employer], but will go on Medicare as of Dec 1 this year.

## 2019-09-17 NOTE — Telephone Encounter (Signed)
Left a voice message for the clinic nurse ar the requesting office of [facility] to give our office a call and ask for the pre op pool to get the updated information involving the request for holding the medication.

## 2019-09-17 NOTE — Telephone Encounter (Signed)
Agree with everything mentioned. Pt on coumadin currently, DOAC cost prohib for him. Spinal injections would be dangerous with him being off of antincoag especially in light of prior CVA. Agree would wait until he goes on Medicare and can potentially afford a DOAC

## 2019-09-17 NOTE — Telephone Encounter (Signed)
[name] from [facility] returned my call and I informed her of the recommendations about the medications she thanked me for the call and will call back in December or resend the clearance

## 2019-09-17 NOTE — Telephone Encounter (Signed)
Please notify Dr. Vira Blanco of Dr. Kennon Holter and Pharmacy's below recommendations - Spinal injections would be dangerous with him being off anticoagulation especially in light of prior CVA. Currently on Coumadin because of the cost of DOACs. Lovenox would also likely be too  expensive as well. Dr. Gwenlyn Found and Pharmacy recommend waiting until he goes on Medicare in December at which time he may be able to afford DOAC.  Thank you!

## 2019-09-27 ENCOUNTER — Other Ambulatory Visit: Payer: Self-pay | Admitting: Family Medicine

## 2019-09-27 DIAGNOSIS — I10 Essential (primary) hypertension: Secondary | ICD-10-CM

## 2019-09-27 DIAGNOSIS — K219 Gastro-esophageal reflux disease without esophagitis: Secondary | ICD-10-CM

## 2019-09-29 DIAGNOSIS — M47817 Spondylosis without myelopathy or radiculopathy, lumbosacral region: Secondary | ICD-10-CM | POA: Diagnosis not present

## 2019-09-30 ENCOUNTER — Telehealth: Payer: Self-pay | Admitting: Family Medicine

## 2019-09-30 ENCOUNTER — Other Ambulatory Visit: Payer: Self-pay | Admitting: Family Medicine

## 2019-09-30 DIAGNOSIS — E039 Hypothyroidism, unspecified: Secondary | ICD-10-CM

## 2019-09-30 MED ORDER — LEVOTHYROXINE SODIUM 112 MCG PO TABS: 112.0000 ug | ORAL_TABLET | Freq: Every day | ORAL | 0 refills | Status: DC

## 2019-09-30 NOTE — Telephone Encounter (Signed)
Sent in

## 2019-09-30 NOTE — Telephone Encounter (Signed)
Has appt in october

## 2019-09-30 NOTE — Telephone Encounter (Signed)
[facility] per insurance for Levothyronxine 112 mcg

## 2019-10-07 ENCOUNTER — Telehealth: Payer: Self-pay

## 2019-10-07 NOTE — Telephone Encounter (Signed)
Called and spoke w/pt's wife stated that they were overdue for inr check and that they needed to complete it asap. Pt's wife voiced understanding

## 2019-10-13 ENCOUNTER — Telehealth: Payer: Self-pay

## 2019-10-13 DIAGNOSIS — M47817 Spondylosis without myelopathy or radiculopathy, lumbosacral region: Secondary | ICD-10-CM | POA: Diagnosis not present

## 2019-10-13 NOTE — Telephone Encounter (Signed)
lmom for overdue inr 

## 2019-10-21 ENCOUNTER — Other Ambulatory Visit: Payer: Self-pay

## 2019-10-21 DIAGNOSIS — Z7901 Long term (current) use of anticoagulants: Secondary | ICD-10-CM

## 2019-10-21 DIAGNOSIS — I4811 Longstanding persistent atrial fibrillation: Secondary | ICD-10-CM

## 2019-10-26 ENCOUNTER — Telehealth: Payer: Self-pay | Admitting: Cardiovascular Disease

## 2019-10-26 ENCOUNTER — Other Ambulatory Visit: Payer: Self-pay

## 2019-10-26 DIAGNOSIS — I4811 Longstanding persistent atrial fibrillation: Secondary | ICD-10-CM

## 2019-10-26 DIAGNOSIS — Z7901 Long term (current) use of anticoagulants: Secondary | ICD-10-CM

## 2019-10-26 NOTE — Telephone Encounter (Signed)
Brandy from [employer] requesting new order for PTINR since the one he has is expired. Fax: [phone]

## 2019-10-26 NOTE — Telephone Encounter (Signed)
Eric Ross in the anticoagulation clinic is handling the new order as requested. Will close this encounter.

## 2019-10-27 DIAGNOSIS — M47817 Spondylosis without myelopathy or radiculopathy, lumbosacral region: Secondary | ICD-10-CM | POA: Diagnosis not present

## 2019-10-30 ENCOUNTER — Ambulatory Visit (INDEPENDENT_AMBULATORY_CARE_PROVIDER_SITE_OTHER): Payer: Medicare Other | Admitting: Family Medicine

## 2019-10-30 ENCOUNTER — Encounter: Payer: Self-pay | Admitting: Family Medicine

## 2019-10-30 ENCOUNTER — Other Ambulatory Visit: Payer: Self-pay

## 2019-10-30 VITALS — BP 120/84 | HR 78 | Temp 96.9°F | Ht 73.0 in | Wt 183.0 lb

## 2019-10-30 DIAGNOSIS — G8929 Other chronic pain: Secondary | ICD-10-CM | POA: Insufficient documentation

## 2019-10-30 DIAGNOSIS — Z8673 Personal history of transient ischemic attack (TIA), and cerebral infarction without residual deficits: Secondary | ICD-10-CM | POA: Diagnosis not present

## 2019-10-30 DIAGNOSIS — I4811 Longstanding persistent atrial fibrillation: Secondary | ICD-10-CM

## 2019-10-30 DIAGNOSIS — M545 Low back pain, unspecified: Secondary | ICD-10-CM | POA: Diagnosis not present

## 2019-10-30 DIAGNOSIS — R4701 Aphasia: Secondary | ICD-10-CM

## 2019-10-30 DIAGNOSIS — K219 Gastro-esophageal reflux disease without esophagitis: Secondary | ICD-10-CM

## 2019-10-30 DIAGNOSIS — E782 Mixed hyperlipidemia: Secondary | ICD-10-CM | POA: Diagnosis not present

## 2019-10-30 DIAGNOSIS — I428 Other cardiomyopathies: Secondary | ICD-10-CM

## 2019-10-30 DIAGNOSIS — N529 Male erectile dysfunction, unspecified: Secondary | ICD-10-CM

## 2019-10-30 DIAGNOSIS — Z7901 Long term (current) use of anticoagulants: Secondary | ICD-10-CM

## 2019-10-30 DIAGNOSIS — E039 Hypothyroidism, unspecified: Secondary | ICD-10-CM | POA: Diagnosis not present

## 2019-10-30 DIAGNOSIS — I69351 Hemiplegia and hemiparesis following cerebral infarction affecting right dominant side: Secondary | ICD-10-CM

## 2019-10-30 MED ORDER — PANTOPRAZOLE SODIUM 40 MG PO TBEC: 40.0000 mg | DELAYED_RELEASE_TABLET | Freq: Every day | ORAL | 3 refills | Status: DC

## 2019-10-30 MED ORDER — TADALAFIL 20 MG PO TABS: 20.0000 mg | ORAL_TABLET | Freq: Every day | ORAL | 5 refills | Status: DC | PRN

## 2019-10-30 NOTE — Progress Notes (Signed)
Eric Ross is a 64 y.o. male who presents for annual wellness visit and follow-up on chronic medical conditions.  He has no particular concerns or complaints.  He would like an INR done.  He has a history of atrial fibrillation with previous expressive aphasia and CVA.  He has been taking Coumadin for a long time.  Continues on simvastatin for his lipids and is also taking ramipril and Coreg for his blood pressure.  Continues on Protonix for reflux symptoms.  He is taking Synthroid and having no difficulty with that.  He would like a refill on his Eric Ross.  He is not interested in having any immunizations.  He continues to go to the pain clinic for his chronic back pain.  He has had colon cancer screening.   Immunizations and Health Maintenance Immunization History  Administered Date(s) Administered  . Influenza Inj Mdck Quad Pf 10/19/2016, 10/09/2017  . Influenza Split 10/04/2014  . Influenza, Seasonal, Injecte, Preservative Fre 10/09/2017  . Influenza,inj,Quad PF,6+ Mos 11/21/2018  . Influenza-Unspecified 10/22/2015  . Td 04/26/2006   Health Maintenance Due  Topic Date Due  . COVID-19 Vaccine (1) Never done  . TETANUS/TDAP  04/25/2016  . INFLUENZA VACCINE  08/30/2019    Last colonoscopy: 09/26/18 Last PSA: unknown Dentist: over two years Ophtho: over two years Exercise: N/A  Other doctors caring for patient include: Dr. Gwenlyn Ross cardio, Pain clinic  Advanced Directives: Does Patient Have a Medical Advance Directive?: No Would patient like information on creating a medical advance directive?: Yes (MAU/Ambulatory/Procedural Areas - Information given)  Depression screen:  See questionnaire below.     Depression screen [facility] 2/9 10/30/2019 07/22/2019 08/08/2018 11/27/2017 02/15/2017  Decreased Interest 0 0 0 0 0  Down, Depressed, Hopeless 0 0 0 0 0  PHQ - 2 Score 0 0 0 0 0    Fall Screen: See Questionaire below.   Fall Risk  10/30/2019 08/08/2018 11/27/2017  Falls in the past year?  1 1 No  Number falls in past yr: 1 1 -  Injury with Fall? 0 1 -  Risk for fall due to : Impaired balance/gait - -    ADL screen:  See questionnaire below.  Functional Status Survey: Is the patient deaf or have difficulty hearing?: No Does the patient have difficulty seeing, even when wearing glasses/contacts?: No Does the patient have difficulty concentrating, remembering, or making decisions?: No Does the patient have difficulty walking or climbing stairs?: Yes Does the patient have difficulty dressing or bathing?: No Does the patient have difficulty doing errands alone such as visiting a doctor's office or shopping?: No   Review of Systems  Constitutional: -, -unexpected weight change, -anorexia, -fatigue Allergy: -sneezing, -itching, -congestion Dermatology: denies changing moles, rash, lumps ENT: -runny nose, -ear pain, -sore throat,  Cardiology:  -chest pain, -palpitations, -orthopnea, Respiratory: -cough, -shortness of breath, -dyspnea on exertion, -wheezing,  Gastroenterology: -abdominal pain, -nausea, -vomiting, -diarrhea, -constipation, -dysphagia Hematology: -bleeding or bruising problems Musculoskeletal: -arthralgias, -myalgias, -joint swelling, -back pain, - Ophthalmology: -vision changes,  Urology: -dysuria, -difficulty urinating,  -urinary frequency, -urgency, incontinence Neurology: -, -numbness, , -memory loss, -falls, -dizziness    PHYSICAL EXAM:   General Appearance: Alert, cooperative, no distress, appears stated age Head: Normocephalic, without obvious abnormality, atraumatic Eyes: PERRL, conjunctiva/corneas clear, EOM's intact, Ears: Normal TM's and external ear canals Nose: Nares normal, mucosa normal, no drainage or sinus   tenderness Throat: Lips, mucosa, and tongue normal; teeth and gums normal Neck: Supple, no lymphadenopathy, thyroid:no enlargement/tenderness/nodules; no  carotid bruit or JVD Lungs: Clear to auscultation bilaterally without  wheezes, rales or ronchi; respirations unlabored Heart: Irregular rhythm noted S1 and S2 normal, no murmur, rub or gallop  Extremities: No clubbing, cyanosis or edema Pulses: 2+ and symmetric all extremities Skin: Skin color, texture, turgor normal, no rashes or lesions Lymph nodes: Cervical, supraclavicular, and axillary nodes normal Neurologic: CNII-XII intact, normal strength, sensation and gait; reflexes 2+ and symmetric throughout aphasia speech pattern noted. Psych: Normal mood, affect, hygiene and grooming  ASSESSMENT/PLAN: History of CVA (cerebrovascular accident)  Chronic low back pain without sciatica, unspecified back pain laterality  Nonischemic cardiomyopathy (Eric Ross) - Plan: CBC with Differential/Platelet, Comprehensive metabolic panel  Expressive aphasia  Hemiparesis affecting right side as late effect of stroke (Eric Ross) - Plan: CBC with Differential/Platelet, Comprehensive metabolic panel  Gastroesophageal reflux disease, unspecified whether esophagitis present  Hypothyroidism, unspecified type - Plan: TSH  Mixed hyperlipidemia - Plan: Lipid panel  Long term current use of anticoagulant therapy - Plan: Protime-INR  Longstanding persistent atrial fibrillation (Eric Ross) - Plan: Protime-INR  Erectile dysfunction, unspecified erectile dysfunction type - Plan: tadalafil (Eric Ross) 20 MG tablet  Gastroesophageal reflux disease - Plan: pantoprazole (PROTONIX) 40 MG tablet     Immunization recommendations discussed.  He refuses all immunizations.  Colonoscopy recommendations reviewed.  Repeat Cologuard in 2 years.   Medicare Attestation I have personally reviewed: The patient's medical and social history Their use of alcohol, tobacco or illicit drugs Their current medications and supplements The patient's functional ability including ADLs,fall risks, home safety risks, cognitive, and hearing and visual impairment Diet and physical activities Evidence for depression or mood  disorders  The patient's weight, height, and BMI have been recorded in the chart.  I have made referrals, counseling, and provided education to the patient based on review of the above and I have provided the patient with a written personalized care plan for preventive services.     Jill Alexanders, MD   10/30/2019

## 2019-10-31 LAB — CBC WITH DIFFERENTIAL/PLATELET
Basophils Absolute: 0.1 10*3/uL (ref 0.0–0.2)
Basos: 1 %
EOS (ABSOLUTE): 0.2 10*3/uL (ref 0.0–0.4)
Eos: 4 %
Hematocrit: 38.8 % (ref 37.5–51.0)
Hemoglobin: 13.2 g/dL (ref 13.0–17.7)
Immature Grans (Abs): 0 10*3/uL (ref 0.0–0.1)
Immature Granulocytes: 0 %
Lymphocytes Absolute: 1.7 10*3/uL (ref 0.7–3.1)
Lymphs: 25 %
MCH: 28.9 pg (ref 26.6–33.0)
MCHC: 34 g/dL (ref 31.5–35.7)
MCV: 85 fL (ref 79–97)
Monocytes Absolute: 0.6 10*3/uL (ref 0.1–0.9)
Monocytes: 8 %
Neutrophils Absolute: 4.4 10*3/uL (ref 1.4–7.0)
Neutrophils: 62 %
Platelets: 173 10*3/uL (ref 150–450)
RBC: 4.57 x10E6/uL (ref 4.14–5.80)
RDW: 12.9 % (ref 11.6–15.4)
WBC: 6.9 10*3/uL (ref 3.4–10.8)

## 2019-10-31 LAB — PROTIME-INR
INR: 3.3 — ABNORMAL HIGH (ref 0.9–1.2)
Prothrombin Time: 32.7 s — ABNORMAL HIGH (ref 9.1–12.0)

## 2019-10-31 LAB — COMPREHENSIVE METABOLIC PANEL
ALT: 15 IU/L (ref 0–44)
AST: 24 IU/L (ref 0–40)
Albumin/Globulin Ratio: 2 (ref 1.2–2.2)
Albumin: 4.6 g/dL (ref 3.8–4.8)
Alkaline Phosphatase: 47 IU/L (ref 44–121)
BUN/Creatinine Ratio: 15 (ref 10–24)
BUN: 15 mg/dL (ref 8–27)
Bilirubin Total: 0.2 mg/dL (ref 0.0–1.2)
CO2: 20 mmol/L (ref 20–29)
Calcium: 8.9 mg/dL (ref 8.6–10.2)
Chloride: 102 mmol/L (ref 96–106)
Creatinine, Ser: 0.99 mg/dL (ref 0.76–1.27)
GFR calc Af Amer: 93 mL/min/{1.73_m2} (ref >59)
GFR calc non Af Amer: 80 mL/min/{1.73_m2} (ref >59)
Globulin, Total: 2.3 g/dL (ref 1.5–4.5)
Glucose: 101 mg/dL — ABNORMAL HIGH (ref 65–99)
Potassium: 4.6 mmol/L (ref 3.5–5.2)
Sodium: 138 mmol/L (ref 134–144)
Total Protein: 6.9 g/dL (ref 6.0–8.5)

## 2019-10-31 LAB — LIPID PANEL
Chol/HDL Ratio: 3.7 ratio (ref 0.0–5.0)
Cholesterol, Total: 156 mg/dL (ref 100–199)
HDL: 42 mg/dL (ref >39)
LDL Chol Calc (NIH): 89 mg/dL (ref 0–99)
Triglycerides: 143 mg/dL (ref 0–149)
VLDL Cholesterol Cal: 25 mg/dL (ref 5–40)

## 2019-10-31 LAB — TSH: TSH: 1.31 u[IU]/mL (ref 0.450–4.500)

## 2019-11-02 ENCOUNTER — Other Ambulatory Visit: Payer: Self-pay

## 2019-11-02 DIAGNOSIS — Z7901 Long term (current) use of anticoagulants: Secondary | ICD-10-CM

## 2019-11-02 DIAGNOSIS — I4811 Longstanding persistent atrial fibrillation: Secondary | ICD-10-CM

## 2019-11-06 ENCOUNTER — Other Ambulatory Visit: Payer: Self-pay | Admitting: Cardiology

## 2019-11-12 ENCOUNTER — Other Ambulatory Visit: Payer: Self-pay | Admitting: Cardiovascular Disease

## 2019-12-02 DIAGNOSIS — Z7901 Long term (current) use of anticoagulants: Secondary | ICD-10-CM | POA: Diagnosis not present

## 2019-12-02 LAB — POCT INR: INR: 2.9 (ref 2.0–3.0)

## 2019-12-03 ENCOUNTER — Telehealth: Payer: Self-pay

## 2019-12-03 ENCOUNTER — Ambulatory Visit (INDEPENDENT_AMBULATORY_CARE_PROVIDER_SITE_OTHER): Payer: Medicare Other | Admitting: Cardiology

## 2019-12-03 DIAGNOSIS — I4891 Unspecified atrial fibrillation: Secondary | ICD-10-CM

## 2019-12-03 DIAGNOSIS — Z8673 Personal history of transient ischemic attack (TIA), and cerebral infarction without residual deficits: Secondary | ICD-10-CM

## 2019-12-03 DIAGNOSIS — Z7901 Long term (current) use of anticoagulants: Secondary | ICD-10-CM

## 2019-12-03 LAB — PROTIME-INR
INR: 2.9 — ABNORMAL HIGH (ref 0.9–1.2)
Prothrombin Time: 28.7 s — ABNORMAL HIGH (ref 9.1–12.0)

## 2019-12-03 NOTE — Telephone Encounter (Signed)
Called and lmomed the pt that they need to complete inr check

## 2019-12-08 DIAGNOSIS — Z79899 Other long term (current) drug therapy: Secondary | ICD-10-CM | POA: Diagnosis not present

## 2019-12-08 DIAGNOSIS — M5136 Other intervertebral disc degeneration, lumbar region: Secondary | ICD-10-CM | POA: Diagnosis not present

## 2019-12-08 DIAGNOSIS — M47817 Spondylosis without myelopathy or radiculopathy, lumbosacral region: Secondary | ICD-10-CM | POA: Diagnosis not present

## 2019-12-08 DIAGNOSIS — Z79891 Long term (current) use of opiate analgesic: Secondary | ICD-10-CM | POA: Diagnosis not present

## 2019-12-08 DIAGNOSIS — G90519 Complex regional pain syndrome I of unspecified upper limb: Secondary | ICD-10-CM | POA: Diagnosis not present

## 2019-12-08 DIAGNOSIS — G894 Chronic pain syndrome: Secondary | ICD-10-CM | POA: Diagnosis not present

## 2019-12-22 ENCOUNTER — Other Ambulatory Visit: Payer: Self-pay | Admitting: Family Medicine

## 2019-12-22 DIAGNOSIS — I10 Essential (primary) hypertension: Secondary | ICD-10-CM

## 2019-12-22 DIAGNOSIS — E039 Hypothyroidism, unspecified: Secondary | ICD-10-CM

## 2020-01-20 ENCOUNTER — Telehealth: Payer: Self-pay

## 2020-01-20 NOTE — Telephone Encounter (Signed)
Spoke w/wife regarding needing inr check and she stated that they will complete asap after the holidays

## 2020-01-26 LAB — PROTIME-INR
INR: 2.7 — ABNORMAL HIGH (ref 0.9–1.2)
Prothrombin Time: 27.6 s — ABNORMAL HIGH (ref 9.1–12.0)

## 2020-01-27 ENCOUNTER — Ambulatory Visit (INDEPENDENT_AMBULATORY_CARE_PROVIDER_SITE_OTHER): Payer: Medicare HMO | Admitting: Pharmacist Clinician (PhC)/ Clinical Pharmacy Specialist

## 2020-01-27 DIAGNOSIS — I4891 Unspecified atrial fibrillation: Secondary | ICD-10-CM | POA: Diagnosis not present

## 2020-01-27 DIAGNOSIS — Z7901 Long term (current) use of anticoagulants: Secondary | ICD-10-CM | POA: Diagnosis not present

## 2020-01-27 DIAGNOSIS — Z8673 Personal history of transient ischemic attack (TIA), and cerebral infarction without residual deficits: Secondary | ICD-10-CM

## 2020-01-31 ENCOUNTER — Other Ambulatory Visit: Payer: Self-pay | Admitting: Cardiovascular Disease

## 2020-02-07 ENCOUNTER — Other Ambulatory Visit: Payer: Self-pay | Admitting: Cardiovascular Disease

## 2020-02-10 DIAGNOSIS — Z79891 Long term (current) use of opiate analgesic: Secondary | ICD-10-CM | POA: Diagnosis not present

## 2020-02-10 DIAGNOSIS — Z79899 Other long term (current) drug therapy: Secondary | ICD-10-CM | POA: Diagnosis not present

## 2020-02-10 DIAGNOSIS — M5136 Other intervertebral disc degeneration, lumbar region: Secondary | ICD-10-CM | POA: Diagnosis not present

## 2020-02-10 DIAGNOSIS — G894 Chronic pain syndrome: Secondary | ICD-10-CM | POA: Diagnosis not present

## 2020-02-10 DIAGNOSIS — M47817 Spondylosis without myelopathy or radiculopathy, lumbosacral region: Secondary | ICD-10-CM | POA: Diagnosis not present

## 2020-02-10 DIAGNOSIS — G90519 Complex regional pain syndrome I of unspecified upper limb: Secondary | ICD-10-CM | POA: Diagnosis not present

## 2020-02-16 DIAGNOSIS — Z79891 Long term (current) use of opiate analgesic: Secondary | ICD-10-CM | POA: Insufficient documentation

## 2020-02-16 DIAGNOSIS — G90519 Complex regional pain syndrome I of unspecified upper limb: Secondary | ICD-10-CM | POA: Insufficient documentation

## 2020-02-16 DIAGNOSIS — G894 Chronic pain syndrome: Secondary | ICD-10-CM | POA: Insufficient documentation

## 2020-02-16 DIAGNOSIS — Z79899 Other long term (current) drug therapy: Secondary | ICD-10-CM | POA: Insufficient documentation

## 2020-02-16 DIAGNOSIS — M47817 Spondylosis without myelopathy or radiculopathy, lumbosacral region: Secondary | ICD-10-CM | POA: Insufficient documentation

## 2020-02-16 DIAGNOSIS — M5136 Other intervertebral disc degeneration, lumbar region: Secondary | ICD-10-CM | POA: Insufficient documentation

## 2020-02-16 DIAGNOSIS — M25532 Pain in left wrist: Secondary | ICD-10-CM | POA: Insufficient documentation

## 2020-03-09 DIAGNOSIS — I4811 Longstanding persistent atrial fibrillation: Secondary | ICD-10-CM | POA: Diagnosis not present

## 2020-03-09 DIAGNOSIS — M5136 Other intervertebral disc degeneration, lumbar region: Secondary | ICD-10-CM | POA: Diagnosis not present

## 2020-03-09 DIAGNOSIS — G894 Chronic pain syndrome: Secondary | ICD-10-CM | POA: Diagnosis not present

## 2020-03-09 DIAGNOSIS — G90519 Complex regional pain syndrome I of unspecified upper limb: Secondary | ICD-10-CM | POA: Diagnosis not present

## 2020-03-09 DIAGNOSIS — M47817 Spondylosis without myelopathy or radiculopathy, lumbosacral region: Secondary | ICD-10-CM | POA: Diagnosis not present

## 2020-03-10 LAB — PROTIME-INR
INR: 3.4 — ABNORMAL HIGH (ref 0.9–1.2)
Prothrombin Time: 33.9 s — ABNORMAL HIGH (ref 9.1–12.0)

## 2020-03-11 ENCOUNTER — Ambulatory Visit (INDEPENDENT_AMBULATORY_CARE_PROVIDER_SITE_OTHER): Payer: Medicare HMO | Admitting: Pharmacist Clinician (PhC)/ Clinical Pharmacy Specialist

## 2020-03-11 DIAGNOSIS — Z8673 Personal history of transient ischemic attack (TIA), and cerebral infarction without residual deficits: Secondary | ICD-10-CM | POA: Diagnosis not present

## 2020-03-11 DIAGNOSIS — Z7901 Long term (current) use of anticoagulants: Secondary | ICD-10-CM | POA: Diagnosis not present

## 2020-03-11 DIAGNOSIS — I4891 Unspecified atrial fibrillation: Secondary | ICD-10-CM

## 2020-03-15 ENCOUNTER — Other Ambulatory Visit: Payer: Self-pay | Admitting: Family Medicine

## 2020-03-15 DIAGNOSIS — E039 Hypothyroidism, unspecified: Secondary | ICD-10-CM

## 2020-03-15 DIAGNOSIS — I10 Essential (primary) hypertension: Secondary | ICD-10-CM

## 2020-03-26 DIAGNOSIS — R69 Illness, unspecified: Secondary | ICD-10-CM | POA: Diagnosis not present

## 2020-03-26 DIAGNOSIS — Z823 Family history of stroke: Secondary | ICD-10-CM | POA: Diagnosis not present

## 2020-03-26 DIAGNOSIS — E785 Hyperlipidemia, unspecified: Secondary | ICD-10-CM | POA: Diagnosis not present

## 2020-03-26 DIAGNOSIS — I10 Essential (primary) hypertension: Secondary | ICD-10-CM | POA: Diagnosis not present

## 2020-03-26 DIAGNOSIS — K219 Gastro-esophageal reflux disease without esophagitis: Secondary | ICD-10-CM | POA: Diagnosis not present

## 2020-03-26 DIAGNOSIS — Z8249 Family history of ischemic heart disease and other diseases of the circulatory system: Secondary | ICD-10-CM | POA: Diagnosis not present

## 2020-03-26 DIAGNOSIS — E039 Hypothyroidism, unspecified: Secondary | ICD-10-CM | POA: Diagnosis not present

## 2020-03-26 DIAGNOSIS — M1990 Unspecified osteoarthritis, unspecified site: Secondary | ICD-10-CM | POA: Diagnosis not present

## 2020-03-26 DIAGNOSIS — G8929 Other chronic pain: Secondary | ICD-10-CM | POA: Diagnosis not present

## 2020-03-26 DIAGNOSIS — Z7901 Long term (current) use of anticoagulants: Secondary | ICD-10-CM | POA: Diagnosis not present

## 2020-04-06 ENCOUNTER — Telehealth: Payer: Self-pay

## 2020-04-06 NOTE — Telephone Encounter (Signed)
alled and lmomed the pt that they were overdue for an inr check at [facility]

## 2020-04-07 DIAGNOSIS — Z7901 Long term (current) use of anticoagulants: Secondary | ICD-10-CM | POA: Diagnosis not present

## 2020-04-07 LAB — POCT INR: INR: 3.5 — AB (ref 2.0–3.0)

## 2020-04-08 DIAGNOSIS — M5136 Other intervertebral disc degeneration, lumbar region: Secondary | ICD-10-CM | POA: Diagnosis not present

## 2020-04-08 DIAGNOSIS — G894 Chronic pain syndrome: Secondary | ICD-10-CM | POA: Diagnosis not present

## 2020-04-08 DIAGNOSIS — M47817 Spondylosis without myelopathy or radiculopathy, lumbosacral region: Secondary | ICD-10-CM | POA: Diagnosis not present

## 2020-04-08 DIAGNOSIS — G90519 Complex regional pain syndrome I of unspecified upper limb: Secondary | ICD-10-CM | POA: Diagnosis not present

## 2020-04-08 LAB — PROTIME-INR
INR: 3.5 — ABNORMAL HIGH (ref 0.9–1.2)
Prothrombin Time: 35.4 s — ABNORMAL HIGH (ref 9.1–12.0)

## 2020-04-11 ENCOUNTER — Ambulatory Visit (INDEPENDENT_AMBULATORY_CARE_PROVIDER_SITE_OTHER): Payer: Medicare HMO | Admitting: Internal Medicine

## 2020-04-11 DIAGNOSIS — Z8673 Personal history of transient ischemic attack (TIA), and cerebral infarction without residual deficits: Secondary | ICD-10-CM

## 2020-04-11 DIAGNOSIS — Z79891 Long term (current) use of opiate analgesic: Secondary | ICD-10-CM | POA: Diagnosis not present

## 2020-04-11 DIAGNOSIS — Z7901 Long term (current) use of anticoagulants: Secondary | ICD-10-CM | POA: Diagnosis not present

## 2020-04-11 DIAGNOSIS — I4811 Longstanding persistent atrial fibrillation: Secondary | ICD-10-CM | POA: Diagnosis not present

## 2020-04-23 ENCOUNTER — Other Ambulatory Visit: Payer: Self-pay | Admitting: Cardiovascular Disease

## 2020-05-01 ENCOUNTER — Other Ambulatory Visit: Payer: Self-pay | Admitting: Cardiovascular Disease

## 2020-05-05 DIAGNOSIS — Z79891 Long term (current) use of opiate analgesic: Secondary | ICD-10-CM | POA: Diagnosis not present

## 2020-05-05 DIAGNOSIS — G894 Chronic pain syndrome: Secondary | ICD-10-CM | POA: Diagnosis not present

## 2020-05-05 DIAGNOSIS — G90519 Complex regional pain syndrome I of unspecified upper limb: Secondary | ICD-10-CM | POA: Diagnosis not present

## 2020-05-05 DIAGNOSIS — M47817 Spondylosis without myelopathy or radiculopathy, lumbosacral region: Secondary | ICD-10-CM | POA: Diagnosis not present

## 2020-05-05 DIAGNOSIS — Z79899 Other long term (current) drug therapy: Secondary | ICD-10-CM | POA: Diagnosis not present

## 2020-05-05 DIAGNOSIS — M5136 Other intervertebral disc degeneration, lumbar region: Secondary | ICD-10-CM | POA: Diagnosis not present

## 2020-05-11 ENCOUNTER — Telehealth: Payer: Self-pay

## 2020-05-11 NOTE — Telephone Encounter (Signed)
Told pt's wife that the pt was overdue and they stated that they will try to do better and call us the day after they get the inr done at [place] so that we can dose for them

## 2020-05-18 DIAGNOSIS — I4811 Longstanding persistent atrial fibrillation: Secondary | ICD-10-CM | POA: Diagnosis not present

## 2020-05-19 ENCOUNTER — Ambulatory Visit (INDEPENDENT_AMBULATORY_CARE_PROVIDER_SITE_OTHER): Payer: Medicare HMO | Admitting: Pharmacist Clinician (PhC)/ Clinical Pharmacy Specialist

## 2020-05-19 DIAGNOSIS — Z8673 Personal history of transient ischemic attack (TIA), and cerebral infarction without residual deficits: Secondary | ICD-10-CM

## 2020-05-19 DIAGNOSIS — I4891 Unspecified atrial fibrillation: Secondary | ICD-10-CM | POA: Diagnosis not present

## 2020-05-19 DIAGNOSIS — Z7901 Long term (current) use of anticoagulants: Secondary | ICD-10-CM | POA: Diagnosis not present

## 2020-05-19 LAB — PROTIME-INR
INR: 2.4 — ABNORMAL HIGH (ref 0.9–1.2)
Prothrombin Time: 24.1 s — ABNORMAL HIGH (ref 9.1–12.0)

## 2020-06-02 DIAGNOSIS — M47817 Spondylosis without myelopathy or radiculopathy, lumbosacral region: Secondary | ICD-10-CM | POA: Diagnosis not present

## 2020-06-02 DIAGNOSIS — M5136 Other intervertebral disc degeneration, lumbar region: Secondary | ICD-10-CM | POA: Diagnosis not present

## 2020-06-02 DIAGNOSIS — G90519 Complex regional pain syndrome I of unspecified upper limb: Secondary | ICD-10-CM | POA: Diagnosis not present

## 2020-06-02 DIAGNOSIS — G894 Chronic pain syndrome: Secondary | ICD-10-CM | POA: Diagnosis not present

## 2020-06-10 ENCOUNTER — Other Ambulatory Visit: Payer: Self-pay | Admitting: Family Medicine

## 2020-06-10 DIAGNOSIS — E039 Hypothyroidism, unspecified: Secondary | ICD-10-CM

## 2020-06-11 ENCOUNTER — Other Ambulatory Visit: Payer: Self-pay | Admitting: Family Medicine

## 2020-06-11 DIAGNOSIS — I10 Essential (primary) hypertension: Secondary | ICD-10-CM

## 2020-06-22 ENCOUNTER — Telehealth: Payer: Self-pay

## 2020-06-22 DIAGNOSIS — Z7901 Long term (current) use of anticoagulants: Secondary | ICD-10-CM

## 2020-06-22 NOTE — Telephone Encounter (Signed)
lmom for overdue inr 

## 2020-06-23 ENCOUNTER — Telehealth: Payer: Self-pay

## 2020-06-23 NOTE — Telephone Encounter (Signed)
inr orders mailed to take to lab to complete asap

## 2020-06-28 NOTE — Telephone Encounter (Signed)
    Pt's wife returning call, she said she tested covid + the reason why they haven't gone to get INR check. She said once both of them are cleared they will go to [facility] at [facility] to get INR check

## 2020-07-07 ENCOUNTER — Telehealth: Payer: Self-pay

## 2020-07-07 NOTE — Telephone Encounter (Signed)
Lmom the pt to get lab done for inr

## 2020-07-08 DIAGNOSIS — Z7901 Long term (current) use of anticoagulants: Secondary | ICD-10-CM | POA: Diagnosis not present

## 2020-07-08 LAB — POCT INR: INR: 3.2 — AB (ref 2.0–3.0)

## 2020-07-09 LAB — PROTIME-INR
INR: 3.2 — ABNORMAL HIGH (ref 0.9–1.2)
Prothrombin Time: 31.9 s — ABNORMAL HIGH (ref 9.1–12.0)

## 2020-07-11 ENCOUNTER — Ambulatory Visit (INDEPENDENT_AMBULATORY_CARE_PROVIDER_SITE_OTHER): Payer: Medicare HMO | Admitting: Cardiology

## 2020-07-11 DIAGNOSIS — Z7901 Long term (current) use of anticoagulants: Secondary | ICD-10-CM

## 2020-07-11 DIAGNOSIS — Z8673 Personal history of transient ischemic attack (TIA), and cerebral infarction without residual deficits: Secondary | ICD-10-CM

## 2020-07-14 DIAGNOSIS — G894 Chronic pain syndrome: Secondary | ICD-10-CM | POA: Diagnosis not present

## 2020-07-14 DIAGNOSIS — M47817 Spondylosis without myelopathy or radiculopathy, lumbosacral region: Secondary | ICD-10-CM | POA: Diagnosis not present

## 2020-07-14 DIAGNOSIS — M5136 Other intervertebral disc degeneration, lumbar region: Secondary | ICD-10-CM | POA: Diagnosis not present

## 2020-07-14 DIAGNOSIS — G90519 Complex regional pain syndrome I of unspecified upper limb: Secondary | ICD-10-CM | POA: Diagnosis not present

## 2020-07-16 ENCOUNTER — Other Ambulatory Visit: Payer: Self-pay | Admitting: Family Medicine

## 2020-07-16 DIAGNOSIS — E7800 Pure hypercholesterolemia, unspecified: Secondary | ICD-10-CM

## 2020-07-24 ENCOUNTER — Other Ambulatory Visit: Payer: Self-pay | Admitting: Cardiovascular Disease

## 2020-07-26 NOTE — Telephone Encounter (Signed)
This is Dr. Berry's pt 

## 2020-07-27 ENCOUNTER — Telehealth: Payer: Self-pay | Admitting: Cardiovascular Disease

## 2020-07-27 MED ORDER — CARVEDILOL 25 MG PO TABS: 37.5000 mg | ORAL_TABLET | Freq: Two times a day (BID) | ORAL | 0 refills | Status: DC

## 2020-07-27 NOTE — Telephone Encounter (Signed)
*  STAT* If patient is at the pharmacy, call can be transferred to refill team.   1. Which medications need to be refilled? (please list name of each medication and dose if known) carvedilol (COREG) 25 MG tablet  2. Which pharmacy/location (including street and city if local pharmacy) is medication to be sent to? [facility] [place] [place] [facility] [address]  3. Do they need a 30 day or 90 day supply? 90 day supply  Pt has been scheduled for first available appointments with both providers.

## 2020-07-28 NOTE — Telephone Encounter (Signed)
Rx(s) sent to pharmacy electronically.  

## 2020-07-29 ENCOUNTER — Telehealth: Payer: Self-pay

## 2020-07-29 NOTE — Telephone Encounter (Signed)
   [facility] Pre-operative Risk Assessment    Patient Name: Eric Ross  DOB: 1955/08/08  MRN: [id number]   HEARTCARE STAFF: - Please ensure there is not already an duplicate clearance open for this procedure. - Under Visit Info/Reason for Call, type in Other and utilize the format Clearance MM/DD/YY or Clearance TBD. Do not use dashes or single digits. - If request is for dental extraction, please clarify the # of teeth to be extracted. - If the patient is currently at the dentist's office, call Pre-Op APP to address. If the patient is not currently in the dentist office, please route to the Pre-Op pool  Request for surgical clearance:  What type of surgery is being performed? Extractions, Cleaning/Periodontal Treatment, Crown and Bridge Work.  When is this surgery scheduled? TBD   What type of clearance is required (medical clearance vs. Pharmacy clearance to hold med vs. Both)? BOTH  Are there any medications that need to be held prior to surgery and how long? Coumadin    Practice name and name of physician performing surgery? [facility]    What is the office phone number? [phone]   7.   What is the office fax number? NA  8.   Anesthesia type (None, local, MAC, general) ? Unknown   Ena Dawley 07/29/2020, 1:47 PM  _________________________________________________________________   (provider comments below)

## 2020-07-29 NOTE — Telephone Encounter (Signed)
There is no need to hold warfarin for extractions of 2 or fewer teeth. No need to hold for standard cleaning. We cannot provide blanket clearance. If patient is having a specific procedure, please send clearance for that procedure.

## 2020-07-29 NOTE — Telephone Encounter (Signed)
Left message for Jamul to give our office a call back on Tuesday due to to the Holiday on Monday.

## 2020-07-29 NOTE — Telephone Encounter (Signed)
Please contact requesting office and ask for specific details surrounding patient's dental procedure.  We do not provide blanket coverage.  Thank you.

## 2020-07-31 ENCOUNTER — Other Ambulatory Visit: Payer: Self-pay | Admitting: Cardiovascular Disease

## 2020-08-02 NOTE — Telephone Encounter (Signed)
DPR ok to s/w pt's wife [name]. I s/w pt's wife and informed her that Dr. Gwenlyn Found has been asked to give clearance on an upcoming dental procedure for the pt. I stated I know the pt has appt with Dr. Gwenlyn Found in 09/2020 though thought they might want something sooner if available, pt wife agreed to a sooner appt. I offered and scheduled an appt with Cecilie Kicks, NP 08/04/20 @ 10:45 at the [facility] location. Pt's wife is agreeable to appt date, time and location. I will forward notes to NP for upcoming appt. Will send FYI to requesting office pt has appt 08/04/20.

## 2020-08-02 NOTE — Telephone Encounter (Signed)
   [facility] Pre-operative Risk Assessment   HEARTCARE STAFF: - Please ensure there is not already an duplicate clearance open for this procedure. - Under Visit Info/Reason for Call, type in Other and utilize the format Clearance MM/DD/YY or Clearance TBD. Do not use dashes or single digits. - If request is for dental extraction, please clarify the # of teeth to be extracted. - If the patient is currently at the dentist's office, call Pre-Op APP to address. If the patient is not currently in the dentist office, please route to the Pre-Op pool  Request for surgical clearance:  What type of surgery is being performed? 4 Extractions and 2 crowns with possible implants if pt does not want to go forward with implants he will receive bridges   When is this surgery scheduled? TBD, waiting for clearance  What type of clearance is required (medical clearance vs. Pharmacy clearance to hold med vs. Both)? Medical   Are there any medications that need to be held prior to surgery and how long? TBD by Cardiology   Practice name and name of physician performing surgery? [employer], Dr. Jaquita Rector   What is the office phone number? [phone]   7.   What is the office fax number?   [phone]  8.   Anesthesia type (None, local, MAC, general) ? No unless pt wants it   Trilby Drummer 08/02/2020, 8:19 AM  _________________________________________________________________   (provider comments below)

## 2020-08-02 NOTE — Telephone Encounter (Signed)
Patient with diagnosis of A Fib on warfarin for anticoagulation.    Procedure: 4 Extractions and 2 crowns with possible implants if pt does not want to go forward with implants he will receive bridges   Date of procedure: TBD   CHA2DS2-VASc Score = 4  This indicates a 4.8% annual risk of stroke. The patient's score is based upon: CHF History: No HTN History: Yes Diabetes History: No Stroke History: Yes Vascular Disease History: No Age Score: 1 Gender Score: 0    CrCl 87 mL/min Platelet count 173K  Patient does not require pre-op antibiotics for dental procedure.  Patient on warfarin with A Fib and history of stroke. Typically recommend to hold for 5 days and bridge with Lovenox.  Will route to Dr Gwenlyn Found for input.

## 2020-08-02 NOTE — Telephone Encounter (Signed)
Primary Cardiologist:Jonathan Gwenlyn Found, MD  Chart reviewed as part of pre-operative protocol coverage. Because of Eric Ross past medical history and time since last visit, he/she will require a follow-up visit in order to better assess preoperative cardiovascular risk.  Pre-op covering staff: - Please schedule appointment and call patient to inform them. - Please contact requesting surgeon's office via preferred method (i.e, phone, fax) to inform them of need for appointment prior to surgery.  If applicable, this message will also be routed to pharmacy pool and/or primary cardiologist for input on holding anticoagulant/antiplatelet agent as requested below so that this information is available at time of patient's appointment.   Deberah Pelton, NP  08/02/2020, 2:17 PM

## 2020-08-03 NOTE — Progress Notes (Addendum)
Cardiology Office Note   Date:  08/04/2020   ID:  Eric, Ross [birth year], MRN [id number]  PCP:  Denita Lung, MD  Cardiologist:  Dr. Gwenlyn Found  Chief Complaint  Patient presents with   Pre-op Exam   Pre op eval    History of Present Illness: Eric Ross is a 65 y.o. male who presents for pre-op eval for   aspen dental 4 extractions, 2 crowns cleaning periodonal treatment. Patient on warfarin with A Fib and history of stroke. Typically recommend to hold for 5 days and bridge with Lovenox.  Will route to Dr Gwenlyn Found for input and he did note should have crossover.   Pt with a history of nonischemic cardiomyopathy cath performed in 2003 that showed normal coronary arteries. He has an ejection fraction in the 45-50% range. He does have chronic A. Fib t.  on Coumadin anticoagulation rate controlled. His other problems include hyperlipidemia on statin therapy and treated hypertension. He does get occasional chest burning and a Myoview stress test performed 05/30/11 which was normal.  On last visit with Dr. Gwenlyn Found he continued with some chest pain SOB and dizziness    A 2D echo revealed an EF of 45%.  Referred him to Dr. Curt Bears who adjusted his carvedilol.  His symptoms of dizziness have somewhat improved although he still has some dyspnea on exertion and atypical chest pain.  Today he relates he has chest pain, 2-3 times per week.  Occurs at anytime, and is brief.  No associated symptoms we discussed with his hx of CVA he would need crossover while off coumadin.  Wife is with him for appt.     Past Medical History:  Diagnosis Date   Atrial fibrillation ([place])    Dr. Gwenlyn Found   Cardiomyopathy    nonischemic   Erectile dysfunction    History of thyroid cancer    Hyperlipidemia    Hypothyroidism    Pseudogout    Stroke [facility]) 2003    Past Surgical History:  Procedure Laterality Date   CARDIAC CATHETERIZATION  09/22/01   NORMAL CORONARY ARTERIES   JOINT REPLACEMENT      BILATERAL KNEE    LEXISCAN MYOCARDIAL PERFUSION STUDY  05/30/11   NORMAL MYOCARDIAL PERFUSION STUDY EF% 49%.   THYROIDECTOMY     TRANSTHORACIC ECHOCARDIOGRAM  05/30/11   EF%=45-50%. NORMAL LV WALL THICKNESS. RV IS MILDLY DILATED. NO SIGNIFICANT VALVE DISEASE.      Current Outpatient Medications  Medication Sig Dispense Refill   carvedilol (COREG) 25 MG tablet Take 1.5 tablets (37.5 mg total) by mouth 2 (two) times daily. 270 tablet 0   HYDROcodone-acetaminophen (NORCO/VICODIN) 5-325 MG tablet Take 1 tablet by mouth 4 (four) times daily as needed (pain).      levothyroxine (SYNTHROID) 112 MCG tablet TAKE 1 TABLET BY MOUTH EVERY DAY 90 tablet 0   pantoprazole (PROTONIX) 40 MG tablet Take 1 tablet (40 mg total) by mouth daily. 90 tablet 3   ramipril (ALTACE) 2.5 MG capsule TAKE 1 CAPSULE BY MOUTH EVERY DAY 90 capsule 0   simvastatin (ZOCOR) 20 MG tablet TAKE 1 TABLET BY MOUTH EVERY DAY 90 tablet 0   tadalafil (CIALIS) 20 MG tablet Take 1 tablet (20 mg total) by mouth daily as needed for erectile dysfunction. 30 tablet 5   warfarin (COUMADIN) 5 MG tablet TAKE 1/2 TO 1 TABLET BY MOUTH DAILY AS DIRECTED BY COUMADIN CLINIC 90 tablet 0   dicyclomine (BENTYL) 20 MG tablet Take 1 tablet (20  mg total) by mouth 2 (two) times daily. (Patient not taking: Reported on 08/04/2020) 10 tablet 0   No current facility-administered medications for this visit.    Allergies:   Penicillins    Social History:  The patient  reports that he has never smoked. His smokeless tobacco use includes chew. He reports that he does not drink alcohol and does not use drugs.   Family History:  The patient's family history includes Cancer in his sister; Stroke in his brother; Thyroid cancer in his mother.    ROS:  General:no colds or fevers, no weight changes Skin:no rashes or ulcers HEENT:no blurred vision, no congestion, speech difficult to understand for me, some words are easier.  CV:see HPI PUL:see HPI GI:no diarrhea  constipation or melena, no indigestion GU:no hematuria, no dysuria MS:no joint pain, no claudication, wears brace on lt arm  Neuro:no syncope, no lightheadedness Endo:no diabetes, no thyroid disease  Wt Readings from Last 3 Encounters:  08/04/20 180 lb 9.6 oz (81.9 kg)  10/30/19 183 lb (83 kg)  08/27/19 186 lb (84.4 kg)     PHYSICAL EXAM: VS:  BP 130/68   Pulse 76   Ht 6\' 1"  (1.854 m)   Wt 180 lb 9.6 oz (81.9 kg)   SpO2 94%   BMI 23.83 kg/m  , BMI Body mass index is 23.83 kg/m. General:Pleasant affect, NAD Skin:Warm and dry, brisk capillary refill HEENT:normocephalic, sclera clear, mucus membranes moist Neck:supple, no JVD, no bruits  Heart:irreg irreg without murmur, gallup, rub or click Lungs:clear without rales, rhonchi, or wheezes GQQ:PYPP, non tender, + BS, do not palpate liver spleen or masses Ext:no lower ext edema, 2+ pedal pulses, 2+ radial pulses Neuro:alert and oriented, MAE, follows commands, + facial symmetry    EKG:  EKG is ordered today. The ekg ordered today demonstrates atrial fib rate controlled and no ST changes from prior. Stable EKG.   Recent Labs: 10/30/2019: ALT 15; BUN 15; Creatinine, Ser 0.99; Hemoglobin 13.2; Platelets 173; Potassium 4.6; Sodium 138; TSH 1.310    Lipid Panel    Component Value Date/Time   CHOL 156 10/30/2019 1438   TRIG 143 10/30/2019 1438   HDL 42 10/30/2019 1438   CHOLHDL 3.7 10/30/2019 1438   CHOLHDL 3.6 04/25/2016 1402   VLDL 30 04/25/2016 1402   LDLCALC 89 10/30/2019 1438       Other studies Reviewed: Additional studies/ records that were reviewed today include: . Echo 01/15/2018 Study Conclusions   - Left ventricle: The cavity size was normal. Wall thickness was    normal. The estimated ejection fraction was 45%. Diffuse    hypokinesis. The study was not technically sufficient to allow    evaluation of LV diastolic dysfunction due to atrial    fibrillation.  - Aortic valve: There was no stenosis.  -  Mitral valve: There was trivial regurgitation.  - Left atrium: The atrium was moderately dilated.  - Right ventricle: The cavity size was mildly dilated. Systolic    function was normal.  - Right atrium: The atrium was mildly dilated.  - Tricuspid valve: Peak RV-RA gradient (S): 23 mm Hg.  - Pulmonary arteries: PA peak pressure: 26 mm Hg (S).  - Inferior vena cava: The vessel was normal in size. The    respirophasic diameter changes were in the normal range (= 50%),    consistent with normal central venous pressure.   Impressions:   - Normal LV size with EF 45%, diffuse hypokinesis. Mildly dilated  RV with normal systolic function. Biatrial enlargement. No    significant valvular abnormalities.   -------------------------------------------------------------------  Study data:   Study status:  Routine.  Procedure:  The patient  reported no pain pre or post test. Transthoracic echocardiography  for left ventricular function evaluation, for right ventricular  function evaluation, and for assessment of valvular function. Image  quality was adequate.  Study completion:  There were no  complications.          Transthoracic echocardiography.  M-mode,  complete 2D, spectral Doppler, and color Doppler.  Birthdate:  Patient birthdate: 07-Feb-1955.  Age:  Patient is 65 yr old.  Sex:  Gender: male.    BMI: 23.6 kg/m^2.  Blood pressure:     118/72  Patient status:  Outpatient.  Study date:  Study date: 01/15/2018.  Study time: 10:48 AM.  Location:  [facility]   -------------------------------------------------------------------   -------------------------------------------------------------------  Left ventricle:  The cavity size was normal. Wall thickness was  normal. The estimated ejection fraction was 45%. Diffuse  hypokinesis. The study was not technically sufficient to allow  evaluation of LV diastolic dysfunction due to atrial fibrillation.      -------------------------------------------------------------------  Aortic valve:   Trileaflet.  Doppler:   There was no stenosis.  There was no regurgitation.   -------------------------------------------------------------------  Aorta:  Aortic root: The aortic root was normal in size.  Ascending aorta: The ascending aorta was normal in size.   -------------------------------------------------------------------  Mitral valve:   Normal thickness leaflets .  Doppler:   There was  no evidence for stenosis.   There was trivial regurgitation.  Peak gradient (D): 4 mm Hg.   -------------------------------------------------------------------  Left atrium:  The atrium was moderately dilated.   -------------------------------------------------------------------  Right ventricle:  The cavity size was mildly dilated. Systolic  function was normal.   -------------------------------------------------------------------  Pulmonic valve:    Structurally normal valve.   Cusp separation was  normal.  Doppler:  Transvalvular velocity was within the normal  range. There was no regurgitation.   -------------------------------------------------------------------  Tricuspid valve:   Doppler:  There was trivial regurgitation.     -------------------------------------------------------------------  Right atrium:  The atrium was mildly dilated.   -------------------------------------------------------------------  Pericardium:  There was no pericardial effusion.   -------------------------------------------------------------------  Systemic veins:  Inferior vena cava: The vessel was normal in size. The  respirophasic diameter changes were in the normal range (= 50%),  consistent with normal central venous pressure.    2013 neg lexiscan myoview  ASSESSMENT AND PLAN:  1.  Pre-op eval - will need to hold coumadin and have lovenox crossover for dental extractions. With his hx of CVA.  Will ask our  pharmacy to arrange.  He has chest pain atypical but cath was in 2003 and no CAD and nuc in 2013.  Will proceed with lexiscan myoview.  If no ischemia then cleared for surgery-dental with lovenox crossover.  2.  Chronic atrial fib rate controlled, followed by Dr. Curt Bears. Will see in near future, continue coreg. ( Failed DCCVs, amiodarone and tikosyn)  hx of a flutter ablation 2004  3.  HLD stable last lipids in 10/2019 with LDL 89 and no known CAD HDL 42   4.  NICM with improved EF 45% in 2019 up from 35% and euvolemic today.   5.  Hx CVA   6. Chronic anticoagulation   Discussed risk of lexiscan myoview. Pt agreeable to proceed.   Current medicines are reviewed with the patient today.  The  patient Has no concerns regarding medicines.  The following changes have been made:  See above Labs/ tests ordered today include:see above  Disposition:   FU:  see above  Signed, Cecilie Kicks, NP  08/04/2020 10:58 AM    [address], [place], [place], [place] Phone: [phone]; Fax: [phone]

## 2020-08-04 ENCOUNTER — Encounter: Payer: Self-pay | Admitting: Cardiology

## 2020-08-04 ENCOUNTER — Other Ambulatory Visit: Payer: Self-pay

## 2020-08-04 ENCOUNTER — Ambulatory Visit (INDEPENDENT_AMBULATORY_CARE_PROVIDER_SITE_OTHER): Payer: Medicare HMO | Admitting: Cardiology

## 2020-08-04 VITALS — BP 130/68 | HR 76 | Ht 73.0 in | Wt 180.6 lb

## 2020-08-04 DIAGNOSIS — Z01818 Encounter for other preprocedural examination: Secondary | ICD-10-CM

## 2020-08-04 DIAGNOSIS — Z7901 Long term (current) use of anticoagulants: Secondary | ICD-10-CM

## 2020-08-04 DIAGNOSIS — I200 Unstable angina: Secondary | ICD-10-CM

## 2020-08-04 DIAGNOSIS — Z8673 Personal history of transient ischemic attack (TIA), and cerebral infarction without residual deficits: Secondary | ICD-10-CM | POA: Diagnosis not present

## 2020-08-04 DIAGNOSIS — I428 Other cardiomyopathies: Secondary | ICD-10-CM

## 2020-08-04 DIAGNOSIS — I4821 Permanent atrial fibrillation: Secondary | ICD-10-CM

## 2020-08-04 NOTE — Telephone Encounter (Signed)
Per Dr Gwenlyn Found, patient will need Lovenox bridge.  Will send message to [facility] anticoagulation clinic

## 2020-08-04 NOTE — Patient Instructions (Addendum)
Medication Instructions:  Your physician recommends that you continue on your current medications as directed. Please refer to the Current Medication list given to you today.  *If you need a refill on your cardiac medications before your next appointment, please call your pharmacy*   Lab Work: None ordered  If you have labs (blood work) drawn today and your tests are completely normal, you will receive your results only by: [place] (if you have MyChart) OR A paper copy in the mail If you have any lab test that is abnormal or we need to change your treatment, we will call you to review the results.   Testing/Procedures: Your physician has requested that you have a lexiscan myoview. For further information please visit [email or web address]. Please follow instruction sheet,  BELOW:    You are scheduled for a Myocardial Perfusion Imaging Study on 08/17/2020 ARRIVE AT 7:00   The test will take approximately 3 to 4 hours to complete; you may bring reading material.  If someone comes with you to your appointment, they will need to remain in the main lobby due to limited space in the testing area. **If you are pregnant or breastfeeding, please notify the nuclear lab prior to your appointment**  How to prepare for your Myocardial Perfusion Test: Do not eat or drink 3 hours prior to your test, except you may have water. Do not consume products containing caffeine (regular or decaffeinated) 12 hours prior to your test. (ex: coffee, chocolate, sodas, tea). Do bring a list of your current medications with you.  If not listed below, you may take your medications as normal. Do wear comfortable clothes (no dresses or overalls) and walking shoes, tennis shoes preferred (No heels or open toe shoes are allowed). Do NOT wear cologne, perfume, aftershave, or lotions (deodorant is allowed). If these instructions are not followed, your test will have to be rescheduled.     Follow-Up: At [facility], you and your health needs are our priority.  As part of our continuing mission to provide you with exceptional heart care, we have created designated Provider Care Teams.  These Care Teams include your primary Cardiologist (physician) and Advanced Practice Providers (APPs -  Physician Assistants and Nurse Practitioners) who all work together to provide you with the care you need, when you need it.  We recommend signing up for the patient portal called "MyChart".  Sign up information is provided on this After Visit Summary.  MyChart is used to connect with3 patients for Virtual Visits (Telemedicine).  Patients are able to view lab/test results, encounter notes, upcoming appointments, etc.  Non-urgent messages can be sent to your provider as well.   To learn more about what you can do with MyChart, go to [email or web address].    Your next appointment:   As scheduled   The format for your next appointment:   In Person  Provider:   You may see Quay Burow, MD or one of the following Advanced Practice Providers on your designated Care Team:   [place], PA-C Coletta Memos, FNP   Other Instructions

## 2020-08-08 ENCOUNTER — Telehealth: Payer: Self-pay

## 2020-08-08 NOTE — Telephone Encounter (Signed)
I spoke to the patient's wife and she will f/u with a Dental procedure date after stress test 7/20.  Patient will need Lovenox Bridging prior to procedure date.

## 2020-08-10 ENCOUNTER — Telehealth (HOSPITAL_COMMUNITY): Payer: Self-pay | Admitting: *Deleted

## 2020-08-10 NOTE — Addendum Note (Signed)
Addended by: Isaiah Serge on: 08/10/2020 08:16 AM   Modules accepted: Orders

## 2020-08-10 NOTE — Telephone Encounter (Signed)
Patient's wife was given detailed instructions per DPR per Myocardial Perfusion Study Information Sheet for the test on 08/17/20 at 0715. Patient notified to arrive 15 minutes early and that it is imperative to arrive on time for appointment to keep from having the test rescheduled.  If you need to cancel or reschedule your appointment, please call the office within 24 hours of your appointment. . Patient verbalized understanding.Javiel Canepa, Ranae [place]ms

## 2020-08-11 DIAGNOSIS — G90519 Complex regional pain syndrome I of unspecified upper limb: Secondary | ICD-10-CM | POA: Diagnosis not present

## 2020-08-11 DIAGNOSIS — M5136 Other intervertebral disc degeneration, lumbar region: Secondary | ICD-10-CM | POA: Diagnosis not present

## 2020-08-11 DIAGNOSIS — Z79891 Long term (current) use of opiate analgesic: Secondary | ICD-10-CM | POA: Diagnosis not present

## 2020-08-11 DIAGNOSIS — Z79899 Other long term (current) drug therapy: Secondary | ICD-10-CM | POA: Diagnosis not present

## 2020-08-11 DIAGNOSIS — M47817 Spondylosis without myelopathy or radiculopathy, lumbosacral region: Secondary | ICD-10-CM | POA: Diagnosis not present

## 2020-08-11 DIAGNOSIS — G894 Chronic pain syndrome: Secondary | ICD-10-CM | POA: Diagnosis not present

## 2020-08-17 ENCOUNTER — Ambulatory Visit (HOSPITAL_COMMUNITY): Payer: Medicare HMO | Attending: Cardiology

## 2020-08-17 ENCOUNTER — Other Ambulatory Visit: Payer: Self-pay

## 2020-08-17 DIAGNOSIS — I200 Unstable angina: Secondary | ICD-10-CM | POA: Insufficient documentation

## 2020-08-17 LAB — MYOCARDIAL PERFUSION IMAGING
LV dias vol: 92 mL (ref 62–150)
LV sys vol: 51 mL
Peak HR: 137 {beats}/min
Rest HR: 75 {beats}/min
SDS: 2
SRS: 0
SSS: 2
TID: 0.89

## 2020-08-17 MED ORDER — TECHNETIUM TC 99M TETROFOSMIN IV KIT
11.0000 | PACK | Freq: Once | INTRAVENOUS | Status: AC | PRN
Start: 2020-08-17 — End: 2020-08-17
  Administered 2020-08-17: 11 via INTRAVENOUS
  Filled 2020-08-17: qty 11

## 2020-08-17 MED ORDER — TECHNETIUM TC 99M TETROFOSMIN IV KIT
31.6000 | PACK | Freq: Once | INTRAVENOUS | Status: AC | PRN
  Administered 2020-08-17: 31.6 via INTRAVENOUS
  Filled 2020-08-17: qty 32

## 2020-08-17 MED ORDER — REGADENOSON 0.4 MG/5ML IV SOLN
0.4000 mg | Freq: Once | INTRAVENOUS | Status: AC
  Administered 2020-08-17: 0.4 mg via INTRAVENOUS

## 2020-08-18 ENCOUNTER — Telehealth: Payer: Self-pay | Admitting: Cardiology

## 2020-08-18 NOTE — Telephone Encounter (Signed)
    Patient Name: Eric Ross  DOB: [birth year] MRN: [id number]  Primary Cardiologist: Quay Burow, MD  Chart reviewed as part of pre-operative protocol coverage. Given past medical history and time since last visit, based on ACC/AHA guidelines, [name] would be at acceptable risk for the planned procedure without further cardiovascular testing.   The patient was advised that if he develops new symptoms prior to surgery to contact our office to arrange for a follow-up visit, and he verbalized understanding. Patient with diagnosis of A Fib on warfarin for anticoagulation.     Procedure: 4 Extractions and 2 crowns with possible implants if pt does not want to go forward with implants he will receive bridges    Date of procedure: TBD     CHA2DS2-VASc Score = 4  This indicates a 4.8% annual risk of stroke. The patient's score is based upon: CHF History: No HTN History: Yes Diabetes History: No Stroke History: Yes Vascular Disease History: No Age Score: 1 Gender Score: 0      CrCl 87 mL/min Platelet count 173K   Patient does not require pre-op antibiotics for dental procedure.   Patient on warfarin with A Fib and history of stroke. Typically recommend to hold for 5 days and bridge with Lovenox.  Dr Gwenlyn Found feels need for coumadin lovenox crossover with hx of Stroke.  Once date is set please contact our pharmacy in office to schedule the crossover .  I will route this recommendation to the requesting party via Epic fax function and remove from pre-op pool.  Please call with questions.  Cecilie Kicks, NP 08/18/2020, 4:09 PM

## 2020-08-22 ENCOUNTER — Telehealth: Payer: Self-pay | Admitting: Cardiology

## 2020-08-22 NOTE — Telephone Encounter (Signed)
Pt and his wife advised his Stress test results and will call back and let us know that date of his dental procedure.

## 2020-08-22 NOTE — Telephone Encounter (Signed)
Patient's wife calling for stress test results. 

## 2020-08-22 NOTE — Telephone Encounter (Signed)
See phone note from Cecilie Kicks, NP 08/18/20 in regards to clearance and lovenox bridging. See phonr note from today 08/22/20 pt states he will call back and let us know when his DDS appt is. Pt will need appt with [facility] to be set up with Lovenox bridging.

## 2020-08-23 ENCOUNTER — Telehealth: Payer: Self-pay

## 2020-08-23 NOTE — Telephone Encounter (Signed)
Pt's wife called in and stated that they have the dental procedure 09/01/20 so I advised them t get inr at the lab tomorrow so we can dose by Friday. Will route this to the n[facility] anticoag pool to make them aware

## 2020-08-24 NOTE — Telephone Encounter (Signed)
I spoke to the patient's wife [name]) and they will come in for INR check and Lovenox Bridge instructions on 7/29 @ 10:00..  She verbalized understanding

## 2020-08-26 ENCOUNTER — Ambulatory Visit (INDEPENDENT_AMBULATORY_CARE_PROVIDER_SITE_OTHER): Payer: Medicare HMO

## 2020-08-26 ENCOUNTER — Other Ambulatory Visit: Payer: Self-pay

## 2020-08-26 DIAGNOSIS — Z7901 Long term (current) use of anticoagulants: Secondary | ICD-10-CM | POA: Diagnosis not present

## 2020-08-26 DIAGNOSIS — Z8673 Personal history of transient ischemic attack (TIA), and cerebral infarction without residual deficits: Secondary | ICD-10-CM

## 2020-08-26 DIAGNOSIS — I4891 Unspecified atrial fibrillation: Secondary | ICD-10-CM

## 2020-08-26 LAB — POCT INR: INR: 4.8 — AB (ref 2.0–3.0)

## 2020-08-26 MED ORDER — ENOXAPARIN SODIUM 80 MG/0.8ML IJ SOSY: 80.0000 mg | PREFILLED_SYRINGE | Freq: Two times a day (BID) | INTRAMUSCULAR | 1 refills | Status: DC

## 2020-08-26 NOTE — Patient Instructions (Signed)
Hold tonight through 8/3 and then Continue with 1 tablet daily except 1/2 tablet each Monday, Wednesday and Friday.  Repeat INR in 2 weeks.   7/29: No Warfarin or enoxaparin (Lovenox)  7/30: No warfarin or enoxaparin (Lovenox).  7/31: Inject enoxaparin '80mg'$  in the fatty abdominal tissue at least 2 inches from the belly button twice a day about 12 hours apart, 8am and 8pm rotate sites. No warfarin.  8/1: Inject enoxaparin in the fatty tissue every 12 hours, 8am and 8pm. No warfarin.  8/2: Inject enoxaparin in the fatty tissue every 12 hours, 8am and 8pm. No warfarin.  8/3: Inject enoxaparin in the fatty tissue in the morning at 8 am (No PM dose). No warfarin.  8/4: Procedure Day - No enoxaparin - Resume warfarin in the evening or as directed by doctor (take an extra half tablet with usual dose for 2 days then resume normal dose).  8/5: Resume enoxaparin inject in the fatty tissue every 12 hours and take warfarin  8/6: Inject enoxaparin in the fatty tissue every 12 hours and take warfarin  8/7: Inject enoxaparin in the fatty tissue every 12 hours and take warfarin  8/8: Inject enoxaparin in the fatty tissue every 12 hours and take warfarin  8/9: Inject enoxaparin in the fatty tissue every 12 hours and take warfarin  8/10: warfarin appt to check INR.

## 2020-08-30 ENCOUNTER — Telehealth: Payer: Self-pay | Admitting: Cardiovascular Disease

## 2020-08-30 NOTE — Telephone Encounter (Signed)
Called back to speak with kristin, was told she is currently with a patient, I have left my cell phone number for her to call me back.

## 2020-08-30 NOTE — Telephone Encounter (Signed)
Follow up:  Dr. Jaquita Rector office calling they have some question. If patient has had Chemotherapy.

## 2020-08-30 NOTE — Telephone Encounter (Signed)
Spoke with Eric Ross, they wanted to know if the patient had chemo or radiation therapy was previous cancer.  I am only able to locate history of thyroidectomy and thyroid cancer.  However neither cardiology note or previous PCP note mentioned if the patient ever had chemo or radiation therapy.  It is unclear to me when the previous thyroid cancer was diagnosed and treated.

## 2020-08-31 ENCOUNTER — Encounter: Payer: Self-pay | Admitting: Family Medicine

## 2020-08-31 ENCOUNTER — Telehealth: Payer: Self-pay | Admitting: Family Medicine

## 2020-08-31 NOTE — Telephone Encounter (Signed)
Received a call form [name], Pt's wife. She states that pt has a dental appt tomorrow, 09/01/2020, to have some teeth pulled. She states that the dental office, [facility] is requesting a letter stating we have no records concerning Chemo or Radiation. I referred to this matter to Beverlee Nims to process. She gave the fax number to [facility] and ask that this letter be sent over ASAP. Per Beverlee Nims no information was located in his paper or electronic chart. Per Diane letter was faxed.

## 2020-09-04 ENCOUNTER — Other Ambulatory Visit: Payer: Self-pay | Admitting: Family Medicine

## 2020-09-04 DIAGNOSIS — E039 Hypothyroidism, unspecified: Secondary | ICD-10-CM

## 2020-09-07 ENCOUNTER — Telehealth: Payer: Self-pay

## 2020-09-07 ENCOUNTER — Other Ambulatory Visit: Payer: Self-pay | Admitting: Cardiovascular Disease

## 2020-09-07 DIAGNOSIS — Z7901 Long term (current) use of anticoagulants: Secondary | ICD-10-CM | POA: Diagnosis not present

## 2020-09-07 LAB — POCT INR: INR: 1.6 — AB (ref 2.0–3.0)

## 2020-09-07 NOTE — Telephone Encounter (Signed)
Unable to lmom for overue ir as all I got was a busy tone

## 2020-09-08 ENCOUNTER — Ambulatory Visit (INDEPENDENT_AMBULATORY_CARE_PROVIDER_SITE_OTHER): Payer: Medicare HMO | Admitting: Cardiology

## 2020-09-08 ENCOUNTER — Other Ambulatory Visit: Payer: Self-pay | Admitting: Family Medicine

## 2020-09-08 DIAGNOSIS — M47817 Spondylosis without myelopathy or radiculopathy, lumbosacral region: Secondary | ICD-10-CM | POA: Diagnosis not present

## 2020-09-08 DIAGNOSIS — M5136 Other intervertebral disc degeneration, lumbar region: Secondary | ICD-10-CM | POA: Diagnosis not present

## 2020-09-08 DIAGNOSIS — G894 Chronic pain syndrome: Secondary | ICD-10-CM | POA: Diagnosis not present

## 2020-09-08 DIAGNOSIS — I4821 Permanent atrial fibrillation: Secondary | ICD-10-CM

## 2020-09-08 DIAGNOSIS — Z7901 Long term (current) use of anticoagulants: Secondary | ICD-10-CM | POA: Diagnosis not present

## 2020-09-08 DIAGNOSIS — G90519 Complex regional pain syndrome I of unspecified upper limb: Secondary | ICD-10-CM | POA: Diagnosis not present

## 2020-09-08 DIAGNOSIS — I10 Essential (primary) hypertension: Secondary | ICD-10-CM

## 2020-09-08 DIAGNOSIS — Z8673 Personal history of transient ischemic attack (TIA), and cerebral infarction without residual deficits: Secondary | ICD-10-CM

## 2020-09-08 LAB — PROTIME-INR
INR: 1.6 — ABNORMAL HIGH (ref 0.9–1.2)
Prothrombin Time: 16.7 s — ABNORMAL HIGH (ref 9.1–12.0)

## 2020-09-09 ENCOUNTER — Telehealth: Payer: Self-pay

## 2020-09-09 NOTE — Telephone Encounter (Signed)
I spoke with pt's wife and gave her Kristin's INR instruction.  They verbalized understanding

## 2020-09-15 ENCOUNTER — Telehealth: Payer: Self-pay

## 2020-09-15 NOTE — Telephone Encounter (Signed)
Lmom for overdue inr 

## 2020-09-16 DIAGNOSIS — I4811 Longstanding persistent atrial fibrillation: Secondary | ICD-10-CM | POA: Diagnosis not present

## 2020-09-16 LAB — POCT INR: INR: 2.2 (ref 2.0–3.0)

## 2020-09-17 LAB — PROTIME-INR
INR: 2.2 — ABNORMAL HIGH (ref 0.9–1.2)
Prothrombin Time: 22.6 s — ABNORMAL HIGH (ref 9.1–12.0)

## 2020-09-19 ENCOUNTER — Ambulatory Visit (INDEPENDENT_AMBULATORY_CARE_PROVIDER_SITE_OTHER): Payer: Medicare HMO | Admitting: Cardiology

## 2020-09-19 DIAGNOSIS — Z7901 Long term (current) use of anticoagulants: Secondary | ICD-10-CM

## 2020-09-19 DIAGNOSIS — Z5181 Encounter for therapeutic drug level monitoring: Secondary | ICD-10-CM | POA: Diagnosis not present

## 2020-09-19 DIAGNOSIS — Z8673 Personal history of transient ischemic attack (TIA), and cerebral infarction without residual deficits: Secondary | ICD-10-CM

## 2020-09-19 DIAGNOSIS — I4821 Permanent atrial fibrillation: Secondary | ICD-10-CM

## 2020-09-19 NOTE — Patient Instructions (Signed)
Description   Spoke with patient's wife and instructed patient to continue taking 1 tablet (5 mg) daily except 1/2 tablet (2.5 mg) each Monday, Wednesday and Friday.  Repeat INR in 1 week

## 2020-09-19 NOTE — Telephone Encounter (Signed)
TRIED TO CALL TO TALK ABOUT COUMADIN RESULTS BUT PHONE [place] [facility]

## 2020-09-27 ENCOUNTER — Telehealth: Payer: Self-pay

## 2020-09-27 NOTE — Telephone Encounter (Signed)
Unable to lmom for overdue inr as they  wouldn't even ring

## 2020-09-30 ENCOUNTER — Ambulatory Visit (INDEPENDENT_AMBULATORY_CARE_PROVIDER_SITE_OTHER): Payer: Medicare HMO

## 2020-09-30 ENCOUNTER — Ambulatory Visit: Payer: Medicare HMO | Admitting: Cardiovascular Disease

## 2020-09-30 ENCOUNTER — Other Ambulatory Visit: Payer: Self-pay

## 2020-09-30 ENCOUNTER — Encounter: Payer: Self-pay | Admitting: Cardiovascular Disease

## 2020-09-30 DIAGNOSIS — E782 Mixed hyperlipidemia: Secondary | ICD-10-CM | POA: Diagnosis not present

## 2020-09-30 DIAGNOSIS — Z8673 Personal history of transient ischemic attack (TIA), and cerebral infarction without residual deficits: Secondary | ICD-10-CM | POA: Diagnosis not present

## 2020-09-30 DIAGNOSIS — I4891 Unspecified atrial fibrillation: Secondary | ICD-10-CM

## 2020-09-30 DIAGNOSIS — Z7901 Long term (current) use of anticoagulants: Secondary | ICD-10-CM

## 2020-09-30 DIAGNOSIS — Z5181 Encounter for therapeutic drug level monitoring: Secondary | ICD-10-CM

## 2020-09-30 DIAGNOSIS — I428 Other cardiomyopathies: Secondary | ICD-10-CM

## 2020-09-30 DIAGNOSIS — I4821 Permanent atrial fibrillation: Secondary | ICD-10-CM

## 2020-09-30 DIAGNOSIS — I10 Essential (primary) hypertension: Secondary | ICD-10-CM | POA: Diagnosis not present

## 2020-09-30 LAB — POCT INR: INR: 1.9 — AB (ref 2.0–3.0)

## 2020-09-30 NOTE — Patient Instructions (Signed)
Take 1 tablet today only and then continue taking 1 tablet (5 mg) daily except 1/2 tablet (2.5 mg) each Monday, Wednesday and Friday.  Repeat INR in 4 weeks

## 2020-09-30 NOTE — Progress Notes (Signed)
09/30/2020 Eric Ross   July 13, 1955  [id number]  Primary Physician Denita Lung, MD Primary Cardiologist: Lorretta Harp MD [name], [place]  HPI:  Eric Ross is a 65 y.o.  thin appearing married Caucasian male father of 2, grandfather and 3 grandchildren who is accompanied by his wife Eric Ross today. I last saw him  08/14/2018... He has a history of nonischemic cardiomyopathy cath performed in 2003 that showed normal coronary arteries. He has an ejection fraction in the 45-50% range. He does have chronic A. Fib on Coumadin anticoagulation rate controlled. His other problems include hyperlipidemia on statin therapy and treated hypertension. He does get occasional chest burning and a Myoview stress test performed 05/30/11 which was normal. Since I saw him a year ago he is relatively asymptomatic. Unfortunately his mother-in-law who is 6 years old lives with them as phone and broken several bones. [name] has been taking care of her while she is living with them.   Since I saw him in the office 2 years ago he has done well.  I did refer him to Dr. Curt Bears because of dizziness who adjusted his carvedilol.  His symptoms improved.  He was having some atypical chest pain and had a recent Myoview stress test performed 08/17/2020 which was nonischemic.  His EF is in the 45% range.  He had dental work performed recently and had Lovenox bridging.    Current Meds  Medication Sig   carvedilol (COREG) 25 MG tablet Take 1.5 tablets (37.5 mg total) by mouth 2 (two) times daily.   HYDROcodone-acetaminophen (NORCO/VICODIN) 5-325 MG tablet Take 1 tablet by mouth 4 (four) times daily as needed (pain).    levothyroxine (SYNTHROID) 112 MCG tablet TAKE 1 TABLET BY MOUTH EVERY DAY   pantoprazole (PROTONIX) 40 MG tablet Take 1 tablet (40 mg total) by mouth daily.   ramipril (ALTACE) 2.5 MG capsule TAKE 1 CAPSULE BY MOUTH EVERY DAY   simvastatin (ZOCOR) 20 MG tablet TAKE 1 TABLET BY MOUTH EVERY  DAY   tadalafil (CIALIS) 20 MG tablet Take 1 tablet (20 mg total) by mouth daily as needed for erectile dysfunction.   warfarin (COUMADIN) 5 MG tablet TAKE 1/2 TO 1 TABLET BY MOUTH DAILY AS DIRECTED BY COUMADIN CLINIC     Allergies  Allergen Reactions   Penicillins     REACTION: Paralyzed him @ 65 yrs old    Social History   Socioeconomic History   Marital status: Married    Spouse name: Not on file   Number of children: Not on file   Years of education: Not on file   Highest education level: Not on file  Occupational History   Not on file  Tobacco Use   Smoking status: Never   Smokeless tobacco: Current    Types: Chew  Substance and Sexual Activity   Alcohol use: No   Drug use: No   Sexual activity: Yes  Other Topics Concern   Not on file  Social History Narrative   Not on file   Social Determinants of Health   Financial Resource Strain: Not on file  Food Insecurity: Not on file  Transportation Needs: Not on file  Physical Activity: Not on file  Stress: Not on file  Social Connections: Not on file  Intimate Partner Violence: Not on file     Review of Systems: General: negative for chills, fever, night sweats or weight changes.  Cardiovascular: negative for chest pain, dyspnea on exertion, edema,  orthopnea, palpitations, paroxysmal nocturnal dyspnea or shortness of breath Dermatological: negative for rash Respiratory: negative for cough or wheezing Urologic: negative for hematuria Abdominal: negative for nausea, vomiting, diarrhea, bright red blood per rectum, melena, or hematemesis Neurologic: negative for visual changes, syncope, or dizziness All other systems reviewed and are otherwise negative except as noted above.    Blood pressure 99/68, pulse 78, height '6\' 1"'$  (1.854 m), weight 181 lb 6.4 oz (82.3 kg), SpO2 95 %.  General appearance: alert and no distress Neck: no adenopathy, no carotid bruit, no JVD, supple, symmetrical, trachea midline, and thyroid  not enlarged, symmetric, no tenderness/mass/nodules Lungs: clear to auscultation bilaterally Heart: irregularly irregular rhythm Extremities: extremities normal, atraumatic, no cyanosis or edema Pulses: 2+ and symmetric Skin: Skin color, texture, turgor normal. No rashes or lesions Neurologic: Grossly normal  EKG not performed today  ASSESSMENT AND PLAN:   Essential hypertension History of essential hypertension a blood pressure measured today at 99/68.  He is on carvedilol and ramipril.  Atrial fibrillation (HCC) History of chronic A. fib rate controlled on Coumadin anticoagulation.  His INRs are checked in our Coumadin clinic in the office.  Hyperlipidemia History of hyperlipidemia on statin therapy with lipid profile performed 10/30/2019 revealing total cholesterol 156, LDL 89 HDL 42.  Nonischemic cardiomyopathy ([place]) History of nonischemic cardiomyopathy with an EF in the 45% range.  I did perform a cardiac catheterization on him in 2003 that showed normal coronary arteries.  He had a recent Myoview stress test performed as well that was nonischemic.  He gets occasional atypical chest pain.     Lorretta Harp MD FACP,FACC,FAHA, [facility] 09/30/2020 10:39 AM

## 2020-09-30 NOTE — Assessment & Plan Note (Signed)
History of essential hypertension a blood pressure measured today at 99/68.  He is on carvedilol and ramipril.

## 2020-09-30 NOTE — Assessment & Plan Note (Signed)
History of chronic A. fib rate controlled on Coumadin anticoagulation.  His INRs are checked in our Coumadin clinic in the office.

## 2020-09-30 NOTE — Assessment & Plan Note (Signed)
History of nonischemic cardiomyopathy with an EF in the 45% range.  I did perform a cardiac catheterization on him in 2003 that showed normal coronary arteries.  He had a recent Myoview stress test performed as well that was nonischemic.  He gets occasional atypical chest pain.

## 2020-09-30 NOTE — Patient Instructions (Signed)
Medication Instructions:  Your physician recommends that you continue on your current medications as directed. Please refer to the Current Medication list given to you today.  *If you need a refill on your cardiac medications before your next appointment, please call your pharmacy*  Follow-Up: At [facility], you and your health needs are our priority.  As part of our continuing mission to provide you with exceptional heart care, we have created designated Provider Care Teams.  These Care Teams include your primary Cardiologist (physician) and Advanced Practice Providers (APPs -  Physician Assistants and Nurse Practitioners) who all work together to provide you with the care you need, when you need it.  We recommend signing up for the patient portal called "MyChart".  Sign up information is provided on this After Visit Summary.  MyChart is used to connect with patients for Virtual Visits (Telemedicine).  Patients are able to view lab/test results, encounter notes, upcoming appointments, etc.  Non-urgent messages can be sent to your provider as well.   To learn more about what you can do with MyChart, go to [email or web address].    Your next appointment:   12 month(s)  The format for your next appointment:   In Person  Provider:   You may see Quay Burow, MD or one of the following Advanced Practice Providers on your designated Care Team:   [place]rin [place], PA-C Coletta Memos, FNP   Other Instructions

## 2020-09-30 NOTE — Assessment & Plan Note (Signed)
History of hyperlipidemia on statin therapy with lipid profile performed 10/30/2019 revealing total cholesterol 156, LDL 89 HDL 42.

## 2020-10-11 ENCOUNTER — Ambulatory Visit: Payer: Medicare HMO | Admitting: Cardiology

## 2020-10-18 DIAGNOSIS — M47817 Spondylosis without myelopathy or radiculopathy, lumbosacral region: Secondary | ICD-10-CM | POA: Diagnosis not present

## 2020-10-18 DIAGNOSIS — M5136 Other intervertebral disc degeneration, lumbar region: Secondary | ICD-10-CM | POA: Diagnosis not present

## 2020-10-18 DIAGNOSIS — G894 Chronic pain syndrome: Secondary | ICD-10-CM | POA: Diagnosis not present

## 2020-10-18 DIAGNOSIS — G90519 Complex regional pain syndrome I of unspecified upper limb: Secondary | ICD-10-CM | POA: Diagnosis not present

## 2020-10-19 ENCOUNTER — Other Ambulatory Visit: Payer: Self-pay | Admitting: Family Medicine

## 2020-10-19 DIAGNOSIS — E7800 Pure hypercholesterolemia, unspecified: Secondary | ICD-10-CM

## 2020-10-27 ENCOUNTER — Other Ambulatory Visit: Payer: Self-pay | Admitting: Cardiovascular Disease

## 2020-10-28 ENCOUNTER — Other Ambulatory Visit: Payer: Self-pay | Admitting: Cardiovascular Disease

## 2020-10-31 ENCOUNTER — Encounter: Payer: Self-pay | Admitting: Family Medicine

## 2020-10-31 ENCOUNTER — Ambulatory Visit (INDEPENDENT_AMBULATORY_CARE_PROVIDER_SITE_OTHER): Payer: Medicare HMO | Admitting: Family Medicine

## 2020-10-31 ENCOUNTER — Other Ambulatory Visit: Payer: Self-pay

## 2020-10-31 VITALS — BP 116/70 | HR 81 | Temp 97.0°F | Ht 73.0 in | Wt 180.2 lb

## 2020-10-31 DIAGNOSIS — E7800 Pure hypercholesterolemia, unspecified: Secondary | ICD-10-CM

## 2020-10-31 DIAGNOSIS — G8929 Other chronic pain: Secondary | ICD-10-CM

## 2020-10-31 DIAGNOSIS — Z8673 Personal history of transient ischemic attack (TIA), and cerebral infarction without residual deficits: Secondary | ICD-10-CM

## 2020-10-31 DIAGNOSIS — K219 Gastro-esophageal reflux disease without esophagitis: Secondary | ICD-10-CM | POA: Diagnosis not present

## 2020-10-31 DIAGNOSIS — Z23 Encounter for immunization: Secondary | ICD-10-CM | POA: Diagnosis not present

## 2020-10-31 DIAGNOSIS — J309 Allergic rhinitis, unspecified: Secondary | ICD-10-CM

## 2020-10-31 DIAGNOSIS — E782 Mixed hyperlipidemia: Secondary | ICD-10-CM | POA: Diagnosis not present

## 2020-10-31 DIAGNOSIS — Z0000 Encounter for general adult medical examination without abnormal findings: Secondary | ICD-10-CM

## 2020-10-31 DIAGNOSIS — I4811 Longstanding persistent atrial fibrillation: Secondary | ICD-10-CM

## 2020-10-31 DIAGNOSIS — M545 Low back pain, unspecified: Secondary | ICD-10-CM | POA: Diagnosis not present

## 2020-10-31 DIAGNOSIS — N529 Male erectile dysfunction, unspecified: Secondary | ICD-10-CM | POA: Diagnosis not present

## 2020-10-31 DIAGNOSIS — Z2821 Immunization not carried out because of patient refusal: Secondary | ICD-10-CM

## 2020-10-31 DIAGNOSIS — E039 Hypothyroidism, unspecified: Secondary | ICD-10-CM

## 2020-10-31 DIAGNOSIS — R4701 Aphasia: Secondary | ICD-10-CM

## 2020-10-31 DIAGNOSIS — I10 Essential (primary) hypertension: Secondary | ICD-10-CM | POA: Diagnosis not present

## 2020-10-31 DIAGNOSIS — I428 Other cardiomyopathies: Secondary | ICD-10-CM | POA: Diagnosis not present

## 2020-10-31 DIAGNOSIS — Z7901 Long term (current) use of anticoagulants: Secondary | ICD-10-CM | POA: Diagnosis not present

## 2020-10-31 LAB — POCT INR: INR: 2.1 (ref 2.0–3.0)

## 2020-10-31 MED ORDER — LEVOTHYROXINE SODIUM 112 MCG PO TABS: 112.0000 ug | ORAL_TABLET | Freq: Every day | ORAL | 3 refills | Status: DC

## 2020-10-31 MED ORDER — TADALAFIL 20 MG PO TABS: 20.0000 mg | ORAL_TABLET | Freq: Every day | ORAL | 5 refills | Status: DC | PRN

## 2020-10-31 MED ORDER — SIMVASTATIN 20 MG PO TABS: 20.0000 mg | ORAL_TABLET | Freq: Every day | ORAL | 3 refills | Status: DC

## 2020-10-31 MED ORDER — RAMIPRIL 2.5 MG PO CAPS: 2.5000 mg | ORAL_CAPSULE | Freq: Every day | ORAL | 3 refills | Status: DC

## 2020-10-31 MED ORDER — CARVEDILOL 25 MG PO TABS: 37.5000 mg | ORAL_TABLET | Freq: Two times a day (BID) | ORAL | 3 refills | Status: DC

## 2020-10-31 MED ORDER — PANTOPRAZOLE SODIUM 40 MG PO TBEC: 40.0000 mg | DELAYED_RELEASE_TABLET | Freq: Every day | ORAL | 3 refills | Status: DC

## 2020-10-31 NOTE — Progress Notes (Signed)
Eric Ross is a 65 y.o. male who presents for annual wellness visit and follow-up on chronic medical conditions.  He has no particular concerns or complaints.  He continues to go to the pain clinic for control of his back pain.  He does have reflux and is on pantoprazole using it fairly regularly.His allergies seem to be under good control.  He is followed regularly by cardiology for his underlying atrial fibs as well as Coumadin clinic.  He continues on Coreg, Altace.  Taking simvastatin for his lipids.  He continues on his thyroid medicine is having no difficulty with that.  He would like a refill on his Cialis.  He is now on regular Medicare.  His wife is now also retired.  Immunizations and Health Maintenance Immunization History  Administered Date(s) Administered   Influenza Inj Mdck Quad Pf 10/19/2016, 10/09/2017   Influenza Split 10/04/2014   Influenza, Seasonal, Injecte, Preservative Fre 10/09/2017   Influenza,inj,Quad PF,6+ Mos 11/21/2018   Influenza-Unspecified 10/22/2015   Td 04/26/2006   Health Maintenance Due  Topic Date Due   COVID-19 Vaccine (1) Never done   HIV Screening  Never done   Zoster Vaccines- Shingrix (1 of 2) Never done   Fecal DNA (Cologuard)  Never done   TETANUS/TDAP  04/25/2016   INFLUENZA VACCINE  08/29/2020    Last colonoscopy:  cologuard 09/26/18 Last PSA: never Dentist: Q year Ophtho: over two years Exercise: N/A  Other doctors caring for patient include: Dr. Gwenlyn Found cardiologist   Advanced Directives: Does Patient Have a Medical Advance Directive?: No Would patient like information on creating a medical advance directive?: Yes (ED - Information included in AVS)  Depression screen:  See questionnaire below.     Depression screen [facility] 2/9 10/31/2020 10/30/2019 07/22/2019 08/08/2018 11/27/2017  Decreased Interest 0 0 0 0 0  Down, Depressed, Hopeless 0 0 0 0 0  PHQ - 2 Score 0 0 0 0 0    Fall Screen: See Questionaire below.   Fall Risk   10/31/2020 10/30/2019 08/08/2018 11/27/2017  Falls in the past year? 1 1 1  No  Number falls in past yr: 1 1 1  -  Injury with Fall? 0 0 1 -  Risk for fall due to : Impaired balance/gait Impaired balance/gait - -  Follow up Falls evaluation completed - - -    ADL screen:  See questionnaire below.  Functional Status Survey: Is the patient deaf or have difficulty hearing?: No Does the patient have difficulty seeing, even when wearing glasses/contacts?: No Does the patient have difficulty concentrating, remembering, or making decisions?: Yes (remebering) Does the patient have difficulty walking or climbing stairs?: Yes Does the patient have difficulty dressing or bathing?: No Does the patient have difficulty doing errands alone such as visiting a doctor's office or shopping?: Yes   Review of Systems  Constitutional: -, -unexpected weight change, -anorexia, -fatigue Allergy: -sneezing, -itching, -congestion Dermatology: denies changing moles, rash, lumps ENT: -runny nose, -ear pain, -sore throat,  Cardiology:  -chest pain, -palpitations, -orthopnea, Respiratory: -cough, -shortness of breath, -dyspnea on exertion, -wheezing,  Gastroenterology: -abdominal pain, -nausea, -vomiting, -diarrhea, -constipation, -dysphagia Hematology: -bleeding or bruising problems Musculoskeletal: -arthralgias, -myalgias, -joint swelling, -back pain, - Ophthalmology: -vision changes,  Urology: -dysuria, -difficulty urinating,  -urinary frequency, -urgency, incontinence Neurology: -, -numbness, , -memory loss, -falls, -dizziness    PHYSICAL EXAM: General Appearance: Alert, cooperative, no distress, appears stated age Head: Normocephalic, without obvious abnormality, atraumatic Eyes: PERRL, conjunctiva/corneas clear, EOM's intact, f Ears: Normal  TM's and external ear canals Nose: Nares normal, mucosa normal, no drainage or sinus   tenderness Throat: Lips, mucosa, and tongue normal; teeth and gums normal Neck:  Supple, no lymphadenopathy, thyroid:no enlargement/tenderness/nodules; no carotid bruit or JVD Lungs: Clear to auscultation bilaterally without wheezes, rales or ronchi; respirations unlabored Heart: Irregular rate noted S1 and S2 normal, no murmur, rub or gallop Abdomen: Soft, non-tender, nondistended, normoactive bowel sounds, no masses, no hepatosplenomegaly Extremities: No clubbing, cyanosis or edema Pulses: 2+ and symmetric all extremities Skin: Skin color, texture, turgor normal, no rashes or lesions Lymph nodes: Cervical, supraclavicular, and axillary nodes normal Neurologic: CNII-XII intact, normal strength, sensation and gait; reflexes 2+ and symmetric throughout   Psych: Normal mood, affect, hygiene and grooming  ASSESSMENT/PLAN: Gastroesophageal reflux disease, unspecified whether esophagitis present - Plan: pantoprazole (PROTONIX) 40 MG tablet  Need for pneumococcal vaccination - Plan: Pneumococcal conjugate vaccine 20-valent (Prevnar 20)  Chronic low back pain without sciatica, unspecified back pain laterality  Nonischemic cardiomyopathy (HCC) - Plan: carvedilol (COREG) 25 MG tablet, ramipril (ALTACE) 2.5 MG capsule  Expressive aphasia  Hypothyroidism, unspecified type - Plan: TSH, levothyroxine (SYNTHROID) 112 MCG tablet  Mixed hyperlipidemia - Plan: Lipid panel, simvastatin (ZOCOR) 20 MG tablet  History of CVA (cerebrovascular accident)  Long term current use of anticoagulant therapy - Plan: Protime-INR  Erectile dysfunction, unspecified erectile dysfunction type - Plan: tadalafil (CIALIS) 20 MG tablet  Longstanding persistent atrial fibrillation ([place]) - 2003 - Plan: Protime-INR  Immunization refused - Refused shingles, COVID and flu shot  Gastroesophageal reflux disease - Plan: pantoprazole (PROTONIX) 40 MG tablet  Essential hypertension - Plan: CBC with Differential/Platelet, Comprehensive metabolic panel, ramipril (ALTACE) 2.5 MG capsule  Pure  hypercholesterolemia - Plan: Lipid panel, simvastatin (ZOCOR) 20 MG tablet  Allergic rhinitis, unspecified seasonality, unspecified trigger He will continue on his present medication regimen and follow-up with cardiology as well as the pain clinic.  Immunization recommendations discussed.  He does not want the shingles, COVID or flu shot.  Colonoscopy recommendations reviewed.   Medicare Attestation I have personally reviewed: The patient's medical and social history Their use of alcohol, tobacco or illicit drugs Their current medications and supplements The patient's functional ability including ADLs,fall risks, home safety risks, cognitive, and hearing and visual impairment Diet and physical activities Evidence for depression or mood disorders  The patient's weight, height, and BMI have been recorded in the chart.  I have made referrals, counseling, and provided education to the patient based on review of the above and I have provided the patient with a written personalized care plan for preventive services.     Jill Alexanders, MD   10/31/2020

## 2020-11-01 ENCOUNTER — Ambulatory Visit (INDEPENDENT_AMBULATORY_CARE_PROVIDER_SITE_OTHER): Payer: Medicare HMO | Admitting: Cardiovascular Disease

## 2020-11-01 DIAGNOSIS — Z7901 Long term (current) use of anticoagulants: Secondary | ICD-10-CM

## 2020-11-01 DIAGNOSIS — Z8673 Personal history of transient ischemic attack (TIA), and cerebral infarction without residual deficits: Secondary | ICD-10-CM

## 2020-11-01 LAB — COMPREHENSIVE METABOLIC PANEL
ALT: 10 IU/L (ref 0–44)
AST: 20 IU/L (ref 0–40)
Albumin/Globulin Ratio: 2 (ref 1.2–2.2)
Albumin: 4.9 g/dL — ABNORMAL HIGH (ref 3.8–4.8)
Alkaline Phosphatase: 48 IU/L (ref 44–121)
BUN/Creatinine Ratio: 14 (ref 10–24)
BUN: 18 mg/dL (ref 8–27)
Bilirubin Total: 0.3 mg/dL (ref 0.0–1.2)
CO2: 19 mmol/L — ABNORMAL LOW (ref 20–29)
Calcium: 9.5 mg/dL (ref 8.6–10.2)
Chloride: 105 mmol/L (ref 96–106)
Creatinine, Ser: 1.26 mg/dL (ref 0.76–1.27)
Globulin, Total: 2.5 g/dL (ref 1.5–4.5)
Glucose: 103 mg/dL — ABNORMAL HIGH (ref 70–99)
Potassium: 4.2 mmol/L (ref 3.5–5.2)
Sodium: 141 mmol/L (ref 134–144)
Total Protein: 7.4 g/dL (ref 6.0–8.5)
eGFR: 63 mL/min/{1.73_m2} (ref >59)

## 2020-11-01 LAB — LIPID PANEL
Chol/HDL Ratio: 3.3 ratio (ref 0.0–5.0)
Cholesterol, Total: 156 mg/dL (ref 100–199)
HDL: 47 mg/dL (ref >39)
LDL Chol Calc (NIH): 83 mg/dL (ref 0–99)
Triglycerides: 149 mg/dL (ref 0–149)
VLDL Cholesterol Cal: 26 mg/dL (ref 5–40)

## 2020-11-01 LAB — CBC WITH DIFFERENTIAL/PLATELET
Basophils Absolute: 0.1 10*3/uL (ref 0.0–0.2)
Basos: 1 %
EOS (ABSOLUTE): 0.4 10*3/uL (ref 0.0–0.4)
Eos: 5 %
Hematocrit: 39.3 % (ref 37.5–51.0)
Hemoglobin: 13 g/dL (ref 13.0–17.7)
Immature Grans (Abs): 0 10*3/uL (ref 0.0–0.1)
Immature Granulocytes: 0 %
Lymphocytes Absolute: 1.7 10*3/uL (ref 0.7–3.1)
Lymphs: 22 %
MCH: 28.4 pg (ref 26.6–33.0)
MCHC: 33.1 g/dL (ref 31.5–35.7)
MCV: 86 fL (ref 79–97)
Monocytes Absolute: 0.6 10*3/uL (ref 0.1–0.9)
Monocytes: 8 %
Neutrophils Absolute: 4.8 10*3/uL (ref 1.4–7.0)
Neutrophils: 64 %
Platelets: 173 10*3/uL (ref 150–450)
RBC: 4.57 x10E6/uL (ref 4.14–5.80)
RDW: 12.8 % (ref 11.6–15.4)
WBC: 7.5 10*3/uL (ref 3.4–10.8)

## 2020-11-01 LAB — TSH: TSH: 1.38 u[IU]/mL (ref 0.450–4.500)

## 2020-11-01 LAB — PROTIME-INR
INR: 2.1 — ABNORMAL HIGH (ref 0.9–1.2)
Prothrombin Time: 21 s — ABNORMAL HIGH (ref 9.1–12.0)

## 2020-11-01 NOTE — Patient Instructions (Signed)
Description   Take 1 tablet today only and then continue taking 1 tablet (5 mg) daily except 1/2 tablet (2.5 mg) each Monday, Wednesday and Friday.  Repeat INR in 4 weeks

## 2020-11-16 DIAGNOSIS — Z79899 Other long term (current) drug therapy: Secondary | ICD-10-CM | POA: Diagnosis not present

## 2020-11-16 DIAGNOSIS — M47817 Spondylosis without myelopathy or radiculopathy, lumbosacral region: Secondary | ICD-10-CM | POA: Diagnosis not present

## 2020-11-16 DIAGNOSIS — G894 Chronic pain syndrome: Secondary | ICD-10-CM | POA: Diagnosis not present

## 2020-11-16 DIAGNOSIS — Z79891 Long term (current) use of opiate analgesic: Secondary | ICD-10-CM | POA: Diagnosis not present

## 2020-11-16 DIAGNOSIS — G90519 Complex regional pain syndrome I of unspecified upper limb: Secondary | ICD-10-CM | POA: Diagnosis not present

## 2020-11-16 DIAGNOSIS — M5136 Other intervertebral disc degeneration, lumbar region: Secondary | ICD-10-CM | POA: Diagnosis not present

## 2020-12-05 ENCOUNTER — Telehealth: Payer: Self-pay

## 2020-12-05 NOTE — Telephone Encounter (Signed)
I spoke to the patient's wife and they will try to check PT/INR this week at lab.

## 2020-12-05 NOTE — Telephone Encounter (Signed)
Lpm to see if they were able to get INR drawn.

## 2020-12-07 ENCOUNTER — Other Ambulatory Visit: Payer: Self-pay

## 2020-12-07 DIAGNOSIS — I4891 Unspecified atrial fibrillation: Secondary | ICD-10-CM

## 2020-12-07 DIAGNOSIS — Z8673 Personal history of transient ischemic attack (TIA), and cerebral infarction without residual deficits: Secondary | ICD-10-CM | POA: Diagnosis not present

## 2020-12-07 DIAGNOSIS — Z7901 Long term (current) use of anticoagulants: Secondary | ICD-10-CM

## 2020-12-07 LAB — PROTIME-INR: INR: 2.6 — AB (ref 0.9–1.1)

## 2020-12-08 ENCOUNTER — Ambulatory Visit (INDEPENDENT_AMBULATORY_CARE_PROVIDER_SITE_OTHER): Payer: Medicare HMO | Admitting: Cardiovascular Disease

## 2020-12-08 DIAGNOSIS — Z7901 Long term (current) use of anticoagulants: Secondary | ICD-10-CM | POA: Diagnosis not present

## 2020-12-08 DIAGNOSIS — Z8673 Personal history of transient ischemic attack (TIA), and cerebral infarction without residual deficits: Secondary | ICD-10-CM

## 2020-12-08 LAB — PROTIME-INR
INR: 2.6 — ABNORMAL HIGH (ref 0.9–1.2)
Prothrombin Time: 26.2 s — ABNORMAL HIGH (ref 9.1–12.0)

## 2020-12-14 DIAGNOSIS — M25532 Pain in left wrist: Secondary | ICD-10-CM | POA: Diagnosis not present

## 2020-12-14 DIAGNOSIS — M545 Low back pain, unspecified: Secondary | ICD-10-CM | POA: Diagnosis not present

## 2020-12-14 DIAGNOSIS — M5136 Other intervertebral disc degeneration, lumbar region: Secondary | ICD-10-CM | POA: Diagnosis not present

## 2020-12-14 DIAGNOSIS — Z79899 Other long term (current) drug therapy: Secondary | ICD-10-CM | POA: Diagnosis not present

## 2020-12-14 DIAGNOSIS — M47817 Spondylosis without myelopathy or radiculopathy, lumbosacral region: Secondary | ICD-10-CM | POA: Diagnosis not present

## 2020-12-14 DIAGNOSIS — G894 Chronic pain syndrome: Secondary | ICD-10-CM | POA: Diagnosis not present

## 2020-12-14 DIAGNOSIS — G90519 Complex regional pain syndrome I of unspecified upper limb: Secondary | ICD-10-CM | POA: Diagnosis not present

## 2020-12-14 DIAGNOSIS — Z79891 Long term (current) use of opiate analgesic: Secondary | ICD-10-CM | POA: Diagnosis not present

## 2021-01-05 ENCOUNTER — Telehealth: Payer: Self-pay

## 2021-01-05 NOTE — Telephone Encounter (Signed)
I called patient and spoke to wife, reminding them to have INR lab drawn.

## 2021-01-09 ENCOUNTER — Other Ambulatory Visit: Payer: Self-pay

## 2021-01-09 DIAGNOSIS — Z8673 Personal history of transient ischemic attack (TIA), and cerebral infarction without residual deficits: Secondary | ICD-10-CM

## 2021-01-09 DIAGNOSIS — I4891 Unspecified atrial fibrillation: Secondary | ICD-10-CM | POA: Diagnosis not present

## 2021-01-09 DIAGNOSIS — Z7901 Long term (current) use of anticoagulants: Secondary | ICD-10-CM

## 2021-01-09 LAB — PROTIME-INR
INR: 2.1 — AB (ref <=1.1)
INR: 2.1 — ABNORMAL HIGH (ref 0.9–1.2)
Prothrombin Time: 21.6 s — ABNORMAL HIGH (ref 9.1–12.0)

## 2021-01-11 ENCOUNTER — Ambulatory Visit (INDEPENDENT_AMBULATORY_CARE_PROVIDER_SITE_OTHER): Payer: Medicare HMO | Admitting: Cardiology

## 2021-01-11 ENCOUNTER — Other Ambulatory Visit: Payer: Self-pay

## 2021-01-11 DIAGNOSIS — Z7901 Long term (current) use of anticoagulants: Secondary | ICD-10-CM

## 2021-01-11 DIAGNOSIS — I4891 Unspecified atrial fibrillation: Secondary | ICD-10-CM

## 2021-01-11 DIAGNOSIS — Z8673 Personal history of transient ischemic attack (TIA), and cerebral infarction without residual deficits: Secondary | ICD-10-CM

## 2021-01-13 DIAGNOSIS — M47817 Spondylosis without myelopathy or radiculopathy, lumbosacral region: Secondary | ICD-10-CM | POA: Diagnosis not present

## 2021-01-13 DIAGNOSIS — G90519 Complex regional pain syndrome I of unspecified upper limb: Secondary | ICD-10-CM | POA: Diagnosis not present

## 2021-01-13 DIAGNOSIS — M545 Low back pain, unspecified: Secondary | ICD-10-CM | POA: Diagnosis not present

## 2021-01-13 DIAGNOSIS — M5136 Other intervertebral disc degeneration, lumbar region: Secondary | ICD-10-CM | POA: Diagnosis not present

## 2021-01-27 ENCOUNTER — Other Ambulatory Visit: Payer: Self-pay | Admitting: Cardiovascular Disease

## 2021-02-09 DIAGNOSIS — I4891 Unspecified atrial fibrillation: Secondary | ICD-10-CM | POA: Diagnosis not present

## 2021-02-09 DIAGNOSIS — Z8673 Personal history of transient ischemic attack (TIA), and cerebral infarction without residual deficits: Secondary | ICD-10-CM | POA: Diagnosis not present

## 2021-02-09 DIAGNOSIS — Z7901 Long term (current) use of anticoagulants: Secondary | ICD-10-CM | POA: Diagnosis not present

## 2021-02-10 ENCOUNTER — Ambulatory Visit (INDEPENDENT_AMBULATORY_CARE_PROVIDER_SITE_OTHER): Payer: Medicare HMO | Admitting: Cardiology

## 2021-02-10 DIAGNOSIS — Z8673 Personal history of transient ischemic attack (TIA), and cerebral infarction without residual deficits: Secondary | ICD-10-CM

## 2021-02-10 DIAGNOSIS — Z7901 Long term (current) use of anticoagulants: Secondary | ICD-10-CM

## 2021-02-10 LAB — PROTIME-INR
INR: 2.2 — ABNORMAL HIGH (ref 0.9–1.2)
Prothrombin Time: 22.6 s — ABNORMAL HIGH (ref 9.1–12.0)

## 2021-02-21 DIAGNOSIS — Z79891 Long term (current) use of opiate analgesic: Secondary | ICD-10-CM | POA: Diagnosis not present

## 2021-02-21 DIAGNOSIS — G90519 Complex regional pain syndrome I of unspecified upper limb: Secondary | ICD-10-CM | POA: Diagnosis not present

## 2021-02-21 DIAGNOSIS — M47817 Spondylosis without myelopathy or radiculopathy, lumbosacral region: Secondary | ICD-10-CM | POA: Diagnosis not present

## 2021-02-21 DIAGNOSIS — M5136 Other intervertebral disc degeneration, lumbar region: Secondary | ICD-10-CM | POA: Diagnosis not present

## 2021-02-21 DIAGNOSIS — Z79899 Other long term (current) drug therapy: Secondary | ICD-10-CM | POA: Diagnosis not present

## 2021-02-21 DIAGNOSIS — G894 Chronic pain syndrome: Secondary | ICD-10-CM | POA: Diagnosis not present

## 2021-03-02 DIAGNOSIS — D6869 Other thrombophilia: Secondary | ICD-10-CM | POA: Diagnosis not present

## 2021-03-02 DIAGNOSIS — E039 Hypothyroidism, unspecified: Secondary | ICD-10-CM | POA: Diagnosis not present

## 2021-03-02 DIAGNOSIS — G629 Polyneuropathy, unspecified: Secondary | ICD-10-CM | POA: Diagnosis not present

## 2021-03-02 DIAGNOSIS — G8929 Other chronic pain: Secondary | ICD-10-CM | POA: Diagnosis not present

## 2021-03-02 DIAGNOSIS — N529 Male erectile dysfunction, unspecified: Secondary | ICD-10-CM | POA: Diagnosis not present

## 2021-03-02 DIAGNOSIS — E785 Hyperlipidemia, unspecified: Secondary | ICD-10-CM | POA: Diagnosis not present

## 2021-03-02 DIAGNOSIS — M1990 Unspecified osteoarthritis, unspecified site: Secondary | ICD-10-CM | POA: Diagnosis not present

## 2021-03-02 DIAGNOSIS — K219 Gastro-esophageal reflux disease without esophagitis: Secondary | ICD-10-CM | POA: Diagnosis not present

## 2021-03-02 DIAGNOSIS — I2510 Atherosclerotic heart disease of native coronary artery without angina pectoris: Secondary | ICD-10-CM | POA: Diagnosis not present

## 2021-03-02 DIAGNOSIS — I69351 Hemiplegia and hemiparesis following cerebral infarction affecting right dominant side: Secondary | ICD-10-CM | POA: Diagnosis not present

## 2021-03-02 DIAGNOSIS — I10 Essential (primary) hypertension: Secondary | ICD-10-CM | POA: Diagnosis not present

## 2021-03-02 DIAGNOSIS — I4891 Unspecified atrial fibrillation: Secondary | ICD-10-CM | POA: Diagnosis not present

## 2021-03-09 DIAGNOSIS — M1711 Unilateral primary osteoarthritis, right knee: Secondary | ICD-10-CM | POA: Diagnosis not present

## 2021-03-13 ENCOUNTER — Telehealth: Payer: Self-pay

## 2021-03-13 NOTE — Telephone Encounter (Signed)
I spoke to patient's wife and reminded pt to get INR lab draw this week.  Verbalized understanding

## 2021-03-14 ENCOUNTER — Other Ambulatory Visit: Payer: Self-pay

## 2021-03-14 DIAGNOSIS — Z8673 Personal history of transient ischemic attack (TIA), and cerebral infarction without residual deficits: Secondary | ICD-10-CM

## 2021-03-14 DIAGNOSIS — I4891 Unspecified atrial fibrillation: Secondary | ICD-10-CM | POA: Diagnosis not present

## 2021-03-14 DIAGNOSIS — I10 Essential (primary) hypertension: Secondary | ICD-10-CM

## 2021-03-14 DIAGNOSIS — Z7901 Long term (current) use of anticoagulants: Secondary | ICD-10-CM

## 2021-03-15 ENCOUNTER — Ambulatory Visit (INDEPENDENT_AMBULATORY_CARE_PROVIDER_SITE_OTHER): Payer: Medicare HMO | Admitting: Cardiology

## 2021-03-15 DIAGNOSIS — Z7901 Long term (current) use of anticoagulants: Secondary | ICD-10-CM

## 2021-03-15 DIAGNOSIS — Z8673 Personal history of transient ischemic attack (TIA), and cerebral infarction without residual deficits: Secondary | ICD-10-CM

## 2021-03-15 LAB — PROTIME-INR
INR: 3 — ABNORMAL HIGH (ref 0.9–1.2)
Prothrombin Time: 29.9 s — ABNORMAL HIGH (ref 9.1–12.0)

## 2021-03-21 DIAGNOSIS — M25532 Pain in left wrist: Secondary | ICD-10-CM | POA: Diagnosis not present

## 2021-03-21 DIAGNOSIS — M545 Low back pain, unspecified: Secondary | ICD-10-CM | POA: Diagnosis not present

## 2021-03-21 DIAGNOSIS — G90519 Complex regional pain syndrome I of unspecified upper limb: Secondary | ICD-10-CM | POA: Diagnosis not present

## 2021-03-21 DIAGNOSIS — M47817 Spondylosis without myelopathy or radiculopathy, lumbosacral region: Secondary | ICD-10-CM | POA: Diagnosis not present

## 2021-03-21 DIAGNOSIS — M5136 Other intervertebral disc degeneration, lumbar region: Secondary | ICD-10-CM | POA: Diagnosis not present

## 2021-04-06 DIAGNOSIS — S83241A Other tear of medial meniscus, current injury, right knee, initial encounter: Secondary | ICD-10-CM | POA: Diagnosis not present

## 2021-04-13 ENCOUNTER — Other Ambulatory Visit: Payer: Self-pay

## 2021-04-13 ENCOUNTER — Telehealth: Payer: Self-pay

## 2021-04-13 DIAGNOSIS — M25561 Pain in right knee: Secondary | ICD-10-CM | POA: Diagnosis not present

## 2021-04-13 DIAGNOSIS — I4891 Unspecified atrial fibrillation: Secondary | ICD-10-CM | POA: Diagnosis not present

## 2021-04-13 NOTE — Telephone Encounter (Signed)
Lpm to have INR drawn at the lab. ?

## 2021-04-14 ENCOUNTER — Ambulatory Visit (INDEPENDENT_AMBULATORY_CARE_PROVIDER_SITE_OTHER): Payer: Medicare HMO | Admitting: Internal Medicine

## 2021-04-14 DIAGNOSIS — Z8673 Personal history of transient ischemic attack (TIA), and cerebral infarction without residual deficits: Secondary | ICD-10-CM

## 2021-04-14 DIAGNOSIS — Z7901 Long term (current) use of anticoagulants: Secondary | ICD-10-CM | POA: Diagnosis not present

## 2021-04-14 LAB — PROTIME-INR
INR: 2.4 — ABNORMAL HIGH (ref 0.9–1.2)
Prothrombin Time: 24.6 s — ABNORMAL HIGH (ref 9.1–12.0)

## 2021-04-14 IMAGING — CT CT ABD-PELV W/ CM
2 of 5 series · 16 of 46 positions shown, 18 images · IV contrast (Omnipaque)
Comparison: None.

CLINICAL DATA: Pain, nausea, vomiting, diverticulitis suspected

EXAM:
CT ABDOMEN AND PELVIS WITH CONTRAST
TECHNIQUE: Multidetector CT imaging of the abdomen and pelvis was performed
using the standard protocol following bolus administration of
intravenous contrast.
CONTRAST:  100mL OMNIPAQUE IOHEXOL 300 MG/ML SOLN, additional oral
enteric contrast

[Series 2: axial st · axial · 0.75mm/px · z∈[+180,+620]mm · 13 of 100 slices shown, 15 images]
[im 6/100  soft-tissue]
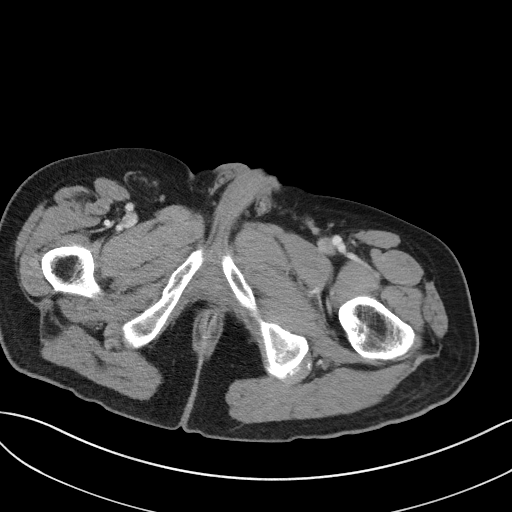
[im 6/100  bone]
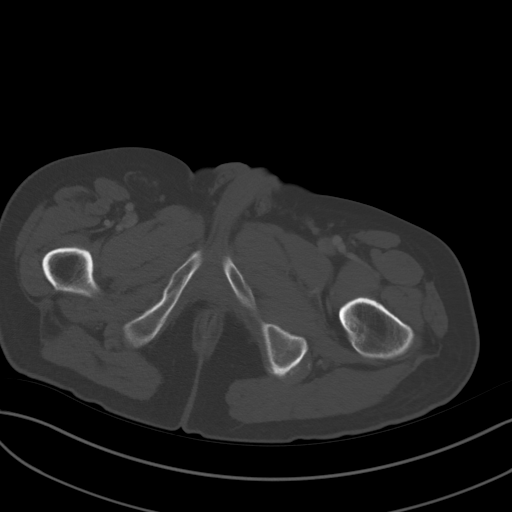
[im 12/100  soft-tissue]
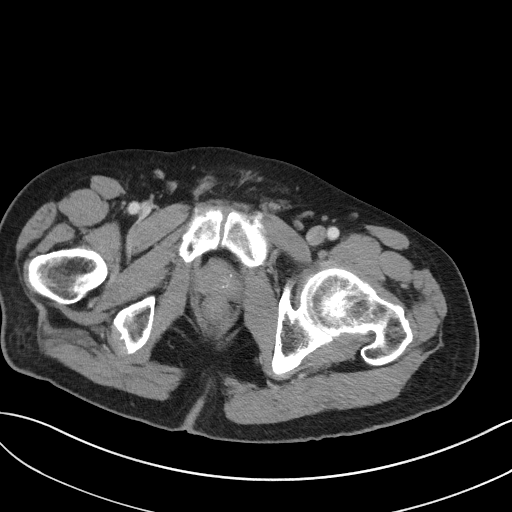
[im 23/100  soft-tissue]
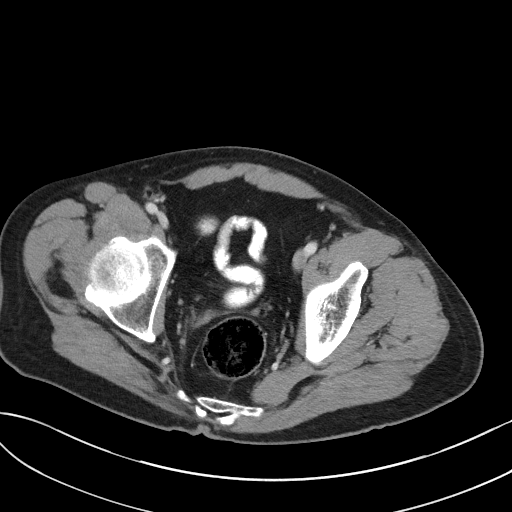
[im 28/100  soft-tissue]
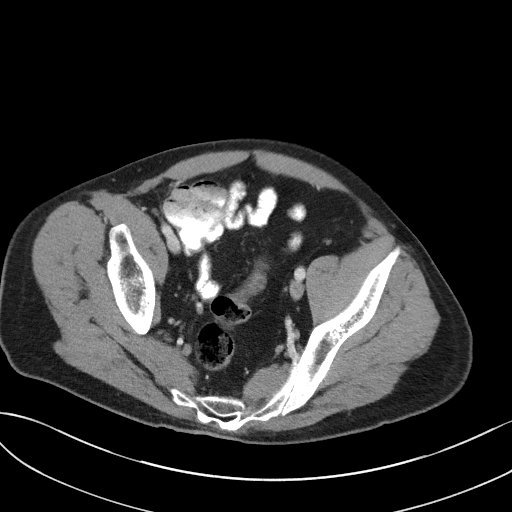
[im 34/100  soft-tissue]
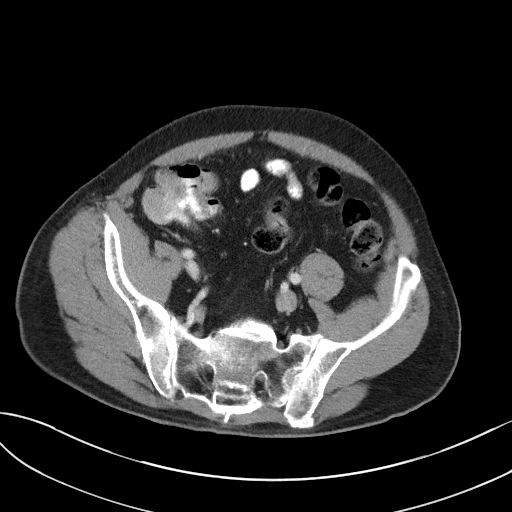
[im 45/100  soft-tissue]
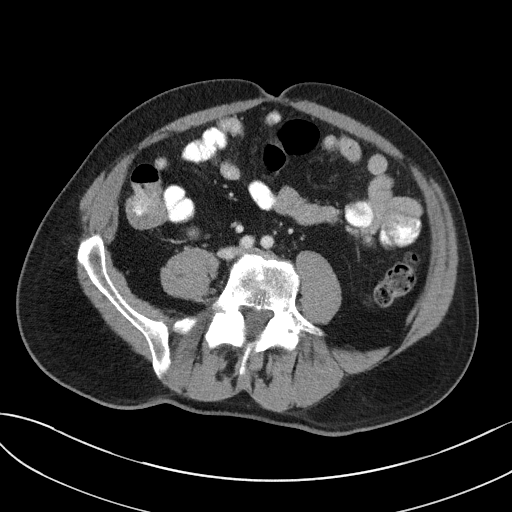
[im 50/100  soft-tissue]
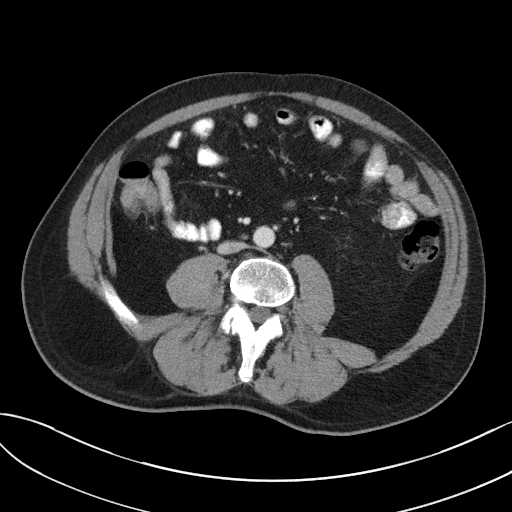
[im 56/100  soft-tissue]
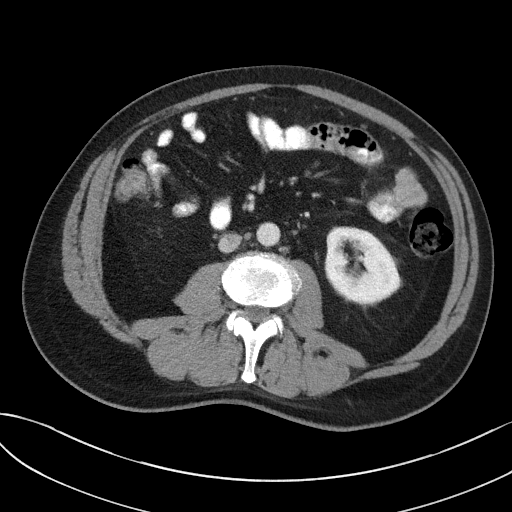
[im 67/100  soft-tissue]
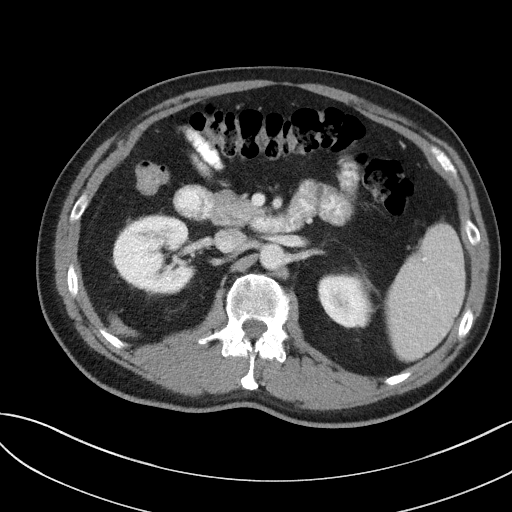
[im 67/100  bone]
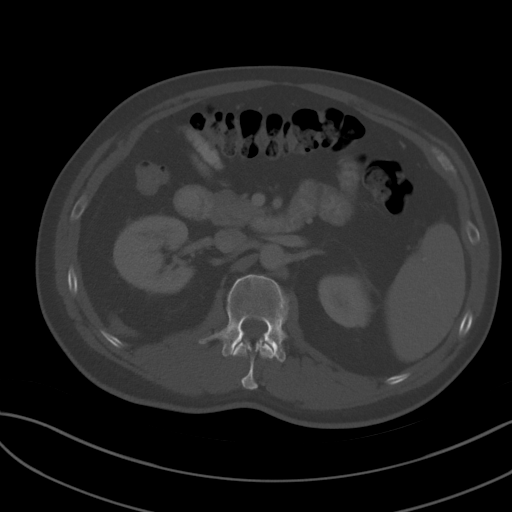
[im 72/100  soft-tissue]
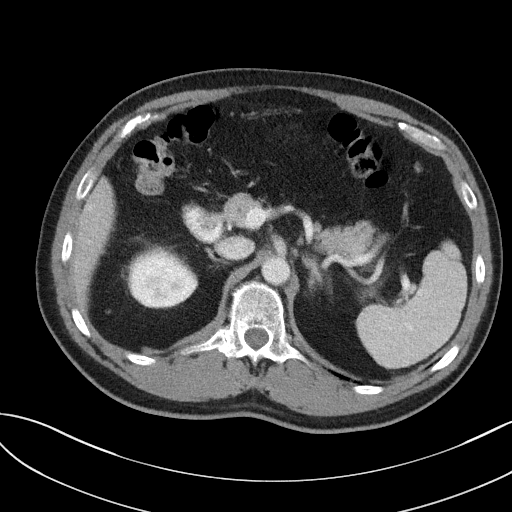
[im 78/100  soft-tissue]
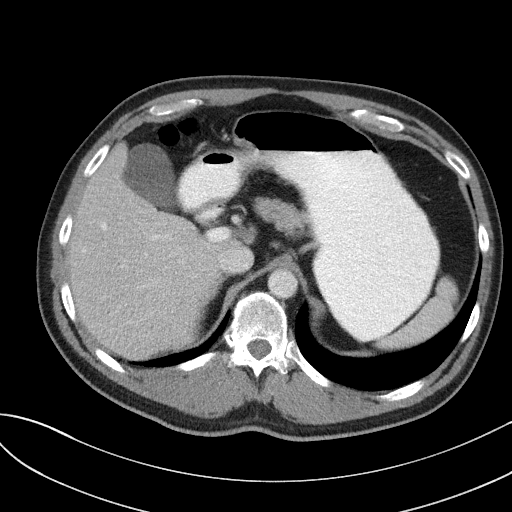
[im 89/100  soft-tissue]
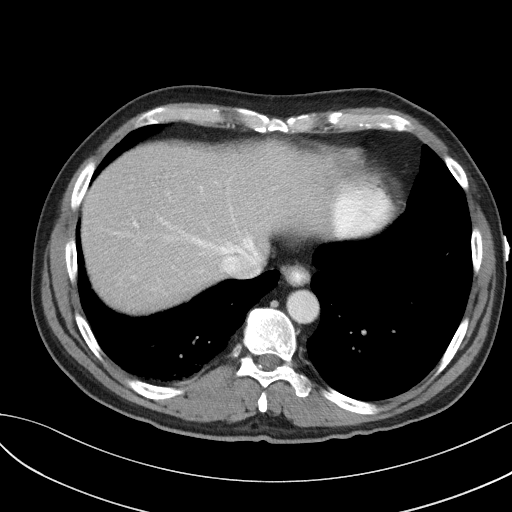
[im 94/100  soft-tissue]
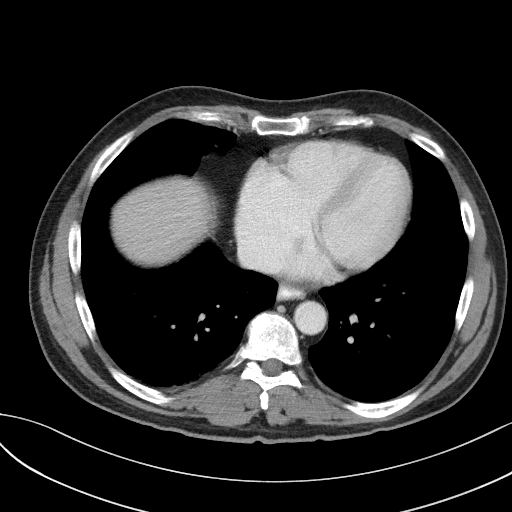

[Series 5: coronal st · coronal · 0.75mm/px · 3 of 100 slices shown]
[im 34/100  soft-tissue]
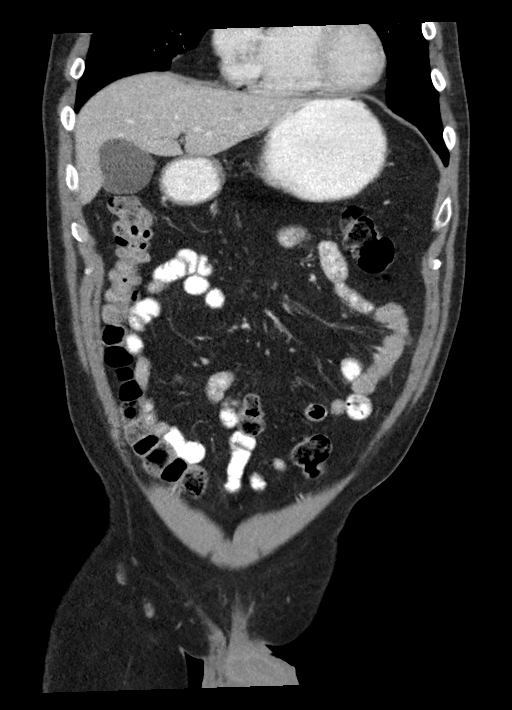
[im 45/100  soft-tissue]
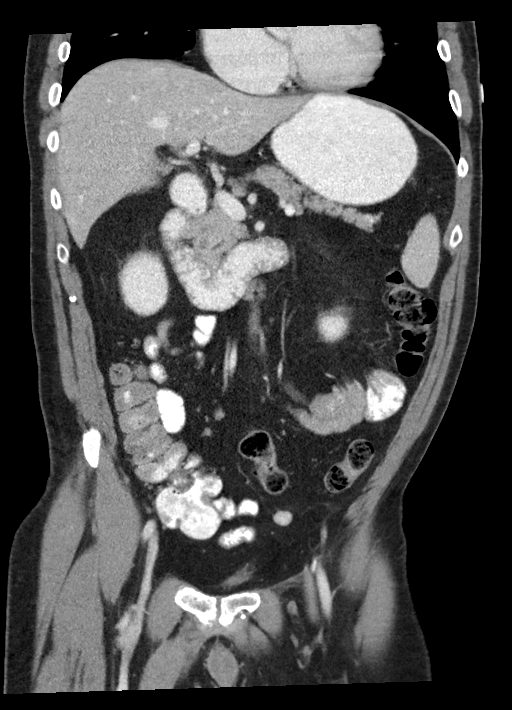
[im 56/100  soft-tissue]
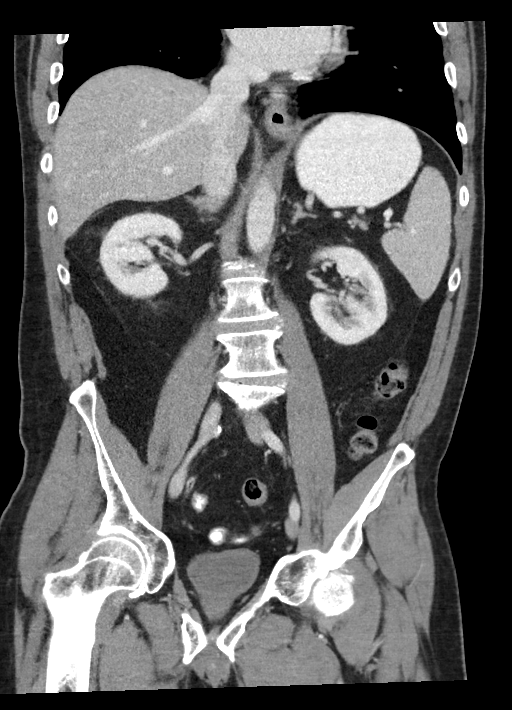

[16 of 46 positions shown; findings below may reference images not displayed]

FINDINGS: Lower chest: No acute abnormality.  Scarring of the right lung base.

Hepatobiliary: No solid liver abnormality is seen. No gallstones,
gallbladder wall thickening, or biliary dilatation.

Pancreas: Unremarkable. No pancreatic ductal dilatation or
surrounding inflammatory changes.

Spleen: Normal in size without significant abnormality.

Adrenals/Urinary Tract: Adrenal glands are unremarkable. Kidneys are
normal, without renal calculi, solid lesion, or hydronephrosis.
Bladder is unremarkable.

Stomach/Bowel: Stomach is within normal limits. Appendix appears
normal. No evidence of bowel wall thickening, distention, or
inflammatory changes.

Vascular/Lymphatic: No significant vascular findings are present. No
enlarged abdominal or pelvic lymph nodes.

Reproductive: No mass or other significant abnormality.

Other: No abdominal wall hernia or abnormality. No abdominopelvic
ascites.

Musculoskeletal: No acute or significant osseous findings.
IMPRESSION: 1. No CT findings of the abdomen or pelvis to explain pain, nausea,
or vomiting. No significant diverticular disease.
2. Normal appendix.

## 2021-04-18 DIAGNOSIS — M25532 Pain in left wrist: Secondary | ICD-10-CM | POA: Diagnosis not present

## 2021-04-18 DIAGNOSIS — M545 Low back pain, unspecified: Secondary | ICD-10-CM | POA: Diagnosis not present

## 2021-04-18 DIAGNOSIS — Z79899 Other long term (current) drug therapy: Secondary | ICD-10-CM | POA: Diagnosis not present

## 2021-04-18 DIAGNOSIS — M5136 Other intervertebral disc degeneration, lumbar region: Secondary | ICD-10-CM | POA: Diagnosis not present

## 2021-04-18 DIAGNOSIS — G8929 Other chronic pain: Secondary | ICD-10-CM | POA: Diagnosis not present

## 2021-04-18 DIAGNOSIS — G894 Chronic pain syndrome: Secondary | ICD-10-CM | POA: Diagnosis not present

## 2021-04-18 DIAGNOSIS — M25562 Pain in left knee: Secondary | ICD-10-CM | POA: Diagnosis not present

## 2021-04-18 DIAGNOSIS — M47817 Spondylosis without myelopathy or radiculopathy, lumbosacral region: Secondary | ICD-10-CM | POA: Diagnosis not present

## 2021-04-18 DIAGNOSIS — M25561 Pain in right knee: Secondary | ICD-10-CM | POA: Diagnosis not present

## 2021-04-27 DIAGNOSIS — M1711 Unilateral primary osteoarthritis, right knee: Secondary | ICD-10-CM | POA: Diagnosis not present

## 2021-04-30 ENCOUNTER — Other Ambulatory Visit: Payer: Self-pay | Admitting: Cardiovascular Disease

## 2021-05-12 ENCOUNTER — Telehealth: Payer: Self-pay

## 2021-05-12 DIAGNOSIS — I4891 Unspecified atrial fibrillation: Secondary | ICD-10-CM | POA: Diagnosis not present

## 2021-05-12 NOTE — Telephone Encounter (Signed)
Reminded patient to have INR drawn.  Verbalized understanding ?

## 2021-05-13 LAB — PROTIME-INR
INR: 2.5 — ABNORMAL HIGH (ref 0.9–1.2)
Prothrombin Time: 25 s — ABNORMAL HIGH (ref 9.1–12.0)

## 2021-05-15 ENCOUNTER — Ambulatory Visit (INDEPENDENT_AMBULATORY_CARE_PROVIDER_SITE_OTHER): Payer: Medicare HMO | Admitting: Cardiology

## 2021-05-15 DIAGNOSIS — Z7901 Long term (current) use of anticoagulants: Secondary | ICD-10-CM

## 2021-05-15 DIAGNOSIS — Z8673 Personal history of transient ischemic attack (TIA), and cerebral infarction without residual deficits: Secondary | ICD-10-CM

## 2021-05-18 DIAGNOSIS — M25532 Pain in left wrist: Secondary | ICD-10-CM | POA: Diagnosis not present

## 2021-05-18 DIAGNOSIS — Z79899 Other long term (current) drug therapy: Secondary | ICD-10-CM | POA: Diagnosis not present

## 2021-05-18 DIAGNOSIS — M545 Low back pain, unspecified: Secondary | ICD-10-CM | POA: Diagnosis not present

## 2021-05-18 DIAGNOSIS — M47817 Spondylosis without myelopathy or radiculopathy, lumbosacral region: Secondary | ICD-10-CM | POA: Diagnosis not present

## 2021-05-18 DIAGNOSIS — G8929 Other chronic pain: Secondary | ICD-10-CM | POA: Diagnosis not present

## 2021-05-18 DIAGNOSIS — G894 Chronic pain syndrome: Secondary | ICD-10-CM | POA: Diagnosis not present

## 2021-05-18 DIAGNOSIS — M25562 Pain in left knee: Secondary | ICD-10-CM | POA: Diagnosis not present

## 2021-05-18 DIAGNOSIS — M5136 Other intervertebral disc degeneration, lumbar region: Secondary | ICD-10-CM | POA: Diagnosis not present

## 2021-05-18 DIAGNOSIS — M25561 Pain in right knee: Secondary | ICD-10-CM | POA: Diagnosis not present

## 2021-05-23 ENCOUNTER — Other Ambulatory Visit: Payer: Self-pay | Admitting: Orthopedic Surgery

## 2021-05-30 ENCOUNTER — Encounter: Payer: Self-pay | Admitting: Family Medicine

## 2021-05-30 ENCOUNTER — Ambulatory Visit (INDEPENDENT_AMBULATORY_CARE_PROVIDER_SITE_OTHER): Payer: Medicare HMO | Admitting: Family Medicine

## 2021-05-30 VITALS — BP 122/80 | HR 85 | Wt 180.0 lb

## 2021-05-30 DIAGNOSIS — I4811 Longstanding persistent atrial fibrillation: Secondary | ICD-10-CM | POA: Diagnosis not present

## 2021-05-30 DIAGNOSIS — M1990 Unspecified osteoarthritis, unspecified site: Secondary | ICD-10-CM | POA: Diagnosis not present

## 2021-05-30 DIAGNOSIS — E782 Mixed hyperlipidemia: Secondary | ICD-10-CM | POA: Diagnosis not present

## 2021-05-30 DIAGNOSIS — Z01818 Encounter for other preprocedural examination: Secondary | ICD-10-CM

## 2021-05-30 DIAGNOSIS — Z8673 Personal history of transient ischemic attack (TIA), and cerebral infarction without residual deficits: Secondary | ICD-10-CM

## 2021-05-30 DIAGNOSIS — Z7901 Long term (current) use of anticoagulants: Secondary | ICD-10-CM | POA: Diagnosis not present

## 2021-05-30 DIAGNOSIS — M545 Low back pain, unspecified: Secondary | ICD-10-CM

## 2021-05-30 DIAGNOSIS — R4701 Aphasia: Secondary | ICD-10-CM

## 2021-05-30 DIAGNOSIS — G8929 Other chronic pain: Secondary | ICD-10-CM | POA: Diagnosis not present

## 2021-05-30 DIAGNOSIS — E039 Hypothyroidism, unspecified: Secondary | ICD-10-CM | POA: Diagnosis not present

## 2021-05-30 NOTE — Progress Notes (Signed)
? ?  Subjective:  ? ? Patient ID: Eric Ross, male    DOB: [birth year], 66 y.o.   MRN: [id number] ? ?HPI ?He is here for preoperative evaluation prior to total knee replacement on the right.  He apparently is also going to have the left one done at some point in the future.  He follows up regularly with cardiology and also gets his Coumadin levels checked regularly.  He has been consistent and had good numbers.  He does have a previous history of CVA with expressive aphasia.  This is stable.  He also has chronic low back pain and is on hydrocodone through pain clinic and seems to be holding his own there.  He does have a history of hypothyroid and is on appropriate medication.  He does not smoke .he has no other concerns or complaints. ? ? ?Review of Systems ? ?   ?Objective:  ? Physical Exam ?Alert and in no distress. Tympanic membranes difficult to see due to cerumen.  Pharyngeal area is normal. Neck is supple without adenopathy or thyromegaly. Cardiac exam shows an irregular rhythm without murmurs or gallops. Lungs are clear to auscultation.  Right knee does have some slight effusion present. ? ? ? ? ?   ?Assessment & Plan:  ?Preoperative evaluation to rule out surgical contraindication - Plan: CBC with Differential/Platelet, Comprehensive metabolic panel ? ?Chronic low back pain without sciatica, unspecified back pain laterality ? ?Expressive aphasia ? ?History of CVA (cerebrovascular accident) ? ?Long term current use of anticoagulant therapy ? ?Longstanding persistent atrial fibrillation ([place]) ? ?Arthritis ? ?Hypothyroidism, unspecified type ? ?Mixed hyperlipidemia - Plan: Lipid panel ?Routine blood screening will be done and he will then be cleared for surgery. ?I reinforced the fact that he needs to make sure he does the rehab postoperatively.  Also discussed the need for bridging since he will be on Coumadin.  They will need to follow the standard protocol and will also discuss this further with Dr.  Gwenlyn Found ?

## 2021-05-31 LAB — COMPREHENSIVE METABOLIC PANEL
ALT: 13 IU/L (ref 0–44)
AST: 20 IU/L (ref 0–40)
Albumin/Globulin Ratio: 2 (ref 1.2–2.2)
Albumin: 4.9 g/dL — ABNORMAL HIGH (ref 3.8–4.8)
Alkaline Phosphatase: 42 IU/L — ABNORMAL LOW (ref 44–121)
BUN/Creatinine Ratio: 18 (ref 10–24)
BUN: 16 mg/dL (ref 8–27)
Bilirubin Total: 0.5 mg/dL (ref 0.0–1.2)
CO2: 21 mmol/L (ref 20–29)
Calcium: 9.5 mg/dL (ref 8.6–10.2)
Chloride: 103 mmol/L (ref 96–106)
Creatinine, Ser: 0.89 mg/dL (ref 0.76–1.27)
Globulin, Total: 2.4 g/dL (ref 1.5–4.5)
Glucose: 97 mg/dL (ref 70–99)
Potassium: 4.6 mmol/L (ref 3.5–5.2)
Sodium: 140 mmol/L (ref 134–144)
Total Protein: 7.3 g/dL (ref 6.0–8.5)
eGFR: 95 mL/min/{1.73_m2} (ref >59)

## 2021-05-31 LAB — CBC WITH DIFFERENTIAL/PLATELET
Basophils Absolute: 0.1 10*3/uL (ref 0.0–0.2)
Basos: 1 %
EOS (ABSOLUTE): 0.2 10*3/uL (ref 0.0–0.4)
Eos: 3 %
Hematocrit: 39.6 % (ref 37.5–51.0)
Hemoglobin: 13.6 g/dL (ref 13.0–17.7)
Immature Grans (Abs): 0 10*3/uL (ref 0.0–0.1)
Immature Granulocytes: 0 %
Lymphocytes Absolute: 1.7 10*3/uL (ref 0.7–3.1)
Lymphs: 32 %
MCH: 29.4 pg (ref 26.6–33.0)
MCHC: 34.3 g/dL (ref 31.5–35.7)
MCV: 86 fL (ref 79–97)
Monocytes Absolute: 0.4 10*3/uL (ref 0.1–0.9)
Monocytes: 7 %
Neutrophils Absolute: 3.1 10*3/uL (ref 1.4–7.0)
Neutrophils: 57 %
Platelets: 173 10*3/uL (ref 150–450)
RBC: 4.62 x10E6/uL (ref 4.14–5.80)
RDW: 13.6 % (ref 11.6–15.4)
WBC: 5.5 10*3/uL (ref 3.4–10.8)

## 2021-05-31 LAB — LIPID PANEL
Chol/HDL Ratio: 3.4 ratio (ref 0.0–5.0)
Cholesterol, Total: 158 mg/dL (ref 100–199)
HDL: 47 mg/dL (ref >39)
LDL Chol Calc (NIH): 95 mg/dL (ref 0–99)
Triglycerides: 85 mg/dL (ref 0–149)
VLDL Cholesterol Cal: 16 mg/dL (ref 5–40)

## 2021-06-06 ENCOUNTER — Telehealth: Payer: Self-pay | Admitting: Pharmacist

## 2021-06-06 ENCOUNTER — Telehealth: Payer: Self-pay

## 2021-06-06 DIAGNOSIS — I4821 Permanent atrial fibrillation: Secondary | ICD-10-CM

## 2021-06-06 MED ORDER — ENOXAPARIN SODIUM 80 MG/0.8ML IJ SOSY: 80.0000 mg | PREFILLED_SYRINGE | Freq: Two times a day (BID) | INTRAMUSCULAR | 0 refills | Status: DC

## 2021-06-06 NOTE — Telephone Encounter (Signed)
?  Patient Consent for Virtual Visit  ? ? ?   ? ?Eric Ross has provided verbal consent on 06/06/2021 for a virtual visit (video or telephone). ? ? ?CONSENT FOR VIRTUAL VISIT FOR:  Eric Ross  ?By participating in this virtual visit I agree to the following: ? ?I hereby voluntarily request, consent and authorize [facility] and its employed or contracted physicians, physician assistants, nurse practitioners or other licensed health care professionals (the Practitioner), to provide me with telemedicine health care services (the ?Services") as deemed necessary by the treating Practitioner. I acknowledge and consent to receive the Services by the Practitioner via telemedicine. I understand that the telemedicine visit will involve communicating with the Practitioner through live audiovisual communication technology and the disclosure of certain medical information by electronic transmission. I acknowledge that I have been given the opportunity to request an in-person assessment or other available alternative prior to the telemedicine visit and am voluntarily participating in the telemedicine visit. ? ?I understand that I have the right to withhold or withdraw my consent to the use of telemedicine in the course of my care at any time, without affecting my right to future care or treatment, and that the Practitioner or I may terminate the telemedicine visit at any time. I understand that I have the right to inspect all information obtained and/or recorded in the course of the telemedicine visit and may receive copies of available information for a reasonable fee.  I understand that some of the potential risks of receiving the Services via telemedicine include:  ?Delay or interruption in medical evaluation due to technological equipment failure or disruption; ?Information transmitted may not be sufficient (e.g. poor resolution of images) to allow for appropriate medical decision making by the Practitioner;  and/or  ?In rare instances, security protocols could fail, causing a breach of personal health information. ? ?Furthermore, I acknowledge that it is my responsibility to provide information about my medical history, conditions and care that is complete and accurate to the best of my ability. I acknowledge that Practitioner's advice, recommendations, and/or decision may be based on factors not within their control, such as incomplete or inaccurate data provided by me or distortions of diagnostic images or specimens that may result from electronic transmissions. I understand that the practice of medicine is not an exact science and that Practitioner makes no warranties or guarantees regarding treatment outcomes. I acknowledge that a copy of this consent can be made available to me via my patient portal ([facility]), or I can request a printed copy by calling the office of [facility].   ? ?I understand that my insurance will be billed for this visit.  ? ?I have read or had this consent read to me. ?I understand the contents of this consent, which adequately explains the benefits and risks of the Services being provided via telemedicine.  ?I have been provided ample opportunity to ask questions regarding this consent and the Services and have had my questions answered to my satisfaction. ?I give my informed consent for the services to be provided through the use of telemedicine in my medical care ? ? ? ?

## 2021-06-06 NOTE — Telephone Encounter (Signed)
As mentioned in my previous note, pharmacy poor to review Coumadin.  We will need to schedule a telephone virtual visit with preop APP for final clearance. ?

## 2021-06-06 NOTE — Telephone Encounter (Signed)
Found clearance form in Media tab ?

## 2021-06-06 NOTE — Telephone Encounter (Signed)
Additional fax came in from [place] dated 06/06/21 ?

## 2021-06-06 NOTE — Patient Instructions (Addendum)
DUE TO COVID-19 ONLY TWO VISITORS  (aged 67 and older)  IS ALLOWED TO COME WITH YOU AND STAY IN THE WAITING ROOM ONLY DURING PRE OP AND PROCEDURE.   ?**NO VISITORS ARE ALLOWED IN THE SHORT STAY AREA OR RECOVERY ROOM!!** ? ?IF YOU WILL BE ADMITTED INTO THE HOSPITAL YOU ARE ALLOWED ONLY FOUR SUPPORT PEOPLE DURING VISITATION HOURS ONLY (7 AM -8PM)   ? The support person(s) must pass our screening, gel in and out ? Visitors GUEST BADGE MUST BE WORN VISIBLY  ? One adult visitor may remain with you overnight and MUST be in the room by 8 P.M.  ? ?You are not required to quarantine ?Hand Hygiene often ?Do NOT share personal items ?Notify your provider if you are in close contact with someone who has COVID or you develop fever 100.4 or greater, new onset of sneezing, cough, sore throat, shortness of breath or body aches. ? ?     ? Your procedure is scheduled on:  06-19-21 ? ? Report to [facility] Main Entrance ? ?  Report to admitting at 5:15 AM ? ? Call this number if you have problems the morning of surgery [phone] ? ? Do not eat food :After Midnight. ? ? After Midnight you may have the following liquids until 4:15 AM DAY OF SURGERY ? ?Water ?Black Coffee (sugar ok, NO MILK/CREAM OR CREAMERS)  ?Tea (sugar ok, NO MILK/CREAM OR CREAMERS) regular and decaf                             ?Plain Jell-O (NO RED)                                           ?Fruit ices (not with fruit pulp, NO RED)                                     ?Popsicles (NO RED)                                                                  ?Juice: apple, WHITE grape, WHITE cranberry ?Sports drinks like Gatorade (NO RED) ?Clear broth(vegetable,chicken,beef) ? ?             ?  ?  ?The day of surgery:  ?Drink ONE (1) Pre-Surgery Clear Ensure at 4:15 AM the morning of surgery. Drink in one sitting. Do not sip.  ?This drink was given to you during your hospital  ?pre-op appointment visit. ?Nothing else to drink after completing the Pre-Surgery Clear  Ensure   ?       If you have questions, please contact your surgeon?s office. ? ? ?FOLLOW  ANY ADDITIONAL PRE OP INSTRUCTIONS YOU RECEIVED FROM YOUR SURGEON'S OFFICE!!! ?  ?  ?Oral Hygiene is also important to reduce your risk of infection.                                    ?  Remember - BRUSH YOUR TEETH THE MORNING OF SURGERY WITH YOUR REGULAR TOOTHPASTE ? ? Do NOT smoke after Midnight ? ? Take these medicines the morning of surgery with A SIP OF WATER:  Carvedilol, Cetirizine, Levothyroxine, Pantoprazole. Tylenol if needed ?                  ?           You may not have any metal on your body including  jewelry, and body piercing ? ?           Do not wear  lotions, powders, cologne, or deodorant ? ?            Men may shave face and neck. ? ? Do not bring valuables to the hospital. [place]. ? ? Contacts, dentures or bridgework may not be worn into surgery. ? ? Bring an overnight bag ?  ?Patients discharged on the day of surgery will not be allowed to drive home.  Someone NEEDS to stay with you for the first 24 hours after anesthesia. ? ?Please read over the following fact sheets you were given: IF [place]  ? ?[place] - Preparing for Surgery ?Before surgery, you can play an important role.  Because skin is not sterile, your skin needs to be as free of germs as possible.  You can reduce the number of germs on your skin by washing with CHG (chlorahexidine gluconate) soap before surgery.  CHG is an antiseptic cleaner which kills germs and bonds with the skin to continue killing germs even after washing. ?Please DO NOT use if you have an allergy to CHG or antibacterial soaps.  If your skin becomes reddened/irritated stop using the CHG and inform your nurse when you arrive at Short Stay. ?Do not shave (including legs and underarms) for at least 48 hours prior to the first CHG shower.  You may shave your  face/neck. ? ?Please follow these instructions carefully: ? 1.  Shower with CHG Soap the night before surgery and the  morning of surgery. ? 2.  If you choose to wash your hair, wash your hair first as usual with your normal  shampoo. ? 3.  After you shampoo, rinse your hair and body thoroughly to remove the shampoo.                            ? 4.  Use CHG as you would any other liquid soap.  You can apply chg directly to the skin and wash.  Gently with a scrungie or clean washcloth. ? 5.  Apply the CHG Soap to your body ONLY FROM THE NECK DOWN.   Do   not use on face/ open      ?                     Wound or open sores. Avoid contact with eyes, ears mouth and   genitals (private parts).  ?                     Production manager,  Genitals (private parts) with your normal soap. ?            6.  Wash thoroughly, paying special attention to the area where your    surgery  will be performed. ? 7.  Thoroughly rinse your body with warm water from the neck down. ? 8.  DO NOT shower/wash with your normal soap after using and rinsing off the CHG Soap. ?               9.  Pat yourself dry with a clean towel. ?           10.  Wear clean pajamas. ?           11.  Place clean sheets on your bed the night of your first shower and do not  sleep with pets. ?Day of Surgery : ?Do not apply any lotions/deodorants the morning of surgery.  Please wear clean clothes to the hospital/surgery center. ? ?FAILURE TO FOLLOW THESE INSTRUCTIONS MAY RESULT IN THE CANCELLATION OF YOUR SURGERY ? ?PATIENT SIGNATURE_________________________________ ? ?NURSE SIGNATURE__________________________________ ? ?________________________________________________________________________  ?  ? ?Incentive Spirometer ? ?An incentive spirometer is a tool that can help keep your lungs clear and active. This tool measures how well you are filling your lungs with each breath. Taking long deep breaths may help reverse or decrease the chance of developing breathing (pulmonary)  problems (especially infection) following: ?A long period of time when you are unable to move or be active. ?BEFORE THE PROCEDURE  ?If the spirometer includes an indicator to show your best effort, your nurse or respiratory therapist will set it to a desired goal. ?If possible, sit up straight or lean slightly forward. Try not to slouch. ?Hold the incentive spirometer in an upright position. ?INSTRUCTIONS FOR USE  ?Sit on the edge of your bed if possible, or sit up as far as you can in bed or on a chair. ?Hold the incentive spirometer in an upright position. ?Breathe out normally. ?Place the mouthpiece in your mouth and seal your lips tightly around it. ?Breathe in slowly and as deeply as possible, raising the piston or the ball toward the top of the column. ?Hold your breath for 3-5 seconds or for as long as possible. Allow the piston or ball to fall to the bottom of the column. ?Remove the mouthpiece from your mouth and breathe out normally. ?Rest for a few seconds and repeat Steps 1 through 7 at least 10 times every 1-2 hours when you are awake. Take your time and take a few normal breaths between deep breaths. ?The spirometer may include an indicator to show your best effort. Use the indicator as a goal to work toward during each repetition. ?After each set of 10 deep breaths, practice coughing to be sure your lungs are clear. If you have an incision (the cut made at the time of surgery), support your incision when coughing by placing a pillow or rolled up towels firmly against it. ?Once you are able to get out of bed, walk around indoors and cough well. You may stop using the incentive spirometer when instructed by your caregiver.  ?RISKS AND COMPLICATIONS ?Take your time so you do not get dizzy or light-headed. ?If you are in pain, you may need to take or ask for pain medication before doing incentive spirometry. It is harder to take a deep breath if you are having pain. ?AFTER USE ?Rest and breathe slowly and  easily. ?It can be helpful to keep track of a log of your progress. Your caregiver can provide you with a simple table to help with this. ?If you are using the spirometer at home, follow these instructi

## 2021-06-06 NOTE — Telephone Encounter (Signed)
Spoke with patient's wife (DPR on file) who is agreeable to having a virtual visit on 5/18 at 9:40 am. Medications have be reconciled and telephone consent has been giving.  ?

## 2021-06-06 NOTE — Telephone Encounter (Signed)
Patient with diagnosis of A Fib on Coumadin for anticoagulation.   ? ?Procedure: right knee arthroplasty ?Date of procedure: 06/19/21 ? ? ?CHA2DS2-VASc Score = 4  ?This indicates a 4.8% annual risk of stroke. ?The patient's score is based upon: ?CHF History: 0 ?HTN History: 1 ?Diabetes History: 0 ?Stroke History: 2 ?Vascular Disease History: 0 ?Age Score: 1 ?Gender Score: 0 ? ? ?CrCl 94 mL/min ?Platelet count 173K ? ?Per office protocol, patient can hold warfarin for 5 days prior to procedure.   ? ?Patient WILL need bridging with Lovenox (enoxaparin) around procedure.  Will route to [facility] clinic. Enoxaparin sent to pharmacy ? ?

## 2021-06-06 NOTE — Progress Notes (Addendum)
COVID Vaccine Completed:  No ?Date COVID Vaccine completed: ?Has received booster: ?COVID vaccine manufacturer: [place]  ? ?Date of COVID positive in last 90 days:  No ? ?PCP - Jill Alexanders, MD ?Cardiologist - Quay Burow, MD ?Electrophysiologist - Allegra Lai, MD ? ?Chest x-ray - 06-07-21 Epic ?EKG - 08-04-20 Epic ?Stress Test - 08-17-20 Epic ?ECHO - greater than 2 years Epic ?Cardiac Cath - greater than 2 years ?Pacemaker/ICD device last checked: ?Spinal Cord Stimulator: ?Telemetry Monitor - 2019 Epic ? ?Bowel Prep - N/A ? ?Sleep Study - N/A ?CPAP -  ? ?Fasting Blood Sugar - N/A ?Checks Blood Sugar _____ times a day ? ?Blood Thinner Instructions: Coumadin.  Per patient/wife last dose 06-13-21 and will start Lovenox bridge ?Aspirin Instructions: ?Last Dose: ? ?Activity level:   Can go up a flight of stairs and perform activities of daily living without stopping and without symptoms of chest pain.  Patient's wife states that  patient has shortness of breath with exertion at times.   ?   ?Anesthesia review:  Afib, cardiomyopathy, HTN.  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  No difficulty swallowing. ? ?Patient denies shortness of breath, fever, cough and chest pain at PAT appointment ? ?Patient verbalized understanding of instructions that were given to them at the PAT appointment. Patient was also instructed that they will need to review over the PAT instructions again at home before surgery.  ?

## 2021-06-06 NOTE — Telephone Encounter (Signed)
? ?  Pre-operative Risk Assessment  ?  ?Patient Name: Eric Ross  ?DOB: 1955/10/24 ?MRN: [id number]  ? ?  ? ?Request for Surgical Clearance   ? ?Procedure:   right knee arthoplasty ? ?Date of Surgery:  Clearance 06/19/21                              ?   ?Surgeon:  Dr. Frederik Pear ?Surgeon's Group or Practice Name:  [facility] ?Phone number:  [phone] ?Fax number:  [phone] ?  ?Type of Clearance Requested:   ?- Medical  ?- Pharmacy:  Hold Warfarin (Coumadin)   ?  ?Type of Anesthesia:  Spinal ?  ?Additional requests/questions:  return with medical notes ? ?Signed, ?Sheral Apley M   ?06/06/2021, 4:44 PM  ? ?

## 2021-06-06 NOTE — Telephone Encounter (Signed)
Patient;s wife called. Reported husband is having a knee replacement performed by Dr Frederik Pear at [facility] and had not heard about holding his warfarin yet. Advised we do not have a clearance yet. Advised I would route to pre op team. ?

## 2021-06-06 NOTE — Telephone Encounter (Signed)
[name], the surgery scheduler for [name] and left message for her to fax over a clearance for patient knee replacement and how long to hold warfarin.  ?

## 2021-06-06 NOTE — Telephone Encounter (Signed)
Callback pool, please collect more information regarding the surgery. ? ?After information is collected, please forward cardiac clearance request to [facility] for our clinical pharmacist to review his Coumadin. ? ?Please also schedule a telephone virtual visit with preop APP as well  ?

## 2021-06-07 ENCOUNTER — Encounter (HOSPITAL_COMMUNITY)
Admission: RE | Admit: 2021-06-07 | Discharge: 2021-06-07 | Disposition: A | Discharge status: Home / Self Care | Payer: Medicare HMO | Source: Ambulatory Visit | Attending: Orthopedic Surgery | Admitting: Orthopedic Surgery

## 2021-06-07 ENCOUNTER — Encounter (HOSPITAL_COMMUNITY): Payer: Self-pay

## 2021-06-07 ENCOUNTER — Ambulatory Visit (HOSPITAL_COMMUNITY)
Admission: RE | Admit: 2021-06-07 | Discharge: 2021-06-07 | Disposition: A | Discharge status: Home / Self Care | Payer: Medicare HMO | Source: Ambulatory Visit | Attending: Orthopedic Surgery | Admitting: Orthopedic Surgery

## 2021-06-07 ENCOUNTER — Other Ambulatory Visit: Payer: Self-pay

## 2021-06-07 VITALS — BP 117/89 | HR 81 | Temp 97.9°F | Resp 18 | Ht 72.0 in | Wt 176.0 lb

## 2021-06-07 DIAGNOSIS — Z7901 Long term (current) use of anticoagulants: Secondary | ICD-10-CM

## 2021-06-07 DIAGNOSIS — Z01818 Encounter for other preprocedural examination: Secondary | ICD-10-CM

## 2021-06-07 HISTORY — DX: Malignant neoplasm of thyroid gland: C73

## 2021-06-07 HISTORY — DX: Dyspnea, unspecified: R06.00

## 2021-06-07 HISTORY — DX: Personal history of urinary calculi: Z87.442

## 2021-06-07 HISTORY — DX: Gastro-esophageal reflux disease without esophagitis: K21.9

## 2021-06-07 HISTORY — DX: Headache, unspecified: R51.9

## 2021-06-07 LAB — TYPE AND SCREEN
ABO/RH(D): B POS
Antibody Screen: NEGATIVE

## 2021-06-07 LAB — SURGICAL PCR SCREEN
MRSA, PCR: NEGATIVE
Staphylococcus aureus: NEGATIVE

## 2021-06-07 NOTE — Telephone Encounter (Signed)
I s/w pt's wife (DPR). Advised pt's wife that [facility] will call them to set up Lovenox Bridge. Pt's wife said that Legrand Como, RN called this morning though they were on their way out.Stated that Legrand Como, RN said he will call back tomorrow to set up Lovenox Bridge. Pt's wife thanked me for the call and the help. Pt has a tele pre op appt 06/15/21 @ 9:40. I will send FYI to pharm-d as well.  ?

## 2021-06-07 NOTE — Telephone Encounter (Signed)
Please inform the patient that he will need a Lovenox bridging prior to surgery.  This will be arranged by our Coumadin clinic at [facility].  Lovenox bridging has been sent to the pharmacy ?

## 2021-06-08 ENCOUNTER — Telehealth: Payer: Self-pay

## 2021-06-08 DIAGNOSIS — M1711 Unilateral primary osteoarthritis, right knee: Secondary | ICD-10-CM | POA: Diagnosis not present

## 2021-06-08 NOTE — Telephone Encounter (Signed)
5/16: Last dose of warfarin. ? ?5/17: No warfarin or enoxaparin (Lovenox). ? ?5/18: Inject enoxaparin '80mg'$  in the fatty abdominal tissue at least 2 inches from the belly button twice a day about 12 hours apart, 8am and 8pm rotate sites. No warfarin. ? ?5/19: Inject enoxaparin in the fatty tissue every 12 hours, 8am and 8pm. No warfarin. ? ?5/20: Inject enoxaparin in the fatty tissue every 12 hours, 8am and 8pm. No warfarin. ? ?5/21: Inject enoxaparin in the fatty tissue in the morning at 8 am (No PM dose). No warfarin. ? ?5/22: Procedure Day - No enoxaparin - Resume warfarin in the evening or as directed by doctor (take an extra half tablet with usual dose for 2 days then resume normal dose). ? ?5/23: Resume enoxaparin inject in the fatty tissue every 12 hours and take warfarin ? ?5/24: Inject enoxaparin in the fatty tissue every 12 hours and take warfarin ? ?5/25: Inject enoxaparin in the fatty tissue every 12 hours and take warfarin ? ?5/26: Inject enoxaparin in the fatty tissue every 12 hours and take warfarin ? ?5/27-29: Inject enoxaparin in the fatty tissue every 12 hours and take warfarin ? ?5/30: warfarin appt to check INR. ? ?

## 2021-06-08 NOTE — Telephone Encounter (Signed)
I called and spoke to patient's wife and discussed upcoming Lovenox Bridge.  His procedure is set 5/22.  Verbalized understanding. ?

## 2021-06-09 NOTE — Progress Notes (Addendum)
Anesthesia Chart Review   Case: [id number] Date/Time: 06/19/21 0700   Procedure: RIGHT TOTAL KNEE ARTHROPLASTY (Right: Knee)   Anesthesia type: Spinal   Pre-op diagnosis: RIGHT KNEE OSTEOARTHRITIS   Location: [facility] ROOM 07 / [facility]   Surgeons: Frederik Pear, MD       DISCUSSION:66 y.o. never smoker with h/o GERD, atrial fibrillation (on Coumadin), nonischemic cardiomyopathy, right knee OA scheduled for above procedure 06/19/2021 with Dr. Frederik Pear.   Pt has appointment with cardiology on 06/15/2021 for preoperative evaluation.   Addendum 06/16/2021:  Pt seen by cardiology yesterday 06/15/2021. Per OV note, "Preoperative Cardiovascular Risk Assessment: RCRI 6.6% indicating moderate CV risk based on h/o cardiomyopathy, stroke. The patient affirms he has been doing well without any new cardiac symptoms. They are able to achieve over 4 METS METS without cardiac limitations. Therefore, based on ACC/AHA guidelines, the patient would be at acceptable risk for the planned procedure without further cardiovascular testing. The patient was advised that if he develops new symptoms prior to surgery to contact our office to arrange for a follow-up visit, and he verbalized understanding.  Regarding warfarin, patient does require Lovenox bridging due to prior h/o CVA per pharmacy team and this has already been arranged by our Coumadin clinic with their recommendations outlined in 06/08/21 phone note"  Anticipate pt can proceed with planned procedure barring acute status change.   VS: BP 117/89   Pulse 81   Temp 36.6 C (Oral)   Resp 18   Ht 6' (1.829 m)   Wt 79.8 kg   SpO2 98%   BMI 23.87 kg/m   PROVIDERS: Denita Lung, MD is PCP   Cardiologist - Quay Burow, MD  Electrophysiologist - Allegra Lai, MD LABS: Labs reviewed: Acceptable for surgery. (all labs ordered are listed, but only abnormal results are displayed)  Labs Reviewed  SURGICAL PCR SCREEN  TYPE AND SCREEN      IMAGES:   EKG: 08/04/2020 Rate 76 bpm  Afib rate controlled No changes   CV: Stress Test 08/17/2020 The left ventricular ejection fraction is moderately decreased (30-44%). Nuclear stress EF: 44%. Mildly hypokinetic diffuse contraction There was no ST segment deviation noted during stress. This is an intermediate risk study based upon reduced ejection fraction. There are no perfusion defects suggestive of ischemia or infarction. Prior echocardiogram in 2019 showed EF of 45%.  Echo Study Conclusions 01/15/2018  - Left ventricle: The cavity size was normal. Wall thickness was    normal. The estimated ejection fraction was 45%. Diffuse    hypokinesis. The study was not technically sufficient to allow    evaluation of LV diastolic dysfunction due to atrial    fibrillation.  - Aortic valve: There was no stenosis.  - Mitral valve: There was trivial regurgitation.  - Left atrium: The atrium was moderately dilated.  - Right ventricle: The cavity size was mildly dilated. Systolic    function was normal.  - Right atrium: The atrium was mildly dilated.  - Tricuspid valve: Peak RV-RA gradient (S): 23 mm Hg.  - Pulmonary arteries: PA peak pressure: 26 mm Hg (S).  - Inferior vena cava: The vessel was normal in size. The    respirophasic diameter changes were in the normal range (= 50%),    consistent with normal central venous pressure.  Past Medical History:  Diagnosis Date   Atrial fibrillation ([place])    Dr. Gwenlyn Found   Cardiomyopathy    nonischemic   Dyspnea    Erectile dysfunction  GERD (gastroesophageal reflux disease)    Headache    History of kidney stones    History of thyroid cancer    Hyperlipidemia    Hypothyroidism    Pseudogout    Stroke ([place]) 01/29/2001   Thyroid cancer [facility])     Past Surgical History:  Procedure Laterality Date   CARDIAC CATHETERIZATION  09/22/2001   NORMAL CORONARY ARTERIES   JOINT REPLACEMENT     BILATERAL KNEE    KNEE SURGERY  Bilateral    LEXISCAN MYOCARDIAL PERFUSION STUDY  05/30/2011   NORMAL MYOCARDIAL PERFUSION STUDY EF% 49%.   THYROIDECTOMY     TRANSTHORACIC ECHOCARDIOGRAM  05/30/2011   EF%=45-50%. NORMAL LV WALL THICKNESS. RV IS MILDLY DILATED. NO SIGNIFICANT VALVE DISEASE.     MEDICATIONS:  acetaminophen (TYLENOL) 500 MG tablet   Aspirin-Salicylamide-Caffeine (BC FAST PAIN RELIEF) 650-195-33.3 MG PACK   carvedilol (COREG) 25 MG tablet   cetirizine (ZYRTEC) 10 MG tablet   diclofenac Sodium (VOLTAREN) 1 % GEL   enoxaparin (LOVENOX) 80 MG/0.8ML injection   HYDROcodone-acetaminophen (NORCO) 10-325 MG tablet   levothyroxine (SYNTHROID) 112 MCG tablet   pantoprazole (PROTONIX) 40 MG tablet   ramipril (ALTACE) 2.5 MG capsule   simvastatin (ZOCOR) 20 MG tablet   tadalafil (CIALIS) 20 MG tablet   warfarin (COUMADIN) 5 MG tablet   No current facility-administered medications for this encounter.     Konrad Felix Ward, PA-C [facility]-Surgical Testing [phone]

## 2021-06-15 ENCOUNTER — Encounter: Payer: Self-pay | Admitting: Physician Assistant

## 2021-06-15 ENCOUNTER — Ambulatory Visit (INDEPENDENT_AMBULATORY_CARE_PROVIDER_SITE_OTHER): Payer: Medicare HMO | Admitting: Physician Assistant

## 2021-06-15 DIAGNOSIS — M5136 Other intervertebral disc degeneration, lumbar region: Secondary | ICD-10-CM | POA: Diagnosis not present

## 2021-06-15 DIAGNOSIS — M47817 Spondylosis without myelopathy or radiculopathy, lumbosacral region: Secondary | ICD-10-CM | POA: Diagnosis not present

## 2021-06-15 DIAGNOSIS — M545 Low back pain, unspecified: Secondary | ICD-10-CM | POA: Diagnosis not present

## 2021-06-15 DIAGNOSIS — M1711 Unilateral primary osteoarthritis, right knee: Secondary | ICD-10-CM | POA: Diagnosis present

## 2021-06-15 DIAGNOSIS — Z01810 Encounter for preprocedural cardiovascular examination: Secondary | ICD-10-CM

## 2021-06-15 DIAGNOSIS — M25532 Pain in left wrist: Secondary | ICD-10-CM | POA: Diagnosis not present

## 2021-06-15 DIAGNOSIS — M25562 Pain in left knee: Secondary | ICD-10-CM | POA: Diagnosis not present

## 2021-06-15 DIAGNOSIS — Z79891 Long term (current) use of opiate analgesic: Secondary | ICD-10-CM | POA: Diagnosis not present

## 2021-06-15 DIAGNOSIS — M25561 Pain in right knee: Secondary | ICD-10-CM | POA: Diagnosis not present

## 2021-06-15 DIAGNOSIS — G894 Chronic pain syndrome: Secondary | ICD-10-CM | POA: Diagnosis not present

## 2021-06-15 DIAGNOSIS — G8929 Other chronic pain: Secondary | ICD-10-CM | POA: Diagnosis not present

## 2021-06-15 NOTE — Progress Notes (Signed)
Virtual Visit via Telephone Note   This visit type was conducted due to national recommendations for restrictions regarding the COVID-19 Pandemic (e.g. social distancing) in an effort to limit this patient's exposure and mitigate transmission in our community.  Due to his co-morbid illnesses, this patient is at least at moderate risk for complications without adequate follow up.  This format is felt to be most appropriate for this patient at this time.  The patient did not have access to video technology/had technical difficulties with video requiring transitioning to audio format only (telephone).  All issues noted in this document were discussed and addressed.  No physical exam could be performed with this format.  Please refer to the patient's chart for his  consent to telehealth for [facility].  Evaluation Performed:  Preoperative cardiovascular risk assessment _____________   Date:  06/15/2021   Patient ID:  Eric Ross, DOB 03/05/1955, MRN [id number] Patient Location:  Home Provider location:   Office  Primary Care Provider:  Denita Lung, MD Primary Cardiologist:  Quay Burow, MD  Chief Complaint    66 y.o. y/o male with a h/o NICM (cath 2003 with normal coronaries), chronic atrial fib, HTN, HLD, prior stroke who is pending right total knee replacement, and presents today for telephonic preoperative cardiovascular risk assessment.  Past Medical History    Past Medical History:  Diagnosis Date   Atrial fibrillation ([place])    Dr. Gwenlyn Found   Cardiomyopathy    nonischemic   Dyspnea    Erectile dysfunction    GERD (gastroesophageal reflux disease)    Headache    History of kidney stones    History of thyroid cancer    Hyperlipidemia    Hypothyroidism    Pseudogout    Stroke ([place]) 01/29/2001   Thyroid cancer [facility])    Past Surgical History:  Procedure Laterality Date   CARDIAC CATHETERIZATION  09/22/2001   NORMAL CORONARY ARTERIES   JOINT REPLACEMENT      BILATERAL KNEE    KNEE SURGERY Bilateral    LEXISCAN MYOCARDIAL PERFUSION STUDY  05/30/2011   NORMAL MYOCARDIAL PERFUSION STUDY EF% 49%.   THYROIDECTOMY     TRANSTHORACIC ECHOCARDIOGRAM  05/30/2011   EF%=45-50%. NORMAL LV WALL THICKNESS. RV IS MILDLY DILATED. NO SIGNIFICANT VALVE DISEASE.     Allergies  Allergies  Allergen Reactions   Penicillins     REACTION: Paralyzed him @ 66 yrs old    History of Present Illness    Eric Ross is a 66 y.o. male who presents via audio/video conferencing for a telehealth visit today. History reviewed. In addition to what is outlined above, he has a history of recent nuclear stress test 07/2020 which showed no ischemia, EF 45% which was stable from prior EF of 45% by echo in 2019. Pt was last seen in cardiology clinic on 09/2020 by Dr. Gwenlyn Found.  At that time Eric Ross was doing well.  The patient is now pending procedure as outlined above. Since his last visit, he has not had any cardiac symptoms. His wife assists with the visit on speakerphone as his stroke previously affected his speech. He still remains active around the house including mowing outdoors, yardwork, housework without any cardiac symptoms.   Home Medications    Prior to Admission medications   Medication Sig Start Date End Date Taking? Authorizing Provider  acetaminophen (TYLENOL) 500 MG tablet Take 500-1,000 mg by mouth every 6 (six) hours as needed (headaches.).    [provider]  Aspirin-Salicylamide-Caffeine (BC FAST PAIN RELIEF) 650-195-33.3 MG PACK Take 1 packet by mouth daily as needed (pain.).    [provider]  carvedilol (COREG) 25 MG tablet Take 1.5 tablets (37.5 mg total) by mouth 2 (two) times daily. 10/31/20   Denita Lung, MD  cetirizine (ZYRTEC) 10 MG tablet Take 10 mg by mouth daily as needed for allergies.    [provider]  diclofenac Sodium (VOLTAREN) 1 % GEL Apply 4 g topically 2 (two) times daily as needed (pain.). 05/16/21    [provider]  enoxaparin (LOVENOX) 80 MG/0.8ML injection Inject 0.8 mLs (80 mg total) into the skin every 12 (twelve) hours. 06/06/21   Lorretta Harp, MD  HYDROcodone-acetaminophen (NORCO) 10-325 MG tablet Take 1 tablet by mouth every 6 (six) hours as needed (pain.). 05/18/21   [provider]  levothyroxine (SYNTHROID) 112 MCG tablet Take 1 tablet (112 mcg total) by mouth daily. 10/31/20   Denita Lung, MD  pantoprazole (PROTONIX) 40 MG tablet Take 1 tablet (40 mg total) by mouth daily. 10/31/20   Denita Lung, MD  ramipril (ALTACE) 2.5 MG capsule Take 1 capsule (2.5 mg total) by mouth daily. 10/31/20   Denita Lung, MD  simvastatin (ZOCOR) 20 MG tablet Take 1 tablet (20 mg total) by mouth daily. Patient taking differently: Take 20 mg by mouth every evening. 10/31/20   Denita Lung, MD  tadalafil (CIALIS) 20 MG tablet Take 1 tablet (20 mg total) by mouth daily as needed for erectile dysfunction. 10/31/20   Denita Lung, MD  warfarin (COUMADIN) 5 MG tablet TAKE 1/2 TO 1 TABLET BY MOUTH DAILY AS DIRECTED BY COUMADIN CLINIC Patient taking differently: Take 2.5-5 mg by mouth See admin instructions. Take 0.5 tablet (2.5 mg) by mouth on Mondays, Wednesdays & Fridays in the evening. Take 1 tablet (5 mg) by mouth on Sundays, Tuesdays, Thursdays & Saturdays in the evening. 05/01/21   Lorretta Harp, MD    Physical Exam    Vital Signs:  [place] does not have vital signs available for review today.  Given telephonic nature of communication, physical exam is limited. AAOx3. NAD. Normal affect.  Speech and respirations are unlabored.  Accessory Clinical Findings    None  Assessment & Plan    1.  Preoperative Cardiovascular Risk Assessment: RCRI 6.6% indicating moderate CV risk based on h/o cardiomyopathy, stroke. The patient affirms he has been doing well without any new cardiac symptoms. They are able to achieve over 4 METS METS without cardiac limitations.  Therefore, based on ACC/AHA guidelines, the patient would be at acceptable risk for the planned procedure without further cardiovascular testing. The patient was advised that if he develops new symptoms prior to surgery to contact our office to arrange for a follow-up visit, and he verbalized understanding.  Regarding warfarin, patient does require Lovenox bridging due to prior h/o CVA per pharmacy team and this has already been arranged by our Coumadin clinic with their recommendations outlined in 06/08/21 phone note:  5/16: Last dose of warfarin.   5/17: No warfarin or enoxaparin (Lovenox).   5/18: Inject enoxaparin '80mg'$  in the fatty abdominal tissue at least 2 inches from the belly button twice a day about 12 hours apart, 8am and 8pm rotate sites. No warfarin.   5/19: Inject enoxaparin in the fatty tissue every 12 hours, 8am and 8pm. No warfarin.   5/20: Inject enoxaparin in the fatty tissue every 12 hours, 8am and  8pm. No warfarin.   5/21: Inject enoxaparin in the fatty tissue in the morning at 8 am (No PM dose). No warfarin.   5/22: Procedure Day - No enoxaparin - Resume warfarin in the evening or as directed by doctor (take an extra half tablet with usual dose for 2 days then resume normal dose).   5/23: Resume enoxaparin inject in the fatty tissue every 12 hours and take warfarin   5/24: Inject enoxaparin in the fatty tissue every 12 hours and take warfarin   5/25: Inject enoxaparin in the fatty tissue every 12 hours and take warfarin   5/26: Inject enoxaparin in the fatty tissue every 12 hours and take warfarin   5/27-29: Inject enoxaparin in the fatty tissue every 12 hours and take warfarin   5/30: warfarin appt to check INR.     A copy of this note will be routed to requesting surgeon.  Time:   Today, I have spent 5 minutes with the patient with telehealth technology discussing medical history, symptoms, and management plan.     Charlie Pitter, PA-C  06/15/2021, 9:44  AM

## 2021-06-15 NOTE — H&P (Signed)
TOTAL KNEE ADMISSION H&P  Patient is being admitted for right total knee arthroplasty.  Subjective:  Chief Complaint:right knee pain.  HPI: Eric Ross, 66 y.o. male, has a history of pain and functional disability in the right knee due to trauma and arthritis and has failed non-surgical conservative treatments for greater than 12 weeks to includeNSAID's and/or analgesics, corticosteriod injections, viscosupplementation injections, flexibility and strengthening excercises, weight reduction as appropriate, and activity modification.  Onset of symptoms was gradual, starting  several  years ago with gradually worsening course since that time. The patient noted no past surgery on the right knee(s).  Patient currently rates pain in the right knee(s) at 10 out of 10 with activity. Patient has night pain, worsening of pain with activity and weight bearing, pain that interferes with activities of daily living, pain with passive range of motion, crepitus, and joint swelling.  Patient has evidence of subchondral sclerosis, periarticular osteophytes, and joint space narrowing by imaging studies.  There is no active infection.  Patient Active Problem List   Diagnosis Date Noted   Osteoarthritis of right knee 06/15/2021   Immunization refused 10/31/2020   Left wrist pain 02/16/2020   Complex regional pain syndrome type 1 of upper extremity 02/16/2020   Lumbosacral spondylosis 02/16/2020   Lumbar degenerative disc disease 02/16/2020   High risk medications (not anticoagulants) long-term use 02/16/2020   Long-term current use of opiate analgesic 02/16/2020   Chronic pain syndrome 02/16/2020   Chronic low back pain 10/30/2019   Nonischemic cardiomyopathy ([place]) 02/05/2018   Hemiparesis affecting right side as late effect of stroke ([place]) 02/15/2017   Expressive aphasia 02/15/2017   Erectile dysfunction 04/25/2016   Hypothyroidism 04/25/2016   Gastroesophageal reflux disease 04/25/2016   Mixed  hyperlipidemia 11/03/2014   Atrial fibrillation ([place]) 04/23/2012   Long term current use of anticoagulant therapy 04/23/2012   History of CVA (cerebrovascular accident) 04/23/2012   Allergic rhinitis 12/14/2010   Essential hypertension 07/05/2008   Past Medical History:  Diagnosis Date   Atrial fibrillation ([place])    Dr. Gwenlyn Found   Cardiomyopathy    nonischemic   Dyspnea    Erectile dysfunction    GERD (gastroesophageal reflux disease)    Headache    History of kidney stones    History of thyroid cancer    Hyperlipidemia    Hypothyroidism    Pseudogout    Stroke ([place]) 01/29/2001   Thyroid cancer ([place])     Past Surgical History:  Procedure Laterality Date   CARDIAC CATHETERIZATION  09/22/2001   NORMAL CORONARY ARTERIES   JOINT REPLACEMENT     BILATERAL KNEE    KNEE SURGERY Bilateral    LEXISCAN MYOCARDIAL PERFUSION STUDY  05/30/2011   NORMAL MYOCARDIAL PERFUSION STUDY EF% 49%.   THYROIDECTOMY     TRANSTHORACIC ECHOCARDIOGRAM  05/30/2011   EF%=45-50%. NORMAL LV WALL THICKNESS. RV IS MILDLY DILATED. NO SIGNIFICANT VALVE DISEASE.     No current facility-administered medications for this encounter.   Current Outpatient Medications  Medication Sig Dispense Refill Last Dose   acetaminophen (TYLENOL) 500 MG tablet Take 500-1,000 mg by mouth every 6 (six) hours as needed (headaches.).      Aspirin-Salicylamide-Caffeine (BC FAST PAIN RELIEF) 650-195-33.3 MG PACK Take 1 packet by mouth daily as needed (pain.).      carvedilol (COREG) 25 MG tablet Take 1.5 tablets (37.5 mg total) by mouth 2 (two) times daily. 270 tablet 3    cetirizine (ZYRTEC) 10 MG tablet Take 10 mg by mouth  daily as needed for allergies.      diclofenac Sodium (VOLTAREN) 1 % GEL Apply 4 g topically 2 (two) times daily as needed (pain.).      HYDROcodone-acetaminophen (NORCO) 10-325 MG tablet Take 1 tablet by mouth every 6 (six) hours as needed (pain.).      levothyroxine (SYNTHROID) 112 MCG tablet Take 1 tablet  (112 mcg total) by mouth daily. 90 tablet 3    pantoprazole (PROTONIX) 40 MG tablet Take 1 tablet (40 mg total) by mouth daily. 90 tablet 3    ramipril (ALTACE) 2.5 MG capsule Take 1 capsule (2.5 mg total) by mouth daily. 90 capsule 3    simvastatin (ZOCOR) 20 MG tablet Take 1 tablet (20 mg total) by mouth daily. (Patient taking differently: Take 20 mg by mouth every evening.) 90 tablet 3    tadalafil (CIALIS) 20 MG tablet Take 1 tablet (20 mg total) by mouth daily as needed for erectile dysfunction. 30 tablet 5    warfarin (COUMADIN) 5 MG tablet TAKE 1/2 TO 1 TABLET BY MOUTH DAILY AS DIRECTED BY COUMADIN CLINIC (Patient taking differently: Take 2.5-5 mg by mouth See admin instructions. Take 0.5 tablet (2.5 mg) by mouth on Mondays, Wednesdays & Fridays in the evening. Take 1 tablet (5 mg) by mouth on Sundays, Tuesdays, Thursdays & Saturdays in the evening.) 90 tablet 0    enoxaparin (LOVENOX) 80 MG/0.8ML injection Inject 0.8 mLs (80 mg total) into the skin every 12 (twelve) hours. 16 mL 0    Allergies  Allergen Reactions   Penicillins     REACTION: Paralyzed him @ 65 yrs old    Social History   Tobacco Use   Smoking status: Never   Smokeless tobacco: Current    Types: Chew  Substance Use Topics   Alcohol use: No    Family History  Problem Relation Age of Onset   Thyroid cancer Mother    Cancer Sister    Stroke Brother      Review of Systems  Constitutional:  Positive for fatigue.  HENT:  Positive for sinus pain.   Eyes: Negative.   Respiratory: Negative.    Cardiovascular:  Positive for chest pain and palpitations.  Gastrointestinal:  Positive for diarrhea.  Endocrine: Negative.   Genitourinary: Negative.   Musculoskeletal:  Positive for arthralgias and myalgias.  Skin: Negative.   Neurological:  Positive for dizziness and headaches.  Hematological: Negative.   Psychiatric/Behavioral: Negative.     Objective:  Physical Exam Constitutional:      Appearance: Normal  appearance. He is normal weight.  HENT:     Head: Normocephalic and atraumatic.     Nose: Nose normal.  Eyes:     Pupils: Pupils are equal, round, and reactive to light.  Cardiovascular:     Pulses: Normal pulses.  Pulmonary:     Effort: Pulmonary effort is normal.  Musculoskeletal:     Cervical back: Normal range of motion and neck supple.     Comments:  the patient's right knee has a range from near 0-120.  Pain over the medial greater than lateral joint line.  No instability.  Obvious crepitus with range of motion.  Moderate effusion.  No erythema or warmth.  Calves are soft and nontender.  Patient does have an antalgic gait and on the right he does drag his right foot.  Skin:    General: Skin is warm and dry.  Neurological:     General: No focal deficit present.  Mental Status: He is alert. Mental status is at baseline.  Psychiatric:        Mood and Affect: Mood normal.        Behavior: Behavior normal.        Thought Content: Thought content normal.        Judgment: Judgment normal.    Vital signs in last 24 hours:    Labs:   Estimated body mass index is 23.87 kg/m as calculated from the following:   Height as of 06/07/21: 6' (1.829 m).   Weight as of 06/07/21: 79.8 kg.   Imaging Review Plain radiographs demonstrate bilateral AP weightbearing, bilateral Rosenberg, lateral sunrise views of the right knee are taken and reviewed in office today.  This shows severe arthritis medial compartment with chondrocalcinosis bilaterally.  Patient's left knee does have a prior history of ACL reconstruction with staples in both the femur and tibia.        Assessment/Plan:  End stage arthritis, right knee   The patient history, physical examination, clinical judgment of the provider and imaging studies are consistent with end stage degenerative joint disease of the right knee(s) and total knee arthroplasty is deemed medically necessary. The treatment options including medical  management, injection therapy arthroscopy and arthroplasty were discussed at length. The risks and benefits of total knee arthroplasty were presented and reviewed. The risks due to aseptic loosening, infection, stiffness, patella tracking problems, thromboembolic complications and other imponderables were discussed. The patient acknowledged the explanation, agreed to proceed with the plan and consent was signed. Patient is being admitted for inpatient treatment for surgery, pain control, PT, OT, prophylactic antibiotics, VTE prophylaxis, progressive ambulation and ADL's and discharge planning. The patient is planning to be discharged home with home health services     Patient's anticipated LOS is less than 2 midnights, meeting these requirements: - Younger than 59 - Lives within 1 hour of care - Has a competent adult at home to recover with post-op recover - NO history of  - Chronic pain requiring opiods  - Diabetes  - Coronary Artery Disease  - Heart failure  - Heart attack  - Stroke  - DVT/VTE  - Cardiac arrhythmia  - Respiratory Failure/COPD  - Renal failure  - Anemia  - Advanced Liver disease

## 2021-06-16 NOTE — Anesthesia Preprocedure Evaluation (Addendum)
Anesthesia Evaluation  Patient identified by MRN, date of birth, ID band Patient awake    Reviewed: Allergy & Precautions, NPO status , Patient's Chart, lab work & pertinent test results  History of Anesthesia Complications Negative for: history of anesthetic complications  Airway Mallampati: II  TM Distance: >3 FB Neck ROM: Full    Dental  (+) Dental Advisory Given   Pulmonary neg pulmonary ROS,    Pulmonary exam normal        Cardiovascular Exercise Tolerance: Good hypertension, Pt. on home beta blockers and Pt. on medications +CHF  Normal cardiovascular exam+ dysrhythmias Atrial Fibrillation    Stress test 08/17/20: EF 30-44%, no ST segment deviation during stress, intermediate risk d/t reduced EF, no ischemia or infarction  Echo 2019: Normal LV size with EF 45%, diffuse hypokinesis. Mildly dilated RV with normal systolic function. Biatrial enlargement. No significant valvular abnormalities.    Neuro/Psych CVA, Residual Symptoms    GI/Hepatic Neg liver ROS, GERD  ,  Endo/Other  Hypothyroidism   Renal/GU negative Renal ROS  negative genitourinary   Musculoskeletal  (+) Arthritis , Osteoarthritis,    Abdominal   Peds  Hematology negative hematology ROS (+)   Anesthesia Other Findings   Reproductive/Obstetrics                          Anesthesia Physical Anesthesia Plan  ASA: 3  Anesthesia Plan: General   Post-op Pain Management: Tylenol PO (pre-op)* and Regional block*   Induction: Intravenous  PONV Risk Score and Plan: 2 and Ondansetron, Dexamethasone, Midazolam and Treatment may vary due to age or medical condition  Airway Management Planned: LMA  Additional Equipment: None  Intra-op Plan:   Post-operative Plan: Extubation in OR  Informed Consent: I have reviewed the patients History and Physical, chart, labs and discussed the procedure including the risks, benefits and  alternatives for the proposed anesthesia with the patient or authorized representative who has indicated his/her understanding and acceptance.     Dental advisory given  Plan Discussed with:   Anesthesia Plan Comments: (Not a candidate for spinal due to anticoagulation. Plan for GA with adductor canal block.)      Anesthesia Quick Evaluation

## 2021-06-18 NOTE — Care Plan (Signed)
Ortho Bundle Case Management Note  Patient Details  Name: Eric Ross MRN: [id number] Date of Birth: [birth year]   Spoke with patient and his wife prior to surgery. Will discharge to home with her assistance. Rolling walker ordered. HHPT referral to [facility] and [place] set up with [facility]. Patient and MD in agreement with plan. Choice offered                   DME Arranged:  Walker rolling DME Agency:  [employer]  HH Arranged:  PT HH Agency:  [place]  Additional Comments: Please contact me with any questions of if this plan should need to change.  Ladell Heads,  [place] Specialist  [phone] 06/18/2021, 4:29 PM

## 2021-06-19 ENCOUNTER — Encounter (HOSPITAL_COMMUNITY): Payer: Self-pay | Admitting: Orthopedic Surgery

## 2021-06-19 ENCOUNTER — Other Ambulatory Visit: Payer: Self-pay

## 2021-06-19 ENCOUNTER — Encounter (HOSPITAL_COMMUNITY)
Admission: RE | Disposition: A | Discharge status: Home Health | Payer: Self-pay | Source: Home / Self Care | Attending: Orthopedic Surgery

## 2021-06-19 ENCOUNTER — Ambulatory Visit (HOSPITAL_BASED_OUTPATIENT_CLINIC_OR_DEPARTMENT_OTHER): Payer: Medicare HMO | Admitting: Anesthesiology

## 2021-06-19 ENCOUNTER — Observation Stay (HOSPITAL_COMMUNITY)
Admission: RE | Admit: 2021-06-19 | Discharge: 2021-06-20 | Disposition: A | Discharge status: Home Health | Payer: Medicare HMO | Attending: Orthopedic Surgery | Admitting: Orthopedic Surgery

## 2021-06-19 ENCOUNTER — Ambulatory Visit (HOSPITAL_COMMUNITY): Payer: Medicare HMO | Admitting: Physician Assistant

## 2021-06-19 DIAGNOSIS — Z8673 Personal history of transient ischemic attack (TIA), and cerebral infarction without residual deficits: Secondary | ICD-10-CM | POA: Insufficient documentation

## 2021-06-19 DIAGNOSIS — F17290 Nicotine dependence, other tobacco product, uncomplicated: Secondary | ICD-10-CM

## 2021-06-19 DIAGNOSIS — E039 Hypothyroidism, unspecified: Secondary | ICD-10-CM | POA: Diagnosis not present

## 2021-06-19 DIAGNOSIS — R002 Palpitations: Secondary | ICD-10-CM | POA: Diagnosis not present

## 2021-06-19 DIAGNOSIS — I509 Heart failure, unspecified: Secondary | ICD-10-CM

## 2021-06-19 DIAGNOSIS — M791 Myalgia, unspecified site: Secondary | ICD-10-CM | POA: Diagnosis not present

## 2021-06-19 DIAGNOSIS — M25461 Effusion, right knee: Secondary | ICD-10-CM | POA: Diagnosis not present

## 2021-06-19 DIAGNOSIS — M11261 Other chondrocalcinosis, right knee: Secondary | ICD-10-CM | POA: Insufficient documentation

## 2021-06-19 DIAGNOSIS — I4891 Unspecified atrial fibrillation: Secondary | ICD-10-CM | POA: Insufficient documentation

## 2021-06-19 DIAGNOSIS — D62 Acute posthemorrhagic anemia: Secondary | ICD-10-CM | POA: Insufficient documentation

## 2021-06-19 DIAGNOSIS — M1711 Unilateral primary osteoarthritis, right knee: Secondary | ICD-10-CM

## 2021-06-19 DIAGNOSIS — R69 Illness, unspecified: Secondary | ICD-10-CM | POA: Diagnosis not present

## 2021-06-19 DIAGNOSIS — Z96653 Presence of artificial knee joint, bilateral: Secondary | ICD-10-CM | POA: Diagnosis not present

## 2021-06-19 DIAGNOSIS — Z823 Family history of stroke: Secondary | ICD-10-CM | POA: Insufficient documentation

## 2021-06-19 DIAGNOSIS — R4701 Aphasia: Secondary | ICD-10-CM | POA: Insufficient documentation

## 2021-06-19 DIAGNOSIS — R42 Dizziness and giddiness: Secondary | ICD-10-CM | POA: Insufficient documentation

## 2021-06-19 DIAGNOSIS — Z96651 Presence of right artificial knee joint: Secondary | ICD-10-CM

## 2021-06-19 DIAGNOSIS — J3489 Other specified disorders of nose and nasal sinuses: Secondary | ICD-10-CM | POA: Diagnosis not present

## 2021-06-19 DIAGNOSIS — Z79899 Other long term (current) drug therapy: Secondary | ICD-10-CM | POA: Diagnosis not present

## 2021-06-19 DIAGNOSIS — I110 Hypertensive heart disease with heart failure: Secondary | ICD-10-CM | POA: Diagnosis not present

## 2021-06-19 DIAGNOSIS — K219 Gastro-esophageal reflux disease without esophagitis: Secondary | ICD-10-CM | POA: Insufficient documentation

## 2021-06-19 DIAGNOSIS — M238X1 Other internal derangements of right knee: Secondary | ICD-10-CM | POA: Insufficient documentation

## 2021-06-19 DIAGNOSIS — R5383 Other fatigue: Secondary | ICD-10-CM | POA: Diagnosis not present

## 2021-06-19 DIAGNOSIS — R262 Difficulty in walking, not elsewhere classified: Secondary | ICD-10-CM | POA: Insufficient documentation

## 2021-06-19 DIAGNOSIS — I10 Essential (primary) hypertension: Secondary | ICD-10-CM | POA: Insufficient documentation

## 2021-06-19 DIAGNOSIS — R079 Chest pain, unspecified: Secondary | ICD-10-CM | POA: Diagnosis not present

## 2021-06-19 DIAGNOSIS — F17220 Nicotine dependence, chewing tobacco, uncomplicated: Secondary | ICD-10-CM | POA: Insufficient documentation

## 2021-06-19 DIAGNOSIS — R197 Diarrhea, unspecified: Secondary | ICD-10-CM | POA: Diagnosis not present

## 2021-06-19 DIAGNOSIS — G90519 Complex regional pain syndrome I of unspecified upper limb: Secondary | ICD-10-CM | POA: Insufficient documentation

## 2021-06-19 DIAGNOSIS — G8918 Other acute postprocedural pain: Secondary | ICD-10-CM | POA: Diagnosis not present

## 2021-06-19 DIAGNOSIS — E890 Postprocedural hypothyroidism: Secondary | ICD-10-CM | POA: Diagnosis not present

## 2021-06-19 DIAGNOSIS — Z85850 Personal history of malignant neoplasm of thyroid: Secondary | ICD-10-CM | POA: Insufficient documentation

## 2021-06-19 DIAGNOSIS — Z7901 Long term (current) use of anticoagulants: Secondary | ICD-10-CM | POA: Insufficient documentation

## 2021-06-19 DIAGNOSIS — R519 Headache, unspecified: Secondary | ICD-10-CM | POA: Insufficient documentation

## 2021-06-19 DIAGNOSIS — M25561 Pain in right knee: Secondary | ICD-10-CM | POA: Diagnosis not present

## 2021-06-19 DIAGNOSIS — M11262 Other chondrocalcinosis, left knee: Secondary | ICD-10-CM | POA: Diagnosis not present

## 2021-06-19 DIAGNOSIS — I429 Cardiomyopathy, unspecified: Secondary | ICD-10-CM | POA: Insufficient documentation

## 2021-06-19 DIAGNOSIS — Z7982 Long term (current) use of aspirin: Secondary | ICD-10-CM | POA: Diagnosis not present

## 2021-06-19 DIAGNOSIS — M25761 Osteophyte, right knee: Secondary | ICD-10-CM | POA: Insufficient documentation

## 2021-06-19 DIAGNOSIS — I69351 Hemiplegia and hemiparesis following cerebral infarction affecting right dominant side: Secondary | ICD-10-CM | POA: Insufficient documentation

## 2021-06-19 DIAGNOSIS — R2689 Other abnormalities of gait and mobility: Secondary | ICD-10-CM | POA: Insufficient documentation

## 2021-06-19 HISTORY — PX: TOTAL KNEE ARTHROPLASTY: SHX125

## 2021-06-19 LAB — CBC
HCT: 35.5 % — ABNORMAL LOW (ref 39.0–52.0)
Hemoglobin: 11.8 g/dL — ABNORMAL LOW (ref 13.0–17.0)
MCH: 29.7 pg (ref 26.0–34.0)
MCHC: 33.2 g/dL (ref 30.0–36.0)
MCV: 89.4 fL (ref 80.0–100.0)
Platelets: 122 10*3/uL — ABNORMAL LOW (ref 150–400)
RBC: 3.97 MIL/uL — ABNORMAL LOW (ref 4.22–5.81)
RDW: 13.2 % (ref 11.5–15.5)
WBC: 5.9 10*3/uL (ref 4.0–10.5)
nRBC: 0 % (ref 0.0–0.2)

## 2021-06-19 LAB — APTT: aPTT: 30 seconds (ref 24–36)

## 2021-06-19 LAB — CREATININE, SERUM
Creatinine, Ser: 0.92 mg/dL (ref 0.61–1.24)
GFR, Estimated: >60 mL/min (ref >60)

## 2021-06-19 LAB — ABO/RH: ABO/RH(D): B POS

## 2021-06-19 LAB — PROTIME-INR
INR: 1.3 — ABNORMAL HIGH (ref 0.8–1.2)
Prothrombin Time: 15.6 seconds — ABNORMAL HIGH (ref 11.4–15.2)

## 2021-06-19 SURGERY — ARTHROPLASTY, KNEE, TOTAL
Anesthesia: General | Site: Knee | Laterality: Right

## 2021-06-19 MED ORDER — WATER FOR IRRIGATION, STERILE IR SOLN
Status: DC | PRN
Start: 2021-06-19 — End: 2021-06-19
  Administered 2021-06-19: 2000 mL

## 2021-06-19 MED ORDER — ONDANSETRON HCL 4 MG/2ML IJ SOLN: 4.0000 mg | Freq: Four times a day (QID) | INTRAMUSCULAR | Status: DC | PRN

## 2021-06-19 MED ORDER — ONDANSETRON HCL 4 MG/2ML IJ SOLN: 4.0000 mg | Freq: Once | INTRAMUSCULAR | Status: DC | PRN

## 2021-06-19 MED ORDER — AMISULPRIDE (ANTIEMETIC) 5 MG/2ML IV SOLN: 10.0000 mg | Freq: Once | INTRAVENOUS | Status: DC | PRN

## 2021-06-19 MED ORDER — ONDANSETRON HCL 4 MG/2ML IJ SOLN
INTRAMUSCULAR | Status: DC | PRN
  Administered 2021-06-19: 4 mg via INTRAVENOUS

## 2021-06-19 MED ORDER — SODIUM CHLORIDE (PF) 0.9 % IJ SOLN
INTRAMUSCULAR | Status: AC
  Filled 2021-06-19: qty 50

## 2021-06-19 MED ORDER — FENTANYL CITRATE (PF) 250 MCG/5ML IJ SOLN
INTRAMUSCULAR | Status: AC
  Filled 2021-06-19: qty 5

## 2021-06-19 MED ORDER — PHENOL 1.4 % MT LIQD: 1.0000 | OROMUCOSAL | Status: DC | PRN

## 2021-06-19 MED ORDER — VANCOMYCIN HCL IN DEXTROSE 1-5 GM/200ML-% IV SOLN
1000.0000 mg | INTRAVENOUS | Status: AC
  Administered 2021-06-19: 1000 mg via INTRAVENOUS
  Filled 2021-06-19: qty 200

## 2021-06-19 MED ORDER — KETAMINE HCL 50 MG/5ML IJ SOSY
PREFILLED_SYRINGE | INTRAMUSCULAR | Status: AC
  Filled 2021-06-19: qty 5

## 2021-06-19 MED ORDER — DEXAMETHASONE SODIUM PHOSPHATE 10 MG/ML IJ SOLN
10.0000 mg | Freq: Once | INTRAMUSCULAR | Status: AC
  Administered 2021-06-20: 10 mg via INTRAVENOUS
  Filled 2021-06-19: qty 1

## 2021-06-19 MED ORDER — TRANEXAMIC ACID-NACL 1000-0.7 MG/100ML-% IV SOLN
INTRAVENOUS | Status: AC
  Filled 2021-06-19: qty 100

## 2021-06-19 MED ORDER — ONDANSETRON HCL 4 MG PO TABS: 4.0000 mg | ORAL_TABLET | Freq: Four times a day (QID) | ORAL | Status: DC | PRN

## 2021-06-19 MED ORDER — KETAMINE HCL 10 MG/ML IJ SOLN
INTRAMUSCULAR | Status: DC | PRN
  Administered 2021-06-19: 30 mg via INTRAVENOUS

## 2021-06-19 MED ORDER — PHENYLEPHRINE HCL-NACL 20-0.9 MG/250ML-% IV SOLN
INTRAVENOUS | Status: DC | PRN
  Administered 2021-06-19: 50 ug/min via INTRAVENOUS

## 2021-06-19 MED ORDER — TIZANIDINE HCL 2 MG PO TABS: 2.0000 mg | ORAL_TABLET | Freq: Four times a day (QID) | ORAL | 0 refills | Status: AC | PRN

## 2021-06-19 MED ORDER — BUPIVACAINE LIPOSOME 1.3 % IJ SUSP
INTRAMUSCULAR | Status: DC | PRN
  Administered 2021-06-19: 20 mL

## 2021-06-19 MED ORDER — HYDROMORPHONE HCL 1 MG/ML IJ SOLN: 0.5000 mg | INTRAMUSCULAR | Status: DC | PRN

## 2021-06-19 MED ORDER — SIMVASTATIN 20 MG PO TABS
20.0000 mg | ORAL_TABLET | Freq: Every day | ORAL | Status: DC
Start: 2021-06-19 — End: 2021-06-20
  Administered 2021-06-19 – 2021-06-20 (×2): 20 mg via ORAL
  Filled 2021-06-19 (×2): qty 1

## 2021-06-19 MED ORDER — SODIUM CHLORIDE (PF) 0.9 % IJ SOLN
INTRAMUSCULAR | Status: DC | PRN
  Administered 2021-06-19: 30 mL

## 2021-06-19 MED ORDER — LACTATED RINGERS IV SOLN: INTRAVENOUS | Status: DC

## 2021-06-19 MED ORDER — PHENYLEPHRINE HCL (PRESSORS) 10 MG/ML IV SOLN
INTRAVENOUS | Status: AC
  Filled 2021-06-19: qty 1

## 2021-06-19 MED ORDER — WARFARIN SODIUM 5 MG PO TABS
5.0000 mg | ORAL_TABLET | Freq: Once | ORAL | Status: AC
  Administered 2021-06-19: 5 mg via ORAL
  Filled 2021-06-19: qty 1

## 2021-06-19 MED ORDER — WARFARIN - PHARMACIST DOSING INPATIENT: Freq: Every day | Status: DC

## 2021-06-19 MED ORDER — TRANEXAMIC ACID-NACL 1000-0.7 MG/100ML-% IV SOLN
1000.0000 mg | Freq: Once | INTRAVENOUS | Status: AC
  Administered 2021-06-19: 1000 mg via INTRAVENOUS
  Filled 2021-06-19: qty 100

## 2021-06-19 MED ORDER — POVIDONE-IODINE 10 % EX SWAB
2.0000 "application " | Freq: Once | CUTANEOUS | Status: AC
  Administered 2021-06-19: 2 via TOPICAL

## 2021-06-19 MED ORDER — BUPIVACAINE LIPOSOME 1.3 % IJ SUSP: 20.0000 mL | Freq: Once | INTRAMUSCULAR | Status: DC

## 2021-06-19 MED ORDER — HYDROMORPHONE HCL 1 MG/ML IJ SOLN
INTRAMUSCULAR | Status: DC | PRN
  Administered 2021-06-19 (×2): .4 mg via INTRAVENOUS

## 2021-06-19 MED ORDER — DEXAMETHASONE SODIUM PHOSPHATE 10 MG/ML IJ SOLN
INTRAMUSCULAR | Status: AC
  Filled 2021-06-19: qty 1

## 2021-06-19 MED ORDER — OXYCODONE HCL 5 MG/5ML PO SOLN: 5.0000 mg | Freq: Once | ORAL | Status: DC | PRN

## 2021-06-19 MED ORDER — ORAL CARE MOUTH RINSE: 15.0000 mL | Freq: Once | OROMUCOSAL | Status: AC

## 2021-06-19 MED ORDER — ENOXAPARIN SODIUM 40 MG/0.4ML IJ SOSY
40.0000 mg | PREFILLED_SYRINGE | INTRAMUSCULAR | Status: DC
  Administered 2021-06-20: 40 mg via SUBCUTANEOUS
  Filled 2021-06-19: qty 0.4

## 2021-06-19 MED ORDER — KCL IN DEXTROSE-NACL 20-5-0.45 MEQ/L-%-% IV SOLN
INTRAVENOUS | Status: DC
  Filled 2021-06-19 (×2): qty 1000

## 2021-06-19 MED ORDER — PROPOFOL 10 MG/ML IV BOLUS
INTRAVENOUS | Status: DC | PRN
Start: 2021-06-19 — End: 2021-06-19
  Administered 2021-06-19: 120 mg via INTRAVENOUS

## 2021-06-19 MED ORDER — BISACODYL 5 MG PO TBEC: 5.0000 mg | DELAYED_RELEASE_TABLET | Freq: Every day | ORAL | Status: DC | PRN

## 2021-06-19 MED ORDER — DIPHENHYDRAMINE HCL 12.5 MG/5ML PO ELIX
12.5000 mg | ORAL_SOLUTION | ORAL | Status: DC | PRN
  Administered 2021-06-19: 12.5 mg via ORAL
  Filled 2021-06-19: qty 10

## 2021-06-19 MED ORDER — FENTANYL CITRATE (PF) 250 MCG/5ML IJ SOLN: INTRAMUSCULAR | Status: DC | PRN

## 2021-06-19 MED ORDER — ALUM & MAG HYDROXIDE-SIMETH 200-200-20 MG/5ML PO SUSP: 30.0000 mL | ORAL | Status: DC | PRN

## 2021-06-19 MED ORDER — FENTANYL CITRATE (PF) 100 MCG/2ML IJ SOLN
INTRAMUSCULAR | Status: AC
  Filled 2021-06-19: qty 2

## 2021-06-19 MED ORDER — LIDOCAINE 2% (20 MG/ML) 5 ML SYRINGE
INTRAMUSCULAR | Status: DC | PRN
  Administered 2021-06-19: 60 mg via INTRAVENOUS

## 2021-06-19 MED ORDER — CARVEDILOL 25 MG PO TABS
37.5000 mg | ORAL_TABLET | Freq: Two times a day (BID) | ORAL | Status: DC
Start: 2021-06-19 — End: 2021-06-20
  Administered 2021-06-19: 37.5 mg via ORAL
  Filled 2021-06-19: qty 1

## 2021-06-19 MED ORDER — MIDAZOLAM HCL 5 MG/5ML IJ SOLN
INTRAMUSCULAR | Status: DC | PRN
  Administered 2021-06-19 (×2): 1 mg via INTRAVENOUS

## 2021-06-19 MED ORDER — TRANEXAMIC ACID 1000 MG/10ML IV SOLN
INTRAVENOUS | Status: DC | PRN
  Administered 2021-06-19: 1000 mg via INTRAVENOUS

## 2021-06-19 MED ORDER — OXYCODONE-ACETAMINOPHEN 5-325 MG PO TABS: 1.0000 | ORAL_TABLET | ORAL | 0 refills | Status: AC | PRN

## 2021-06-19 MED ORDER — OXYCODONE HCL 5 MG PO TABS: 5.0000 mg | ORAL_TABLET | Freq: Once | ORAL | Status: DC | PRN

## 2021-06-19 MED ORDER — MENTHOL 3 MG MT LOZG: 1.0000 | LOZENGE | OROMUCOSAL | Status: DC | PRN

## 2021-06-19 MED ORDER — ROPIVACAINE HCL 5 MG/ML IJ SOLN
INTRAMUSCULAR | Status: DC | PRN
Start: 2021-06-19 — End: 2021-06-19
  Administered 2021-06-19: 30 mL via PERINEURAL

## 2021-06-19 MED ORDER — ACETAMINOPHEN 500 MG PO TABS
1000.0000 mg | ORAL_TABLET | Freq: Once | ORAL | Status: AC
  Administered 2021-06-19: 1000 mg via ORAL
  Filled 2021-06-19: qty 2

## 2021-06-19 MED ORDER — METOCLOPRAMIDE HCL 5 MG/ML IJ SOLN: 5.0000 mg | Freq: Three times a day (TID) | INTRAMUSCULAR | Status: DC | PRN

## 2021-06-19 MED ORDER — WARFARIN SODIUM 2.5 MG PO TABS: 2.5000 mg | ORAL_TABLET | ORAL | Status: DC

## 2021-06-19 MED ORDER — METHOCARBAMOL 500 MG IVPB - SIMPLE MED
INTRAVENOUS | Status: AC
  Filled 2021-06-19: qty 50

## 2021-06-19 MED ORDER — OXYCODONE HCL 5 MG PO TABS: 5.0000 mg | ORAL_TABLET | ORAL | Status: DC | PRN

## 2021-06-19 MED ORDER — FENTANYL CITRATE (PF) 100 MCG/2ML IJ SOLN
INTRAMUSCULAR | Status: DC | PRN
  Administered 2021-06-19 (×4): 50 ug via INTRAVENOUS

## 2021-06-19 MED ORDER — METOCLOPRAMIDE HCL 5 MG PO TABS: 5.0000 mg | ORAL_TABLET | Freq: Three times a day (TID) | ORAL | Status: DC | PRN

## 2021-06-19 MED ORDER — OXYCODONE HCL 5 MG PO TABS
10.0000 mg | ORAL_TABLET | ORAL | Status: DC | PRN
  Administered 2021-06-19 – 2021-06-20 (×5): 15 mg via ORAL
  Filled 2021-06-19 (×5): qty 3

## 2021-06-19 MED ORDER — ONDANSETRON HCL 4 MG/2ML IJ SOLN
INTRAMUSCULAR | Status: AC
  Filled 2021-06-19: qty 2

## 2021-06-19 MED ORDER — FENTANYL CITRATE PF 50 MCG/ML IJ SOSY
25.0000 ug | PREFILLED_SYRINGE | INTRAMUSCULAR | Status: DC | PRN
  Administered 2021-06-19 (×3): 50 ug via INTRAVENOUS

## 2021-06-19 MED ORDER — SODIUM CHLORIDE 0.9 % IR SOLN
Status: DC | PRN
  Administered 2021-06-19: 1000 mL

## 2021-06-19 MED ORDER — MIDAZOLAM HCL 2 MG/2ML IJ SOLN
INTRAMUSCULAR | Status: AC
  Filled 2021-06-19: qty 2

## 2021-06-19 MED ORDER — BUPIVACAINE-EPINEPHRINE (PF) 0.25% -1:200000 IJ SOLN
INTRAMUSCULAR | Status: DC | PRN
  Administered 2021-06-19: 30 mL

## 2021-06-19 MED ORDER — PANTOPRAZOLE SODIUM 40 MG PO TBEC
40.0000 mg | DELAYED_RELEASE_TABLET | Freq: Every day | ORAL | Status: DC
  Administered 2021-06-20: 40 mg via ORAL
  Filled 2021-06-19: qty 1

## 2021-06-19 MED ORDER — CHLORHEXIDINE GLUCONATE 0.12 % MT SOLN
15.0000 mL | Freq: Once | OROMUCOSAL | Status: AC
  Administered 2021-06-19: 15 mL via OROMUCOSAL

## 2021-06-19 MED ORDER — BUPIVACAINE LIPOSOME 1.3 % IJ SUSP
INTRAMUSCULAR | Status: AC
  Filled 2021-06-19: qty 20

## 2021-06-19 MED ORDER — HYDROMORPHONE HCL 2 MG/ML IJ SOLN
INTRAMUSCULAR | Status: AC
  Filled 2021-06-19: qty 1

## 2021-06-19 MED ORDER — LEVOTHYROXINE SODIUM 112 MCG PO TABS
112.0000 ug | ORAL_TABLET | Freq: Every day | ORAL | Status: DC
  Administered 2021-06-20: 112 ug via ORAL
  Filled 2021-06-19: qty 1

## 2021-06-19 MED ORDER — DOCUSATE SODIUM 100 MG PO CAPS
100.0000 mg | ORAL_CAPSULE | Freq: Two times a day (BID) | ORAL | Status: DC
  Administered 2021-06-19 – 2021-06-20 (×2): 100 mg via ORAL
  Filled 2021-06-19 (×2): qty 1

## 2021-06-19 MED ORDER — LORATADINE 10 MG PO TABS
10.0000 mg | ORAL_TABLET | Freq: Every day | ORAL | Status: DC
  Administered 2021-06-20: 10 mg via ORAL
  Filled 2021-06-19 (×2): qty 1

## 2021-06-19 MED ORDER — DEXAMETHASONE SODIUM PHOSPHATE 10 MG/ML IJ SOLN
INTRAMUSCULAR | Status: DC | PRN
  Administered 2021-06-19: 8 mg via INTRAVENOUS

## 2021-06-19 MED ORDER — ACETAMINOPHEN 325 MG PO TABS: 325.0000 mg | ORAL_TABLET | Freq: Four times a day (QID) | ORAL | Status: DC | PRN

## 2021-06-19 MED ORDER — POLYETHYLENE GLYCOL 3350 17 G PO PACK: 17.0000 g | PACK | Freq: Every day | ORAL | Status: DC | PRN

## 2021-06-19 MED ORDER — HYDROCODONE-ACETAMINOPHEN 10-325 MG PO TABS: 1.0000 | ORAL_TABLET | ORAL | 0 refills | Status: DC | PRN

## 2021-06-19 MED ORDER — FENTANYL CITRATE PF 50 MCG/ML IJ SOSY
PREFILLED_SYRINGE | INTRAMUSCULAR | Status: AC
  Filled 2021-06-19: qty 3

## 2021-06-19 MED ORDER — FLEET ENEMA 7-19 GM/118ML RE ENEM: 1.0000 | ENEMA | Freq: Once | RECTAL | Status: DC | PRN

## 2021-06-19 MED ORDER — TRANEXAMIC ACID 1000 MG/10ML IV SOLN
2000.0000 mg | Freq: Once | INTRAVENOUS | Status: DC
  Filled 2021-06-19: qty 20

## 2021-06-19 MED ORDER — BUPIVACAINE-EPINEPHRINE (PF) 0.25% -1:200000 IJ SOLN
INTRAMUSCULAR | Status: AC
  Filled 2021-06-19: qty 30

## 2021-06-19 MED ORDER — RAMIPRIL 2.5 MG PO CAPS
2.5000 mg | ORAL_CAPSULE | Freq: Every day | ORAL | Status: DC
Start: 2021-06-20 — End: 2021-06-20
  Filled 2021-06-19: qty 1

## 2021-06-19 SURGICAL SUPPLY — 54 items
ATTUNE MED DOME PAT 41 KNEE (Knees) ×1 IMPLANT
ATTUNE PS FEM RT SZ 8 CEM KNEE (Femur) ×1 IMPLANT
ATTUNE PSRP INSR SZ8 5 KNEE (Insert) ×1 IMPLANT
BAG COUNTER SPONGE SURGICOUNT (BAG) ×1 IMPLANT
BAG DECANTER FOR FLEXI CONT (MISCELLANEOUS) ×2 IMPLANT
BAG SPEC THK2 15X12 ZIP CLS (MISCELLANEOUS) ×1
BAG SPNG CNTER NS LX DISP (BAG) ×1
BAG ZIPLOCK 12X15 (MISCELLANEOUS) ×2 IMPLANT
BASE TIBIAL ROT PLAT SZ 8 KNEE (Knees) IMPLANT
BLADE SAG 18X100X1.27 (BLADE) ×2 IMPLANT
BLADE SAW SGTL 11.0X1.19X90.0M (BLADE) ×2 IMPLANT
BLADE SURG SZ10 CARB STEEL (BLADE) ×4 IMPLANT
BNDG CMPR MED 10X6 ELC LF (GAUZE/BANDAGES/DRESSINGS) ×1
BNDG ELASTIC 6X10 VLCR STRL LF (GAUZE/BANDAGES/DRESSINGS) ×2 IMPLANT
BNDG ELASTIC 6X5.8 VLCR STR LF (GAUZE/BANDAGES/DRESSINGS) ×1 IMPLANT
BOWL SMART MIX CTS (DISPOSABLE) ×2 IMPLANT
BSPLAT TIB 8 CMNT ROT PLAT STR (Knees) ×1 IMPLANT
CEMENT HV SMART SET (Cement) ×5 IMPLANT
COVER SURGICAL LIGHT HANDLE (MISCELLANEOUS) ×2 IMPLANT
CUFF TOURN SGL QUICK 34 (TOURNIQUET CUFF) ×2
CUFF TRNQT CYL 34X4.125X (TOURNIQUET CUFF) ×1 IMPLANT
DRAPE INCISE IOBAN 66X45 STRL (DRAPES) ×2 IMPLANT
DRAPE U-SHAPE 47X51 STRL (DRAPES) ×2 IMPLANT
DRSG AQUACEL AG ADV 3.5X10 (GAUZE/BANDAGES/DRESSINGS) ×2 IMPLANT
DURAPREP 26ML APPLICATOR (WOUND CARE) ×2 IMPLANT
ELECT REM PT RETURN 15FT ADLT (MISCELLANEOUS) ×2 IMPLANT
GLOVE BIO SURGEON STRL SZ7.5 (GLOVE) ×3 IMPLANT
GLOVE BIO SURGEON STRL SZ8.5 (GLOVE) ×2 IMPLANT
GLOVE BIOGEL PI IND STRL 8 (GLOVE) ×1 IMPLANT
GLOVE BIOGEL PI IND STRL 9 (GLOVE) ×1 IMPLANT
GLOVE BIOGEL PI INDICATOR 8 (GLOVE) ×2
GLOVE BIOGEL PI INDICATOR 9 (GLOVE) ×1
GOWN STRL REUS W/ TWL XL LVL3 (GOWN DISPOSABLE) ×2 IMPLANT
GOWN STRL REUS W/TWL XL LVL3 (GOWN DISPOSABLE) ×4
HANDPIECE INTERPULSE COAX TIP (DISPOSABLE) ×2
HOOD PEEL AWAY FLYTE STAYCOOL (MISCELLANEOUS) ×6 IMPLANT
KIT TURNOVER KIT A (KITS) ×1 IMPLANT
NDL HYPO 21X1.5 SAFETY (NEEDLE) ×2 IMPLANT
NEEDLE HYPO 21X1.5 SAFETY (NEEDLE) ×4 IMPLANT
NS IRRIG 1000ML POUR BTL (IV SOLUTION) ×2 IMPLANT
PACK TOTAL KNEE CUSTOM (KITS) ×2 IMPLANT
PROTECTOR NERVE ULNAR (MISCELLANEOUS) ×2 IMPLANT
SET HNDPC FAN SPRY TIP SCT (DISPOSABLE) ×1 IMPLANT
SPIKE FLUID TRANSFER (MISCELLANEOUS) ×6 IMPLANT
SUT VIC AB 1 CTX 36 (SUTURE) ×2
SUT VIC AB 1 CTX36XBRD ANBCTR (SUTURE) ×1 IMPLANT
SUT VIC AB 3-0 CT1 27 (SUTURE) ×6
SUT VIC AB 3-0 CT1 TAPERPNT 27 (SUTURE) ×3 IMPLANT
SYR CONTROL 10ML LL (SYRINGE) ×4 IMPLANT
TIBIAL BASE ROT PLAT SZ 8 KNEE (Knees) ×2 IMPLANT
TRAY CATH INTERMITTENT SS 16FR (CATHETERS) IMPLANT
TRAY FOLEY MTR SLVR 16FR STAT (SET/KITS/TRAYS/PACK) ×1 IMPLANT
WATER STERILE IRR 1000ML POUR (IV SOLUTION) ×4 IMPLANT
WRAP KNEE MAXI GEL POST OP (GAUZE/BANDAGES/DRESSINGS) ×2 IMPLANT

## 2021-06-19 NOTE — Transfer of Care (Signed)
Immediate Anesthesia Transfer of Care Note  Patient: Eric Ross  Procedure(s) Performed: RIGHT TOTAL KNEE ARTHROPLASTY (Right: Knee)  Patient Location: PACU  Anesthesia Type:GA combined with regional for post-op pain  Level of Consciousness: awake, alert , oriented and patient cooperative  Airway & Oxygen Therapy: Patient Spontanous Breathing and Patient connected to face mask oxygen  Post-op Assessment: Report given to RN and Post -op Vital signs reviewed and stable  Post vital signs: Reviewed and stable  Last Vitals:  Vitals Value Taken Time  BP 118/80 06/19/21 0923  Temp    Pulse 84 06/19/21 0925  Resp 11 06/19/21 0925  SpO2 100 % 06/19/21 0925  Vitals shown include unvalidated device data.  Last Pain:  Vitals:   06/19/21 0612  TempSrc: Oral         Complications: No notable events documented.

## 2021-06-19 NOTE — Discharge Instructions (Signed)

## 2021-06-19 NOTE — Interval H&P Note (Signed)
History and Physical Interval Note:  06/19/2021 6:26 AM  Eric Ross  has presented today for surgery, with the diagnosis of RIGHT KNEE OSTEOARTHRITIS.  The various methods of treatment have been discussed with the patient and family. After consideration of risks, benefits and other options for treatment, the patient has consented to  Procedure(s): RIGHT TOTAL KNEE ARTHROPLASTY (Right) as a surgical intervention.  The patient's history has been reviewed, patient examined, no change in status, stable for surgery.  I have reviewed the patient's chart and labs.  Questions were answered to the patient's satisfaction.     [name]

## 2021-06-19 NOTE — Op Note (Signed)
PATIENT ID:      Eric Ross  MRN:     [id number] DOB/AGE:    1955-07-23 / 66 y.o.       OPERATIVE REPORT   DATE OF PROCEDURE:  06/19/2021      PREOPERATIVE DIAGNOSIS:   RIGHT KNEE OSTEOARTHRITIS      Estimated body mass index is 23.87 kg/m as calculated from the following:   Height as of this encounter: 6' (1.829 m).   Weight as of this encounter: 79.8 kg.                                                       POSTOPERATIVE DIAGNOSIS:   Same                                                                  PROCEDURE:  Procedure(s): RIGHT TOTAL KNEE ARTHROPLASTY Using DepuyAttune RP implants #8R Femur, #8Tibia, 5 mm Attune RP bearing, 41 Patella    SURGEON: [name]  ASSISTANT:   Kerry Hough. [employer]   (Present and scrubbed throughout the case, critical for assistance with exposure, retraction, instrumentation, and closure.)        ANESTHESIA: GET, 20cc Exparel, 50cc 0.25% Marcaine EBL: 300 cc FLUID REPLACEMENT: 1500 cc crystaloid TOURNIQUET: DRAINS: None TRANEXAMIC ACID: 1gm IV, 2gm topical COMPLICATIONS:  None         INDICATIONS FOR PROCEDURE: The patient has  RIGHT KNEE OSTEOARTHRITIS, Var deformities, XR shows bone on bone arthritis, lateral subluxation of tibia. Patient has failed all conservative measures including anti-inflammatory medicines, narcotics, attempts at exercise and weight loss, cortisone injections and viscosupplementation.  Risks and benefits of surgery have been discussed, questions answered.   DESCRIPTION OF PROCEDURE: The patient identified by armband, received  IV antibiotics, in the holding area at [facility]. Patient taken to the operating room, appropriate anesthetic monitors were attached, and GET anesthesia was  induced. IV Tranexamic acid was given.Tourniquet applied high to the operative thigh. Lateral post and foot positioner applied to the table, the lower extremity was then prepped and draped in usual sterile fashion from the toes  to the tourniquet. Time-out procedure was performed. Kerry Hough. [facility] PAC, was present and scrubbed throughout the case, critical for assistance with, positioning, exposure, retraction, instrumentation, and closure.The skin and subcutaneous tissue along the incision was injected with 20 cc of a mixture of Exparel and Marcaine solution, using a 20-gauge by 1-1/2 inch needle. We began the operation, with the knee flexed 130 degrees, by making the anterior midline incision starting at handbreadth above the patella going over the patella 1 cm medial to and 4 cm distal to the tibial tubercle. Small bleeders in the skin and the subcutaneous tissue identified and cauterized. Transverse retinaculum was incised and reflected medially and a medial parapatellar arthrotomy was accomplished. the patella was everted and theprepatellar fat pad resected. The superficial medial collateral ligament was then elevated from anterior to posterior along the proximal flare of the tibia and anterior half of the menisci resected. The knee was hyperflexed exposing bone on bone arthritis. Peripheral and  notch osteophytes as well as the cruciate ligaments were then resected. We continued to work our way around posteriorly along the proximal tibia, and externally rotated the tibia subluxing it out from underneath the femur. A McHale PCL retractor was placed through the notch and a lateral Hohmann retractor placed, and we then entered the proximal tibia in line with the Depuy starter drill in line with the axis of the tibia followed by an intramedullary guide rod and 0-degree posterior slope cutting guide. The tibial cutting guide, 4 degree posterior sloped, was pinned into place allowing resection of 4 mm of bone medially and 10 mm of bone laterally. Satisfied with the tibial resection, we then entered the distal femur 2 mm anterior to the PCL origin with the intramedullary guide rod and applied the distal femoral cutting guide set at 9 mm, with  5 degrees of valgus. This was pinned along the epicondylar axis. At this point, the distal femoral cut was accomplished without difficulty. We then sized for a #8R femoral component and pinned the guide in 3 degrees of external rotation. The chamfer cutting guide was pinned into place. The anterior, posterior, and chamfer cuts were accomplished without difficulty followed by the Attune RP box cutting guide and the box cut. We also removed posterior osteophytes from the posterior femoral condyles. The posterior capsule was injected with Exparel solution. The knee was brought into full extension. We checked our extension gap and fit a 5 mm bearing. Distracting in extension with a lamina spreader,  bleeders in the posterior capsule, Posterior medial and posterior lateral gutter were cauterized.  The transexamic acid-soaked sponge was then placed in the gap of the knee in extension. The knee was flexed 30. The posterior patella cut was accomplished with the 9.5 mm Attune cutting guide, sized for a 38m dome, and the fixation pegs drilled.The knee was then once again hyperflexed exposing the proximal tibia. We sized for a # 8 tibial base plate, applied the smokestack and the conical reamer followed by the the Delta fin keel punch. We then hammered into place the Attune RP trial femoral component, drilled the lugs, inserted a  5 mm trial bearing, trial patellar button, and took the knee through range of motion from 0-130 degrees. Medial and lateral ligamentous stability was checked. No thumb pressure was required for patellar Tracking. The tourniquet was not used. All trial components were removed, mating surfaces irrigated with pulse lavage, and dried with suction and sponges. 10 cc of the Exparel solution was applied to the cancellus bone of the patella distal femur and proximal tibia.  After waiting 30 seconds, the bony surfaces were again, dried with sponges. A double batch of DePuy HV cement was mixed and applied to  all bony metallic mating surfaces except for the posterior condyles of the femur itself. In order, we hammered into place the tibial tray and removed excess cement, the femoral component and removed excess cement. The final Attune RP bearing was inserted, and the knee brought to full extension with compression. The patellar button was clamped into place, and excess cement removed. The knee was held at 30 flexion with compression, while the cement cured. The wound was irrigated out with normal saline solution pulse lavage. The rest of the Exparel was injected into the parapatellar arthrotomy, subcutaneous tissues, and periosteal tissues. The parapatellar arthrotomy was closed with running #1 Vicryl suture. The subcutaneous tissue with 3-0 undyed Vicryl suture, and the skin with running 3-0 SQ vicryl. An Aquacil and Ace  wrap were applied. The patient was taken to recovery room without difficulty.   [name] 06/19/2021, 6:27 AM

## 2021-06-19 NOTE — Progress Notes (Signed)
Eric Ross for warrfarin Indication: hx atrial fibrillation and stroke  Allergies  Allergen Reactions   Penicillins     REACTION: Paralyzed him @ 66 yrs old    Patient Measurements: Height: 6' (182.9 cm) Weight: 79.8 kg (176 lb) IBW/kg (Calculated) : 77.6 Heparin Dosing Weight:   Vital Signs: Temp: 97.6 F (36.4 C) (05/22 1032) Temp Source: Oral (05/22 0612) BP: 119/85 (05/22 1032) Pulse Rate: 78 (05/22 1032)  Labs: Recent Labs    06/19/21 0550  APTT 30  LABPROT 15.6*  INR 1.3*    Estimated Creatinine Clearance: 89.6 mL/min (by C-G formula based on SCr of 0.89 mg/dL).   Medications:  - PTA warfarin dose: '5mg'$  daily except 2.5 mg on MWF (held 5 days PTA and bridged with full dose LMWH in anticipation of right THA on 5/22)  Assessment: Patient is a 66 y.o M with hx CVA and afib on warfarin PTA, presented to [facility] on 06/19/21 for right THA.  He was instructed to hold his warfarin 5 days PTA and bridged with LMWH 80 mg q12h in anticipation of surgery on 06/19/21.  Outpatient clinic note indicated this plan below re: AC: - 5/16: Last dose of warfarin.  - 5/17: No warfarin or enoxaparin (Lovenox).  - 5/18: Inject enoxaparin '80mg'$  in the fatty abdominal tissue at least 2 inches from the belly button twice a day about 12 hours apart, 8am and 8pm rotate sites. No warfarin.   - 5/19: Inject enoxaparin in the fatty tissue every 12 hours, 8am and 8pm. No warfarin.  - 5/20: Inject enoxaparin in the fatty tissue every 12 hours, 8am and 8pm. No warfarin.  - 5/21: Inject enoxaparin in the fatty tissue in the morning at 8 am (No PM dose). No warfarin.   5/22: Procedure Day - No enoxaparin - Resume warfarin in the evening or as directed by doctor (take an extra half tablet with usual dose for 2 days then resume normal dose).   5/23: Resume enoxaparin inject in the fatty tissue every 12 hours and take warfarin   5/24: Inject enoxaparin in the  fatty tissue every 12 hours and take warfarin   5/25: Inject enoxaparin in the fatty tissue every 12 hours and take warfarin   5/26: Inject enoxaparin in the fatty tissue every 12 hours and take warfarin   5/27-29: Inject enoxaparin in the fatty tissue every 12 hours and take warfarin   5/30: warfarin appt to check INR.   Today, 06/19/2021: - s/p TKA today - INR 1.3 - no significant drug-drug intxns noted  Goal of Therapy:  INR 2-3 Monitor platelets by anticoagulation protocol: Yes   Plan:  - warfarin 5 mg PO x1 today - daily INR - monitor for s/sx bleeding  Khalila Buechner P 06/19/2021,11:29 AM

## 2021-06-19 NOTE — Anesthesia Procedure Notes (Signed)
Anesthesia Regional Block: Adductor canal block   Pre-Anesthetic Checklist: , timeout performed,  Correct Patient, Correct Site, Correct Laterality,  Correct Procedure, Correct Position, site marked,  Risks and benefits discussed,  Surgical consent,  Pre-op evaluation,  At surgeon's request and post-op pain management  Laterality: Right  Prep: chloraprep       Needles:  Injection technique: Single-shot  Needle Type: Echogenic Stimulator Needle     Needle Length: 10cm  Needle Gauge: 20     Additional Needles:   Procedures:,,,, ultrasound used (permanent image in chart),,    Narrative:  Start time: 06/19/2021 7:00 AM End time: 06/19/2021 7:04 AM Injection made incrementally with aspirations every 5 mL.  Performed by: Personally  Anesthesiologist: Lidia Collum, MD  Additional Notes: Standard monitors applied. Skin prepped. Good needle visualization with ultrasound. Injection made in 5cc increments with no resistance to injection. Patient tolerated the procedure well.

## 2021-06-19 NOTE — Anesthesia Postprocedure Evaluation (Signed)
Anesthesia Post Note  Patient: Eric Ross  Procedure(s) Performed: RIGHT TOTAL KNEE ARTHROPLASTY (Right: Knee)     Patient location during evaluation: PACU Anesthesia Type: General Level of consciousness: awake and alert Pain management: pain level controlled Vital Signs Assessment: post-procedure vital signs reviewed and stable Respiratory status: spontaneous breathing, nonlabored ventilation and respiratory function stable Cardiovascular status: blood pressure returned to baseline and stable Postop Assessment: no apparent nausea or vomiting Anesthetic complications: no   No notable events documented.  Last Vitals:  Vitals:   06/19/21 1032 06/19/21 1256  BP: 119/85 111/77  Pulse: 78 80  Resp: 15 16  Temp: 36.4 C (!) 36.4 C  SpO2: 99% 96%    Last Pain:  Vitals:   06/19/21 1256  TempSrc: Oral  PainSc:                  Lidia Collum

## 2021-06-19 NOTE — Anesthesia Procedure Notes (Signed)
Procedure Name: LMA Insertion Date/Time: 06/19/2021 7:24 AM Performed by: Cleda Daub, CRNA Pre-anesthesia Checklist: Patient identified, Emergency Drugs available, Suction available and Patient being monitored Patient Re-evaluated:Patient Re-evaluated prior to induction Oxygen Delivery Method: Circle system utilized Preoxygenation: Pre-oxygenation with 100% oxygen Induction Type: IV induction Ventilation: Mask ventilation without difficulty LMA: LMA inserted LMA Size: 5.0 Tube type: Oral Number of attempts: 1 Placement Confirmation: positive ETCO2 and breath sounds checked- equal and bilateral Tube secured with: Tape Dental Injury: Teeth and Oropharynx as per pre-operative assessment

## 2021-06-19 NOTE — Evaluation (Signed)
Physical Therapy Evaluation Patient Details Name: Eric Ross MRN: [id number] DOB: [birth year] Today's Date: 06/19/2021  History of Present Illness  66 yo male s/p R TKA on 06/19/21. PMH: CRPS, R knee ACL reconstruction, cardiomyopathy, afib, CVA with R hemiparesis, HTN  Clinical Impression  Pt is s/p TKA resulting in the deficits listed below (see PT Problem List).  Pt amb ~ 58' with RW and min assist. Gait deviations noted below, pt has some residual R sided weakness from prior CVA with R knee remaining flexed throughout gait cycle. Pt is motivated to work with PT and anticipate steady progress in acute setting.  Pt will benefit from skilled PT to increase their independence and safety with mobility to allow discharge to the venue listed below.         Recommendations for follow up therapy are one component of a multi-disciplinary discharge planning process, led by the attending physician.  Recommendations may be updated based on patient status, additional functional criteria and insurance authorization.  Follow Up Recommendations Follow physician's recommendations for discharge plan and follow up therapies    Assistance Recommended at Discharge Frequent or constant Supervision/Assistance  Patient can return home with the following  A little help with walking and/or transfers;Assistance with cooking/housework;Assist for transportation;A little help with bathing/dressing/bathroom;Help with stairs or ramp for entrance    Equipment Recommendations Rolling walker (2 wheels)  Recommendations for Other Services       Functional Status Assessment Patient has had a recent decline in their functional status and demonstrates the ability to make significant improvements in function in a reasonable and predictable amount of time.     Precautions / Restrictions Precautions Precautions: Fall;Knee Restrictions Weight Bearing Restrictions: No      Mobility  Bed Mobility Overal bed  mobility: Needs Assistance Bed Mobility: Supine to Sit     Supine to sit: Min guard     General bed mobility comments: for safety    Transfers Overall transfer level: Needs assistance Equipment used: Rolling walker (2 wheels) Transfers: Sit to/from Stand Sit to Stand: Min assist, Min guard           General transfer comment: verbal cues for hand placement. min to min-guard assist to steady on standing    Ambulation/Gait Ambulation/Gait assistance: Min guard, Min assist Gait Distance (Feet): 65 Feet Assistive device: Rolling walker (2 wheels) Gait Pattern/deviations: Step-to pattern, Decreased stance time - right, Decreased weight shift to right, Knee flexed in stance - right, Decreased step length - right       General Gait Details: verbal cues for RW position, safety, step length, knee extension in stance and in wt RLE - as tolerated  Stairs            Wheelchair Mobility    Modified Rankin (Stroke Patients Only)       Balance Overall balance assessment: Needs assistance, History of Falls Sitting-balance support: Feet supported, No upper extremity supported Sitting balance-Leahy Scale: Good     Standing balance support: During functional activity, Reliant on assistive device for balance, Single extremity supported, Bilateral upper extremity supported Standing balance-Leahy Scale: Poor                               Pertinent Vitals/Pain Pain Assessment Pain Assessment: Faces Pain Location: R knee Pain Descriptors / Indicators: Grimacing, Discomfort, Sore Pain Intervention(s): Limited activity within patient's tolerance, Monitored during session, Premedicated before session, Repositioned  Home Living Family/patient expects to be discharged to:: Private residence Living Arrangements: Spouse/significant other Available Help at Discharge: Available 24 hours/day;Family Type of Home: House Home Access: Stairs to enter Entrance  Stairs-Rails: Right Entrance Stairs-Number of Steps: ~4   Home Layout: One level Home Equipment: None      Prior Function Prior Level of Function : Independent/Modified Independent                     Hand Dominance        Extremity/Trunk Assessment   Upper Extremity Assessment Upper Extremity Assessment: RUE deficits/detail RUE Deficits / Details: maintains RUE in flexion pattern, A/AROM grossly WFL; grip WFL    Lower Extremity Assessment Lower Extremity Assessment: RLE deficits/detail RLE Deficits / Details: knee AROM ~ 15 to 60 degrees flexion. knee extension with hip flexion ~ 2+/5       Communication   Communication: Expressive difficulties  Cognition Arousal/Alertness: Awake/alert Behavior During Therapy: WFL for tasks assessed/performed Overall Cognitive Status: Difficult to assess                                 General Comments: follows commands consistently. expressive aphasia        General Comments      Exercises Total Joint Exercises Ankle Circles/Pumps: AROM, Both, 5 reps, Limitations Ankle Circles/Pumps Limitations: limited pf R, pes cavus deformity R foot   Assessment/Plan    PT Assessment Patient needs continued PT services  PT Problem List Decreased strength;Decreased mobility;Decreased range of motion;Decreased activity tolerance;Decreased balance;Decreased knowledge of use of DME;Pain       PT Treatment Interventions DME instruction;Therapeutic exercise;Gait training;Stair training;Functional mobility training;Therapeutic activities;Patient/family education    PT Goals (Current goals can be found in the Care Plan section)  Acute Rehab PT Goals Patient Stated Goal: home soon, walk more PT Goal Formulation: With patient Time For Goal Achievement: 06/26/21 Potential to Achieve Goals: Good    Frequency 7X/week     Co-evaluation               AM-PAC PT "6 Clicks" Mobility  Outcome Measure Help needed  turning from your back to your side while in a flat bed without using bedrails?: A Little Help needed moving from lying on your back to sitting on the side of a flat bed without using bedrails?: A Little Help needed moving to and from a bed to a chair (including a wheelchair)?: A Little Help needed standing up from a chair using your arms (e.g., wheelchair or bedside chair)?: A Little Help needed to walk in hospital room?: A Little Help needed climbing 3-5 steps with a railing? : A Lot 6 Click Score: 17    End of Session Equipment Utilized During Treatment: Gait belt Activity Tolerance: Patient tolerated treatment well Patient left: with call bell/phone within reach;with chair alarm set;with family/visitor present;in chair   PT Visit Diagnosis: Other abnormalities of gait and mobility (R26.89);Difficulty in walking, not elsewhere classified (R26.2)    Time: 3220-2542 PT Time Calculation (min) (ACUTE ONLY): 14 min   Charges:   PT Evaluation $PT Eval Low Complexity: [place], PT  Acute Rehab Dept ([place]) [phone] Pager [phone]  06/19/2021   [facility] 06/19/2021, 4:46 PM

## 2021-06-20 ENCOUNTER — Encounter (HOSPITAL_COMMUNITY): Payer: Self-pay | Admitting: Orthopedic Surgery

## 2021-06-20 DIAGNOSIS — R002 Palpitations: Secondary | ICD-10-CM | POA: Diagnosis not present

## 2021-06-20 DIAGNOSIS — R079 Chest pain, unspecified: Secondary | ICD-10-CM | POA: Diagnosis not present

## 2021-06-20 DIAGNOSIS — R5383 Other fatigue: Secondary | ICD-10-CM | POA: Diagnosis not present

## 2021-06-20 DIAGNOSIS — J3489 Other specified disorders of nose and nasal sinuses: Secondary | ICD-10-CM | POA: Diagnosis not present

## 2021-06-20 DIAGNOSIS — I10 Essential (primary) hypertension: Secondary | ICD-10-CM | POA: Diagnosis not present

## 2021-06-20 DIAGNOSIS — Z85850 Personal history of malignant neoplasm of thyroid: Secondary | ICD-10-CM | POA: Diagnosis not present

## 2021-06-20 DIAGNOSIS — M25761 Osteophyte, right knee: Secondary | ICD-10-CM | POA: Diagnosis not present

## 2021-06-20 DIAGNOSIS — M25561 Pain in right knee: Secondary | ICD-10-CM | POA: Diagnosis not present

## 2021-06-20 DIAGNOSIS — M25461 Effusion, right knee: Secondary | ICD-10-CM | POA: Diagnosis not present

## 2021-06-20 DIAGNOSIS — M238X1 Other internal derangements of right knee: Secondary | ICD-10-CM | POA: Diagnosis not present

## 2021-06-20 DIAGNOSIS — Z96651 Presence of right artificial knee joint: Secondary | ICD-10-CM | POA: Diagnosis not present

## 2021-06-20 DIAGNOSIS — E890 Postprocedural hypothyroidism: Secondary | ICD-10-CM | POA: Diagnosis not present

## 2021-06-20 DIAGNOSIS — Z823 Family history of stroke: Secondary | ICD-10-CM | POA: Diagnosis not present

## 2021-06-20 DIAGNOSIS — R42 Dizziness and giddiness: Secondary | ICD-10-CM | POA: Diagnosis not present

## 2021-06-20 DIAGNOSIS — Z7982 Long term (current) use of aspirin: Secondary | ICD-10-CM | POA: Diagnosis not present

## 2021-06-20 DIAGNOSIS — M11261 Other chondrocalcinosis, right knee: Secondary | ICD-10-CM | POA: Diagnosis not present

## 2021-06-20 DIAGNOSIS — M11262 Other chondrocalcinosis, left knee: Secondary | ICD-10-CM | POA: Diagnosis not present

## 2021-06-20 DIAGNOSIS — M1711 Unilateral primary osteoarthritis, right knee: Secondary | ICD-10-CM | POA: Diagnosis not present

## 2021-06-20 DIAGNOSIS — Z79899 Other long term (current) drug therapy: Secondary | ICD-10-CM | POA: Diagnosis not present

## 2021-06-20 DIAGNOSIS — R69 Illness, unspecified: Secondary | ICD-10-CM | POA: Diagnosis not present

## 2021-06-20 DIAGNOSIS — R197 Diarrhea, unspecified: Secondary | ICD-10-CM | POA: Diagnosis not present

## 2021-06-20 DIAGNOSIS — R519 Headache, unspecified: Secondary | ICD-10-CM | POA: Diagnosis not present

## 2021-06-20 DIAGNOSIS — Z7901 Long term (current) use of anticoagulants: Secondary | ICD-10-CM | POA: Diagnosis not present

## 2021-06-20 DIAGNOSIS — Z96653 Presence of artificial knee joint, bilateral: Secondary | ICD-10-CM | POA: Diagnosis not present

## 2021-06-20 DIAGNOSIS — M791 Myalgia, unspecified site: Secondary | ICD-10-CM | POA: Diagnosis not present

## 2021-06-20 LAB — PROTIME-INR
INR: 1.3 — ABNORMAL HIGH (ref 0.8–1.2)
Prothrombin Time: 16.1 seconds — ABNORMAL HIGH (ref 11.4–15.2)

## 2021-06-20 NOTE — TOC Transition Note (Signed)
Transition of Care [facility]) - CM/SW Discharge Note  Patient Details  Name: Eric Ross MRN: [id number] Date of Birth: [birth year]  [facility] CM/SW Contact:  Sherie Don, LCSW Phone Number: 06/20/2021, 9:42 AM  Clinical Narrative: Patient is expected to discharge home after working with PT. CSW met with patient to review discharge plan. Patient will go home with HHPT, which was prearranged with [place], and then transition to [place] at [facility]. Patient will need a rolling walker, which was delivered to patient's room by [employer]. TOC signing off.  Final next level of care: [place] Barriers to Discharge: No Barriers Identified  Patient Goals and CMS Choice Patient states their goals for this hospitalization and ongoing recovery are:: Discharge home with [place] CMS Medicare.gov Compare Post Acute Care list provided to:: Patient Choice offered to / list presented to : Patient  Discharge Plan and Services        DME Arranged: Walker rolling DME Agency: [employer] Representative spoke with at DME Agency: Prearranged in orthopedist's office HH Arranged: PT [place] Agency: Magazine features editor spoke with at [place]: Prearranged in orthopedist's office  Readmission Risk Interventions     View : No data to display.

## 2021-06-20 NOTE — Progress Notes (Signed)
PATIENT ID: Eric Ross  MRN: [id number]  DOB/AGE:  [birth year] / 66 y.o.  1 Day Post-Op Procedure(s) (LRB): RIGHT TOTAL KNEE ARTHROPLASTY (Right)    PROGRESS NOTE Subjective: Patient is alert, oriented, no Nausea, no Vomiting, yes passing gas. Taking PO well. Denies SOB, Chest or Calf Pain. Using Incentive Spirometer, PAS in place. Ambulate 65', Patient reports pain as 2/10 .    Objective: Vital signs in last 24 hours: Vitals:   06/19/21 2135 06/19/21 2136 06/20/21 0150 06/20/21 0518  BP:  110/80 98/66 109/67  Pulse:  96 83 89  Resp: '17 17 17 17  '$ Temp:  97.8 F (36.6 C) 98.4 F (36.9 C) 98.4 F (36.9 C)  TempSrc:   Oral Oral  SpO2:  95% 94% 97%  Weight:      Height:          Intake/Output from previous day: I/O last 3 completed shifts: In: 2193.8 [P.O.:300; I.V.:1793.8; IV Piggyback:100] Out: 1300 [Urine:1250; Blood:50]   Intake/Output this shift: No intake/output data recorded.   LABORATORY DATA: Recent Labs    06/19/21 0550 06/19/21 1110 06/20/21 0318  WBC  --  5.9  --   HGB  --  11.8*  --   HCT  --  35.5*  --   PLT  --  122*  --   CREATININE  --  0.92  --   INR 1.3*  --  1.3*    Examination: Neurologically intact ABD soft Neurovascular intact Sensation intact distally Intact pulses distally Dorsiflexion/Plantar flexion intact Incision: dressing C/D/I No cellulitis present Compartment soft} Can do supine SLR, ROM 5-95 deg  Assessment:   1 Day Post-Op Procedure(s) (LRB): RIGHT TOTAL KNEE ARTHROPLASTY (Right) ADDITIONAL DIAGNOSIS: Expected Acute Blood Loss Anemia, hx CVA, aphasia, afib  Patient's anticipated LOS is less than 2 midnights, meeting these requirements: - Younger than 12 - Lives within 1 hour of care - Has a competent adult at home to recover with post-op recover - NO history of  - Chronic pain requiring opiods  - Diabetes  - Coronary Artery Disease  - Heart failure  - Heart attack  - Stroke  - DVT/VTE  - Cardiac  arrhythmia  - Respiratory Failure/COPD  - Renal failure  - Anemia  - Advanced Liver disease     Plan: PT/OT WBAT, AROM and PROM  DVT Prophylaxis:  SCDx72hrs, lovenox to coumadin bridge DISCHARGE PLAN: Home, today if passes PT DISCHARGE NEEDS: HHPT, Walker, and 3-in-1 comode seat     Eric Ross 06/20/2021, 7:59 AM  Patient ID: Eric Ross, male   DOB: Jun 01, 1955, 66 y.o.   MRN: [id number]

## 2021-06-20 NOTE — Progress Notes (Signed)
Physical Therapy Treatment Patient Details Name: Eric Ross MRN: [id number] DOB: [birth year] Today's Date: 06/20/2021   History of Present Illness 66 yo male s/p R TKA on 06/19/21. PMH: CRPS, R knee ACL reconstruction, cardiomyopathy, afib, CVA with R hemiparesis, HTN    PT Comments    Pt is progressing well. Having some pain after PT, will see again this pm and pt should be ready to d/c if safe with stair training.    Recommendations for follow up therapy are one component of a multi-disciplinary discharge planning process, led by the attending physician.  Recommendations may be updated based on patient status, additional functional criteria and insurance authorization.  Follow Up Recommendations  Follow physician's recommendations for discharge plan and follow up therapies     Assistance Recommended at Discharge Frequent or constant Supervision/Assistance  Patient can return home with the following A little help with walking and/or transfers;Assistance with cooking/housework;Assist for transportation;A little help with bathing/dressing/bathroom;Help with stairs or ramp for entrance   Equipment Recommendations  Rolling walker (2 wheels)    Recommendations for Other Services       Precautions / Restrictions Precautions Precautions: Fall;Knee Restrictions RLE Weight Bearing: Weight bearing as tolerated     Mobility  Bed Mobility Overal bed mobility: Needs Assistance Bed Mobility: Supine to Sit     Supine to sit: Supervision     General bed mobility comments: for safety    Transfers Overall transfer level: Needs assistance Equipment used: Rolling walker (2 wheels) Transfers: Sit to/from Stand Sit to Stand: Min guard           General transfer comment: verbal cues for hand placement. min to min-guard assist to steady on standing    Ambulation/Gait Ambulation/Gait assistance: Min guard Gait Distance (Feet): 140 Feet Assistive device: Rolling walker (2  wheels) Gait Pattern/deviations: Step-to pattern, Decreased stance time - right, Decreased weight shift to right, Knee flexed in stance - right, Decreased step length - right       General Gait Details: verbal cues for RW position, safety, step length, knee extension in stance and in wt RLE - as tolerated   Stairs             Wheelchair Mobility    Modified Rankin (Stroke Patients Only)       Balance                                            Cognition Arousal/Alertness: Awake/alert Behavior During Therapy: WFL for tasks assessed/performed Overall Cognitive Status: Difficult to assess                                 General Comments: follows commands consistently. expressive aphasia        Exercises Total Joint Exercises Ankle Circles/Pumps: AROM, Both, 10 reps Ankle Circles/Pumps Limitations: limited pf R, pes cavus deformity R foot Quad Sets: AROM, Strengthening, Both, 10 reps Heel Slides: AROM, AAROM, Right, 10 reps Hip ABduction/ADduction: AROM, Right, 10 reps Straight Leg Raises: AROM, Right, 10 reps Long Arc Quad: AROM, Right, Seated, 10 reps Knee Flexion: AAROM, Right, 5 reps, Seated Goniometric ROM: grossly 15 to 95 degrees right knee flexion    General Comments        Pertinent Vitals/Pain Pain Assessment Pain Assessment: Faces Faces Pain Scale: Hurts even more  Pain Location: R knee Pain Descriptors / Indicators: Grimacing, Discomfort, Sore Pain Intervention(s): Limited activity within patient's tolerance, Monitored during session, Premedicated before session, Repositioned, Ice applied    Home Living                          Prior Function            PT Goals (current goals can now be found in the care plan section) Acute Rehab PT Goals Patient Stated Goal: home soon, walk more PT Goal Formulation: With patient Time For Goal Achievement: 06/26/21 Potential to Achieve Goals: Good Progress  towards PT goals: Progressing toward goals    Frequency    7X/week      PT Plan Current plan remains appropriate    Co-evaluation              AM-PAC PT "6 Clicks" Mobility   Outcome Measure  Help needed turning from your back to your side while in a flat bed without using bedrails?: A Little Help needed moving from lying on your back to sitting on the side of a flat bed without using bedrails?: A Little Help needed moving to and from a bed to a chair (including a wheelchair)?: A Little Help needed standing up from a chair using your arms (e.g., wheelchair or bedside chair)?: A Little Help needed to walk in hospital room?: A Little Help needed climbing 3-5 steps with a railing? : A Little 6 Click Score: 18    End of Session Equipment Utilized During Treatment: Gait belt Activity Tolerance: Patient tolerated treatment well Patient left: with call bell/phone within reach;with chair alarm set;with family/visitor present;in chair Nurse Communication: Mobility status PT Visit Diagnosis: Other abnormalities of gait and mobility (R26.89);Difficulty in walking, not elsewhere classified (R26.2)     Time: 7829-5621 PT Time Calculation (min) (ACUTE ONLY): 15 min  Charges:  $Gait Training: 8-22 mins                     Baxter Flattery, PT  Acute Rehab Dept ([place]) [phone] Pager [phone]  06/20/2021    [facility] 06/20/2021, 10:03 AM

## 2021-06-20 NOTE — Progress Notes (Signed)
06/20/21 1200  PT Visit Information  Last PT Received On 06/20/21 Reviewed stairs, amb, transfer safety with pt and family . Reviewed importance of terminal knee extension, use of RW for safety until HHPT advises otherwise. Pt and family member feel ready to d/c. Family able to assist as needed, therefore pt is ready to d/c form PT standpoint.  Assistance Needed +1  History of Present Illness 66 yo male s/p R TKA on 06/19/21. PMH: CRPS, R knee ACL reconstruction, cardiomyopathy, afib, CVA with R hemiparesis, HTN  Subjective Data  Patient Stated Goal home soon, walk more  Precautions  Precautions Fall;Knee  Restrictions  Weight Bearing Restrictions No  RLE Weight Bearing WBAT  Pain Assessment  Pain Assessment Faces  Faces Pain Scale 6  Pain Location R knee  Pain Descriptors / Indicators Grimacing;Discomfort;Sore  Pain Intervention(s) Limited activity within patient's tolerance;Monitored during session;Premedicated before session;Repositioned  Cognition  Arousal/Alertness Awake/alert  Behavior During Therapy WFL for tasks assessed/performed  Overall Cognitive Status Difficult to assess  General Comments follows commands consistently. expressive aphasia  Transfers  Overall transfer level Needs assistance  Equipment used Rolling walker (2 wheels)  Transfers Sit to/from Stand  Sit to Stand Min guard  General transfer comment verbal cues for hand placement. min-guard assist for safety on standing. pt stands without walker, no LOB however cautioned to not stand without RW at  home  Ambulation/Gait  Ambulation/Gait assistance Min guard  Gait Distance (Feet) 90 Feet  Assistive device Rolling walker (2 wheels)  Gait Pattern/deviations Step-to pattern;Decreased stance time - right;Decreased weight shift to right;Knee flexed in stance - right;Decreased step length - right  General Gait Details verbal cues for RW position, safety, step length, knee extension in stance and in wt RLE - as  tolerated  Stairs Yes  Stairs assistance Min guard  Stair Management One rail Right;One rail Left;Step to pattern  Number of Stairs 3  General stair comments cues for technique, sequence and safety. family member present for session, able to assist as needed and able to cue "up with good, down with bad"  PT - End of Session  Equipment Utilized During Treatment Gait belt  Activity Tolerance Patient tolerated treatment well  Patient left with call bell/phone within reach;with chair alarm set;with family/visitor present;in chair  Nurse Communication Mobility status   PT - Assessment/Plan  PT Plan Current plan remains appropriate  PT Visit Diagnosis Other abnormalities of gait and mobility (R26.89);Difficulty in walking, not elsewhere classified (R26.2)  PT Frequency (ACUTE ONLY) 7X/week  Follow Up Recommendations Follow physician's recommendations for discharge plan and follow up therapies  Assistance recommended at discharge Frequent or constant Supervision/Assistance  Patient can return home with the following A little help with walking and/or transfers;Assistance with cooking/housework;Assist for transportation;A little help with bathing/dressing/bathroom;Help with stairs or ramp for entrance  PT equipment Rolling walker (2 wheels)  AM-PAC PT "6 Clicks" Mobility Outcome Measure (Version 2)  Help needed turning from your back to your side while in a flat bed without using bedrails? 3  Help needed moving from lying on your back to sitting on the side of a flat bed without using bedrails? 3  Help needed moving to and from a bed to a chair (including a wheelchair)? 3  Help needed standing up from a chair using your arms (e.g., wheelchair or bedside chair)? 3  Help needed to walk in hospital room? 3  Help needed climbing 3-5 steps with a railing?  3  6 Click Score 18  Consider Recommendation of Discharge To: Home with [facility]  Progressive Mobility  What is the highest level of mobility based on  the progressive mobility assessment? Level 5 (Walks with assist in room/hall) - Balance while stepping forward/back and can walk in room with assist - Complete  Activity Ambulated with assistance in hallway  PT Goal Progression  Progress towards PT goals Progressing toward goals  Acute Rehab PT Goals  PT Goal Formulation With patient  Time For Goal Achievement 06/26/21  Potential to Achieve Goals Good  PT Time Calculation  PT Start Time (ACUTE ONLY) 1108  PT Stop Time (ACUTE ONLY) 1130  PT Time Calculation (min) (ACUTE ONLY) 22 min  PT General Charges  $$ ACUTE PT VISIT 1 Visit  PT Treatments  $Gait Training 8-22 mins

## 2021-06-20 NOTE — Discharge Summary (Signed)
Patient ID: Eric Ross MRN: [id number] DOB/AGE: Jul 19, 1955 66 y.o.  Admit date: 06/19/2021 Discharge date: 06/20/2021  Admission Diagnoses:  Principal Problem:   Osteoarthritis of right knee Active Problems:   S/P TKR (total knee replacement) using cement, right   Discharge Diagnoses:  Same  Past Medical History:  Diagnosis Date   Atrial fibrillation ([place])    Dr. Gwenlyn Found   Cardiomyopathy    nonischemic   Dyspnea    Erectile dysfunction    GERD (gastroesophageal reflux disease)    Headache    History of kidney stones    History of thyroid cancer    Hyperlipidemia    Hypothyroidism    Pseudogout    Stroke ([place]) 01/29/2001   Thyroid cancer ([place])     Surgeries: Procedure(s): RIGHT TOTAL KNEE ARTHROPLASTY on 06/19/2021   Consultants:   Discharged Condition: Improved  Hospital Course: [name] is an 66 y.o. male who was admitted 06/19/2021 for operative treatment ofOsteoarthritis of right knee. Patient has severe unremitting pain that affects sleep, daily activities, and work/hobbies. After pre-op clearance the patient was taken to the operating room on 06/19/2021 and underwent  Procedure(s): RIGHT TOTAL KNEE ARTHROPLASTY.    Patient was given perioperative antibiotics:  Anti-infectives (From admission, onward)    Start     Dose/Rate Route Frequency Ordered Stop   06/19/21 0600  vancomycin (VANCOCIN) IVPB 1000 mg/200 mL premix        1,000 mg 200 mL/hr over 60 Minutes Intravenous On call to O.R. 06/19/21 0525 06/19/21 1104        Patient was given sequential compression devices, early ambulation, and chemoprophylaxis to prevent DVT.  Patient benefited maximally from hospital stay and there were no complications.    Recent vital signs: Patient Vitals for the past 24 hrs:  BP Temp Temp src Pulse Resp SpO2  06/20/21 0518 109/67 98.4 F (36.9 C) Oral 89 17 97 %  06/20/21 0150 98/66 98.4 F (36.9 C) Oral 83 17 94 %  06/19/21 2136 110/80 97.8 F (36.6  C) -- 96 17 95 %  06/19/21 2135 -- -- -- -- 17 --  06/19/21 1859 (!) 108/59 98.3 F (36.8 C) Oral 82 18 97 %  06/19/21 1256 111/77 (!) 97.5 F (36.4 C) Oral 80 16 96 %  06/19/21 1032 119/85 97.6 F (36.4 C) -- 78 15 99 %  06/19/21 1015 108/75 98.4 F (36.9 C) -- 67 11 96 %  06/19/21 1000 127/79 -- -- 89 17 96 %  06/19/21 0945 115/86 -- -- 85 18 94 %  06/19/21 0930 124/78 -- -- 87 13 100 %  06/19/21 0923 118/80 98.5 F (36.9 C) -- 81 15 100 %     Recent laboratory studies:  Recent Labs    06/19/21 0550 06/19/21 1110 06/20/21 0318  WBC  --  5.9  --   HGB  --  11.8*  --   HCT  --  35.5*  --   PLT  --  122*  --   CREATININE  --  0.92  --   INR 1.3*  --  1.3*     Discharge Medications:   Allergies as of 06/20/2021       Reactions   Penicillins    REACTION: Paralyzed him @ 66 yrs old        Medication List     STOP taking these medications    HYDROcodone-acetaminophen 10-325 MG tablet Commonly known as: NORCO  TAKE these medications    acetaminophen 500 MG tablet Commonly known as: TYLENOL Take 500-1,000 mg by mouth every 6 (six) hours as needed (headaches.).   BC Fast Pain Relief 650-195-33.3 MG Pack Generic drug: Aspirin-Salicylamide-Caffeine Take 1 packet by mouth daily as needed (pain.).   carvedilol 25 MG tablet Commonly known as: COREG Take 1.5 tablets (37.5 mg total) by mouth 2 (two) times daily.   cetirizine 10 MG tablet Commonly known as: ZYRTEC Take 10 mg by mouth daily as needed for allergies.   diclofenac Sodium 1 % Gel Commonly known as: VOLTAREN Apply 4 g topically 2 (two) times daily as needed (pain.).   enoxaparin 80 MG/0.8ML injection Commonly known as: LOVENOX Inject 0.8 mLs (80 mg total) into the skin every 12 (twelve) hours.   levothyroxine 112 MCG tablet Commonly known as: SYNTHROID Take 1 tablet (112 mcg total) by mouth daily.   oxyCODONE-acetaminophen 5-325 MG tablet Commonly known as: PERCOCET/ROXICET Take 1  tablet by mouth every 4 (four) hours as needed for severe pain.   pantoprazole 40 MG tablet Commonly known as: PROTONIX Take 1 tablet (40 mg total) by mouth daily.   ramipril 2.5 MG capsule Commonly known as: ALTACE Take 1 capsule (2.5 mg total) by mouth daily.   simvastatin 20 MG tablet Commonly known as: ZOCOR Take 1 tablet (20 mg total) by mouth daily. What changed: when to take this   tadalafil 20 MG tablet Commonly known as: Cialis Take 1 tablet (20 mg total) by mouth daily as needed for erectile dysfunction.   tiZANidine 2 MG tablet Commonly known as: ZANAFLEX Take 1 tablet (2 mg total) by mouth every 6 (six) hours as needed.   warfarin 5 MG tablet Commonly known as: COUMADIN Take as directed. If you are unsure how to take this medication, talk to your nurse or doctor. Original instructions: TAKE 1/2 TO 1 TABLET BY MOUTH DAILY AS DIRECTED BY COUMADIN CLINIC What changed: See the new instructions.               Durable Medical Equipment  (From admission, onward)           Start     Ordered   06/19/21 1043  DME Walker rolling  Once       Question:  Patient needs a walker to treat with the following condition  Answer:  Status post right hip replacement   06/19/21 1042   06/19/21 1043  DME 3 n 1  Once        06/19/21 1042              Discharge Care Instructions  (From admission, onward)           Start     Ordered   06/20/21 0000  Change dressing       Comments: Change dressing Only if drainage exceeds 40% of window on dressing   06/20/21 0804            Diagnostic Studies: DG Chest 2 View  Result Date: 06/08/2021 CLINICAL DATA:  Preoperative evaluation. EXAM: CHEST - 2 VIEW COMPARISON:  Chest radiograph 11/14/2008 FINDINGS: The heart size and mediastinal contours are within normal limits. Both lungs are clear. The visualized skeletal structures are unremarkable. IMPRESSION: No active cardiopulmonary disease. Electronically Signed   By:  Lovey Newcomer M.D.   On: 06/08/2021 14:35    Disposition: Discharge disposition: 01-Home or Self Care       Discharge Instructions     Call  MD / Call 911   Complete by: As directed    If you experience chest pain or shortness of breath, CALL 911 and be transported to the hospital emergency room.  If you develope a fever above 101 F, pus (white drainage) or increased drainage or redness at the wound, or calf pain, call your surgeon's office.   Change dressing   Complete by: As directed    Change dressing Only if drainage exceeds 40% of window on dressing   Constipation Prevention   Complete by: As directed    Drink plenty of fluids.  Prune juice may be helpful.  You may use a stool softener, such as Colace (over the counter) 100 mg twice a day.  Use MiraLax (over the counter) for constipation as needed.   Diet - low sodium heart healthy   Complete by: As directed    Increase activity slowly as tolerated   Complete by: As directed    Post-operative opioid taper instructions:   Complete by: As directed    POST-OPERATIVE OPIOID TAPER INSTRUCTIONS: It is important to wean off of your opioid medication as soon as possible. If you do not need pain medication after your surgery it is ok to stop day one. Opioids include: Codeine, Hydrocodone(Norco, Vicodin), Oxycodone(Percocet, oxycontin) and hydromorphone amongst others.  Long term and even short term use of opiods can cause: Increased pain response Dependence Constipation Depression Respiratory depression And more.  Withdrawal symptoms can include Flu like symptoms Nausea, vomiting And more Techniques to manage these symptoms Hydrate well Eat regular healthy meals Stay active Use relaxation techniques(deep breathing, meditating, yoga) Do Not substitute Alcohol to help with tapering If you have been on opioids for less than two weeks and do not have pain than it is ok to stop all together.  Plan to wean off of opioids This plan  should start within one week post op of your joint replacement. Maintain the same interval or time between taking each dose and first decrease the dose.  Cut the total daily intake of opioids by one tablet each day Next start to increase the time between doses. The last dose that should be eliminated is the evening dose.           Follow-up Information     Frederik Pear, MD. Go on 06/29/2021.   Specialty: Orthopedic Surgery Why: Your appointment is scheduled for 9:30 Contact information: [place] [address] [phone]         [facility] [place] Follow up.   Specialty: Home Health Services Why: HHPT will provide 6 home visits prior to starting OPPT Contact information: [address] [place] [address] [phone]         [place]. Go on 06/30/2021.   Why: Your outpatient physical therapy appointment is scheduled for 10:15. Contact information: [phone]                 Signed: [name] 06/20/2021, 8:05 AM

## 2021-06-22 DIAGNOSIS — E039 Hypothyroidism, unspecified: Secondary | ICD-10-CM | POA: Diagnosis not present

## 2021-06-22 DIAGNOSIS — G894 Chronic pain syndrome: Secondary | ICD-10-CM | POA: Diagnosis not present

## 2021-06-22 DIAGNOSIS — G90519 Complex regional pain syndrome I of unspecified upper limb: Secondary | ICD-10-CM | POA: Diagnosis not present

## 2021-06-22 DIAGNOSIS — Z471 Aftercare following joint replacement surgery: Secondary | ICD-10-CM | POA: Diagnosis not present

## 2021-06-22 DIAGNOSIS — Z96651 Presence of right artificial knee joint: Secondary | ICD-10-CM | POA: Diagnosis not present

## 2021-06-22 DIAGNOSIS — I10 Essential (primary) hypertension: Secondary | ICD-10-CM | POA: Diagnosis not present

## 2021-06-22 DIAGNOSIS — I428 Other cardiomyopathies: Secondary | ICD-10-CM | POA: Diagnosis not present

## 2021-06-22 DIAGNOSIS — N529 Male erectile dysfunction, unspecified: Secondary | ICD-10-CM | POA: Diagnosis not present

## 2021-06-22 DIAGNOSIS — K219 Gastro-esophageal reflux disease without esophagitis: Secondary | ICD-10-CM | POA: Diagnosis not present

## 2021-06-22 DIAGNOSIS — Z87442 Personal history of urinary calculi: Secondary | ICD-10-CM | POA: Diagnosis not present

## 2021-06-22 DIAGNOSIS — I4891 Unspecified atrial fibrillation: Secondary | ICD-10-CM | POA: Diagnosis not present

## 2021-06-22 DIAGNOSIS — M47817 Spondylosis without myelopathy or radiculopathy, lumbosacral region: Secondary | ICD-10-CM | POA: Diagnosis not present

## 2021-06-22 DIAGNOSIS — I69351 Hemiplegia and hemiparesis following cerebral infarction affecting right dominant side: Secondary | ICD-10-CM | POA: Diagnosis not present

## 2021-06-22 DIAGNOSIS — E782 Mixed hyperlipidemia: Secondary | ICD-10-CM | POA: Diagnosis not present

## 2021-06-22 DIAGNOSIS — Z85850 Personal history of malignant neoplasm of thyroid: Secondary | ICD-10-CM | POA: Diagnosis not present

## 2021-06-22 DIAGNOSIS — Z7901 Long term (current) use of anticoagulants: Secondary | ICD-10-CM | POA: Diagnosis not present

## 2021-06-22 DIAGNOSIS — M5136 Other intervertebral disc degeneration, lumbar region: Secondary | ICD-10-CM | POA: Diagnosis not present

## 2021-06-24 DIAGNOSIS — Z471 Aftercare following joint replacement surgery: Secondary | ICD-10-CM | POA: Diagnosis not present

## 2021-06-24 DIAGNOSIS — I69351 Hemiplegia and hemiparesis following cerebral infarction affecting right dominant side: Secondary | ICD-10-CM | POA: Diagnosis not present

## 2021-06-24 DIAGNOSIS — Z85850 Personal history of malignant neoplasm of thyroid: Secondary | ICD-10-CM | POA: Diagnosis not present

## 2021-06-24 DIAGNOSIS — E782 Mixed hyperlipidemia: Secondary | ICD-10-CM | POA: Diagnosis not present

## 2021-06-24 DIAGNOSIS — K219 Gastro-esophageal reflux disease without esophagitis: Secondary | ICD-10-CM | POA: Diagnosis not present

## 2021-06-24 DIAGNOSIS — I10 Essential (primary) hypertension: Secondary | ICD-10-CM | POA: Diagnosis not present

## 2021-06-24 DIAGNOSIS — I4891 Unspecified atrial fibrillation: Secondary | ICD-10-CM | POA: Diagnosis not present

## 2021-06-24 DIAGNOSIS — M5136 Other intervertebral disc degeneration, lumbar region: Secondary | ICD-10-CM | POA: Diagnosis not present

## 2021-06-24 DIAGNOSIS — E039 Hypothyroidism, unspecified: Secondary | ICD-10-CM | POA: Diagnosis not present

## 2021-06-24 DIAGNOSIS — G894 Chronic pain syndrome: Secondary | ICD-10-CM | POA: Diagnosis not present

## 2021-06-24 DIAGNOSIS — N529 Male erectile dysfunction, unspecified: Secondary | ICD-10-CM | POA: Diagnosis not present

## 2021-06-24 DIAGNOSIS — M47817 Spondylosis without myelopathy or radiculopathy, lumbosacral region: Secondary | ICD-10-CM | POA: Diagnosis not present

## 2021-06-24 DIAGNOSIS — Z96651 Presence of right artificial knee joint: Secondary | ICD-10-CM | POA: Diagnosis not present

## 2021-06-24 DIAGNOSIS — Z7901 Long term (current) use of anticoagulants: Secondary | ICD-10-CM | POA: Diagnosis not present

## 2021-06-24 DIAGNOSIS — Z87442 Personal history of urinary calculi: Secondary | ICD-10-CM | POA: Diagnosis not present

## 2021-06-24 DIAGNOSIS — G90519 Complex regional pain syndrome I of unspecified upper limb: Secondary | ICD-10-CM | POA: Diagnosis not present

## 2021-06-24 DIAGNOSIS — I428 Other cardiomyopathies: Secondary | ICD-10-CM | POA: Diagnosis not present

## 2021-06-26 DIAGNOSIS — M47817 Spondylosis without myelopathy or radiculopathy, lumbosacral region: Secondary | ICD-10-CM | POA: Diagnosis not present

## 2021-06-26 DIAGNOSIS — Z471 Aftercare following joint replacement surgery: Secondary | ICD-10-CM | POA: Diagnosis not present

## 2021-06-26 DIAGNOSIS — E782 Mixed hyperlipidemia: Secondary | ICD-10-CM | POA: Diagnosis not present

## 2021-06-26 DIAGNOSIS — I10 Essential (primary) hypertension: Secondary | ICD-10-CM | POA: Diagnosis not present

## 2021-06-26 DIAGNOSIS — G90519 Complex regional pain syndrome I of unspecified upper limb: Secondary | ICD-10-CM | POA: Diagnosis not present

## 2021-06-26 DIAGNOSIS — M5136 Other intervertebral disc degeneration, lumbar region: Secondary | ICD-10-CM | POA: Diagnosis not present

## 2021-06-26 DIAGNOSIS — G894 Chronic pain syndrome: Secondary | ICD-10-CM | POA: Diagnosis not present

## 2021-06-26 DIAGNOSIS — I428 Other cardiomyopathies: Secondary | ICD-10-CM | POA: Diagnosis not present

## 2021-06-26 DIAGNOSIS — I4891 Unspecified atrial fibrillation: Secondary | ICD-10-CM | POA: Diagnosis not present

## 2021-06-26 DIAGNOSIS — Z87442 Personal history of urinary calculi: Secondary | ICD-10-CM | POA: Diagnosis not present

## 2021-06-26 DIAGNOSIS — K219 Gastro-esophageal reflux disease without esophagitis: Secondary | ICD-10-CM | POA: Diagnosis not present

## 2021-06-26 DIAGNOSIS — Z96651 Presence of right artificial knee joint: Secondary | ICD-10-CM | POA: Diagnosis not present

## 2021-06-26 DIAGNOSIS — N529 Male erectile dysfunction, unspecified: Secondary | ICD-10-CM | POA: Diagnosis not present

## 2021-06-26 DIAGNOSIS — Z7901 Long term (current) use of anticoagulants: Secondary | ICD-10-CM | POA: Diagnosis not present

## 2021-06-26 DIAGNOSIS — Z85850 Personal history of malignant neoplasm of thyroid: Secondary | ICD-10-CM | POA: Diagnosis not present

## 2021-06-26 DIAGNOSIS — E039 Hypothyroidism, unspecified: Secondary | ICD-10-CM | POA: Diagnosis not present

## 2021-06-26 DIAGNOSIS — I69351 Hemiplegia and hemiparesis following cerebral infarction affecting right dominant side: Secondary | ICD-10-CM | POA: Diagnosis not present

## 2021-06-27 DIAGNOSIS — G894 Chronic pain syndrome: Secondary | ICD-10-CM | POA: Diagnosis not present

## 2021-06-27 DIAGNOSIS — Z87442 Personal history of urinary calculi: Secondary | ICD-10-CM | POA: Diagnosis not present

## 2021-06-27 DIAGNOSIS — I428 Other cardiomyopathies: Secondary | ICD-10-CM | POA: Diagnosis not present

## 2021-06-27 DIAGNOSIS — I10 Essential (primary) hypertension: Secondary | ICD-10-CM | POA: Diagnosis not present

## 2021-06-27 DIAGNOSIS — Z471 Aftercare following joint replacement surgery: Secondary | ICD-10-CM | POA: Diagnosis not present

## 2021-06-27 DIAGNOSIS — M5136 Other intervertebral disc degeneration, lumbar region: Secondary | ICD-10-CM | POA: Diagnosis not present

## 2021-06-27 DIAGNOSIS — G90519 Complex regional pain syndrome I of unspecified upper limb: Secondary | ICD-10-CM | POA: Diagnosis not present

## 2021-06-27 DIAGNOSIS — Z7901 Long term (current) use of anticoagulants: Secondary | ICD-10-CM | POA: Diagnosis not present

## 2021-06-27 DIAGNOSIS — E039 Hypothyroidism, unspecified: Secondary | ICD-10-CM | POA: Diagnosis not present

## 2021-06-27 DIAGNOSIS — E782 Mixed hyperlipidemia: Secondary | ICD-10-CM | POA: Diagnosis not present

## 2021-06-27 DIAGNOSIS — N529 Male erectile dysfunction, unspecified: Secondary | ICD-10-CM | POA: Diagnosis not present

## 2021-06-27 DIAGNOSIS — I69351 Hemiplegia and hemiparesis following cerebral infarction affecting right dominant side: Secondary | ICD-10-CM | POA: Diagnosis not present

## 2021-06-27 DIAGNOSIS — I4891 Unspecified atrial fibrillation: Secondary | ICD-10-CM | POA: Diagnosis not present

## 2021-06-27 DIAGNOSIS — Z85850 Personal history of malignant neoplasm of thyroid: Secondary | ICD-10-CM | POA: Diagnosis not present

## 2021-06-27 DIAGNOSIS — M47817 Spondylosis without myelopathy or radiculopathy, lumbosacral region: Secondary | ICD-10-CM | POA: Diagnosis not present

## 2021-06-27 DIAGNOSIS — K219 Gastro-esophageal reflux disease without esophagitis: Secondary | ICD-10-CM | POA: Diagnosis not present

## 2021-06-27 DIAGNOSIS — Z96651 Presence of right artificial knee joint: Secondary | ICD-10-CM | POA: Diagnosis not present

## 2021-06-28 DIAGNOSIS — M47817 Spondylosis without myelopathy or radiculopathy, lumbosacral region: Secondary | ICD-10-CM | POA: Diagnosis not present

## 2021-06-28 DIAGNOSIS — I4891 Unspecified atrial fibrillation: Secondary | ICD-10-CM | POA: Diagnosis not present

## 2021-06-28 DIAGNOSIS — I69351 Hemiplegia and hemiparesis following cerebral infarction affecting right dominant side: Secondary | ICD-10-CM | POA: Diagnosis not present

## 2021-06-28 DIAGNOSIS — I428 Other cardiomyopathies: Secondary | ICD-10-CM | POA: Diagnosis not present

## 2021-06-28 DIAGNOSIS — E782 Mixed hyperlipidemia: Secondary | ICD-10-CM | POA: Diagnosis not present

## 2021-06-28 DIAGNOSIS — Z85850 Personal history of malignant neoplasm of thyroid: Secondary | ICD-10-CM | POA: Diagnosis not present

## 2021-06-28 DIAGNOSIS — Z96651 Presence of right artificial knee joint: Secondary | ICD-10-CM | POA: Diagnosis not present

## 2021-06-28 DIAGNOSIS — G894 Chronic pain syndrome: Secondary | ICD-10-CM | POA: Diagnosis not present

## 2021-06-28 DIAGNOSIS — Z87442 Personal history of urinary calculi: Secondary | ICD-10-CM | POA: Diagnosis not present

## 2021-06-28 DIAGNOSIS — Z471 Aftercare following joint replacement surgery: Secondary | ICD-10-CM | POA: Diagnosis not present

## 2021-06-28 DIAGNOSIS — Z7901 Long term (current) use of anticoagulants: Secondary | ICD-10-CM | POA: Diagnosis not present

## 2021-06-28 DIAGNOSIS — M5136 Other intervertebral disc degeneration, lumbar region: Secondary | ICD-10-CM | POA: Diagnosis not present

## 2021-06-28 DIAGNOSIS — K219 Gastro-esophageal reflux disease without esophagitis: Secondary | ICD-10-CM | POA: Diagnosis not present

## 2021-06-28 DIAGNOSIS — I10 Essential (primary) hypertension: Secondary | ICD-10-CM | POA: Diagnosis not present

## 2021-06-28 DIAGNOSIS — N529 Male erectile dysfunction, unspecified: Secondary | ICD-10-CM | POA: Diagnosis not present

## 2021-06-28 DIAGNOSIS — G90519 Complex regional pain syndrome I of unspecified upper limb: Secondary | ICD-10-CM | POA: Diagnosis not present

## 2021-06-28 DIAGNOSIS — E039 Hypothyroidism, unspecified: Secondary | ICD-10-CM | POA: Diagnosis not present

## 2021-06-29 ENCOUNTER — Other Ambulatory Visit: Payer: Self-pay

## 2021-06-29 DIAGNOSIS — E782 Mixed hyperlipidemia: Secondary | ICD-10-CM | POA: Diagnosis not present

## 2021-06-29 DIAGNOSIS — G894 Chronic pain syndrome: Secondary | ICD-10-CM | POA: Diagnosis not present

## 2021-06-29 DIAGNOSIS — I10 Essential (primary) hypertension: Secondary | ICD-10-CM | POA: Diagnosis not present

## 2021-06-29 DIAGNOSIS — I4891 Unspecified atrial fibrillation: Secondary | ICD-10-CM | POA: Diagnosis not present

## 2021-06-29 DIAGNOSIS — M5136 Other intervertebral disc degeneration, lumbar region: Secondary | ICD-10-CM | POA: Diagnosis not present

## 2021-06-29 DIAGNOSIS — Z471 Aftercare following joint replacement surgery: Secondary | ICD-10-CM | POA: Diagnosis not present

## 2021-06-29 DIAGNOSIS — Z7901 Long term (current) use of anticoagulants: Secondary | ICD-10-CM | POA: Diagnosis not present

## 2021-06-29 DIAGNOSIS — M47817 Spondylosis without myelopathy or radiculopathy, lumbosacral region: Secondary | ICD-10-CM | POA: Diagnosis not present

## 2021-06-29 DIAGNOSIS — E039 Hypothyroidism, unspecified: Secondary | ICD-10-CM | POA: Diagnosis not present

## 2021-06-29 DIAGNOSIS — I69351 Hemiplegia and hemiparesis following cerebral infarction affecting right dominant side: Secondary | ICD-10-CM | POA: Diagnosis not present

## 2021-06-29 DIAGNOSIS — K219 Gastro-esophageal reflux disease without esophagitis: Secondary | ICD-10-CM | POA: Diagnosis not present

## 2021-06-29 DIAGNOSIS — I428 Other cardiomyopathies: Secondary | ICD-10-CM | POA: Diagnosis not present

## 2021-06-29 DIAGNOSIS — Z85850 Personal history of malignant neoplasm of thyroid: Secondary | ICD-10-CM | POA: Diagnosis not present

## 2021-06-29 DIAGNOSIS — Z87442 Personal history of urinary calculi: Secondary | ICD-10-CM | POA: Diagnosis not present

## 2021-06-29 DIAGNOSIS — G90519 Complex regional pain syndrome I of unspecified upper limb: Secondary | ICD-10-CM | POA: Diagnosis not present

## 2021-06-29 DIAGNOSIS — N529 Male erectile dysfunction, unspecified: Secondary | ICD-10-CM | POA: Diagnosis not present

## 2021-06-29 DIAGNOSIS — Z96651 Presence of right artificial knee joint: Secondary | ICD-10-CM | POA: Diagnosis not present

## 2021-06-30 ENCOUNTER — Ambulatory Visit: Payer: Medicare HMO | Attending: Orthopedic Surgery | Admitting: Physical Therapy

## 2021-06-30 ENCOUNTER — Ambulatory Visit (INDEPENDENT_AMBULATORY_CARE_PROVIDER_SITE_OTHER): Payer: Medicare HMO | Admitting: Cardiology

## 2021-06-30 VITALS — BP 92/64

## 2021-06-30 DIAGNOSIS — M25561 Pain in right knee: Secondary | ICD-10-CM | POA: Diagnosis present

## 2021-06-30 DIAGNOSIS — Z8673 Personal history of transient ischemic attack (TIA), and cerebral infarction without residual deficits: Secondary | ICD-10-CM

## 2021-06-30 DIAGNOSIS — M25661 Stiffness of right knee, not elsewhere classified: Secondary | ICD-10-CM | POA: Insufficient documentation

## 2021-06-30 DIAGNOSIS — Z7901 Long term (current) use of anticoagulants: Secondary | ICD-10-CM | POA: Diagnosis not present

## 2021-06-30 DIAGNOSIS — M6281 Muscle weakness (generalized): Secondary | ICD-10-CM | POA: Diagnosis present

## 2021-06-30 LAB — PROTIME-INR
INR: 1.7 — ABNORMAL HIGH (ref 0.9–1.2)
Prothrombin Time: 17.7 s — ABNORMAL HIGH (ref 9.1–12.0)

## 2021-06-30 NOTE — Therapy (Signed)
[place], [place], [address] Phone: [phone]   Fax:  [phone]  Physical Therapy Evaluation  Patient Details  Name: Eric Ross MRN: [id number] Date of Birth: 01-Oct-1955 Referring Provider (PT): Frederik Pear, MD   Encounter Date: 06/30/2021   PT End of Session - 06/30/21 1309     Visit Number 1    Number of Visits 12    Date for PT Re-Evaluation 08/11/21    Authorization Type Aetna Medicare    PT Start Time 0254    PT Stop Time 1055    PT Time Calculation (min) 40 min    Activity Tolerance Patient tolerated treatment well    Behavior During Therapy [facility] for tasks assessed/performed             Past Medical History:  Diagnosis Date   Atrial fibrillation ([place])    Dr. Gwenlyn Found   Cardiomyopathy    nonischemic   Dyspnea    Erectile dysfunction    GERD (gastroesophageal reflux disease)    Headache    History of kidney stones    History of thyroid cancer    Hyperlipidemia    Hypothyroidism    Pseudogout    Stroke ([place]) 01/29/2001   Thyroid cancer [facility])     Past Surgical History:  Procedure Laterality Date   CARDIAC CATHETERIZATION  09/22/2001   NORMAL CORONARY ARTERIES   JOINT REPLACEMENT     BILATERAL KNEE    KNEE SURGERY Bilateral    LEXISCAN MYOCARDIAL PERFUSION STUDY  05/30/2011   NORMAL MYOCARDIAL PERFUSION STUDY EF% 49%.   THYROIDECTOMY     TOTAL KNEE ARTHROPLASTY Right 06/19/2021   Procedure: RIGHT TOTAL KNEE ARTHROPLASTY;  Surgeon: Frederik Pear, MD;  Location: [facility];  Service: Orthopedics;  Laterality: Right;   TRANSTHORACIC ECHOCARDIOGRAM  05/30/2011   EF%=45-50%. NORMAL LV WALL THICKNESS. RV IS MILDLY DILATED. NO SIGNIFICANT VALVE DISEASE.     Vitals:   06/30/21 1020 06/30/21 1050  BP: (!) 89/65 92/64      Subjective Assessment - 06/30/21 1027     Subjective Pt had R knee replacement 5/22. Has history of stroke in 2003 -- has aphasia and drop foot  (currently wearing AFO). Notes R side weakness and decreased ROM at baseline from CVA. Reports increasing falls and knee buckling prior to TKA. Pt reports since R TKA he has not had any falls. Has been receiving HHPT. Last saw ortho yesterday and got his dressings removed.    Patient is accompained by: Family member    How long can you sit comfortably? n/a    How long can you stand comfortably? n/a    How long can you walk comfortably? Able to amb in the house using RW. Can normally amb any where with just his AFO    Patient Stated Goals Return to his normal activity    Currently in Pain? Yes    Pain Score 9     Pain Location Knee    Pain Orientation Right    Pain Descriptors / Indicators Aching    Pain Type Surgical pain    Pain Radiating Towards Whole leg    Pain Onset More than a month ago    Pain Frequency Constant    Aggravating Factors  Knee movement    Pain Relieving Factors Ice    Effect of Pain on Daily Activities Stairs, walking  [facility] PT Assessment - 06/30/21 0001       Assessment   Medical Diagnosis Z96.659 (ICD-10-CM) - S/P knee replacement    Referring Provider (PT) Frederik Pear, MD    Onset Date/Surgical Date 06/19/21    Prior Therapy Yes      Precautions   Precautions Fall      Restrictions   Weight Bearing Restrictions No      Balance Screen   Has the patient fallen in the past 6 months Yes    How many times? >5   due to knee buckling   Has the patient had a decrease in activity level because of a fear of falling?  Yes    Is the patient reluctant to leave their home because of a fear of falling?  Yes      [place] residence    Living Arrangements Spouse/significant other    Available Help at Discharge Family    Type of [place] to enter    Entrance Stairs-Number of Steps 4    Entrance Stairs-Rails Right    [place] One level    [name] seat;Bedside  commode      Prior Function   Level of Independence Independent      Observation/Other Assessments   Focus on Therapeutic Outcomes (FOTO)  30; predicted 51      ROM / Strength   AROM / PROM / Strength AROM;Strength      AROM   AROM Assessment Site Knee    Right/Left Knee Right    Right Knee Extension -20   Pt and wife report this may be baseline   Right Knee Flexion 90      Strength   Overall Strength Comments CVA affecting R side at baseline with drop foot    Strength Assessment Site Hip;Knee    Right/Left Hip Right;Left    Right Hip Flexion 4/5    Right Hip Extension 4/5    Right Hip ABduction 3+/5    Left Hip Flexion 5/5    Left Hip Extension 5/5    Left Hip ABduction 5/5    Right/Left Knee Right;Left    Right Knee Flexion 4-/5    Right Knee Extension 4-/5    Left Knee Flexion 5/5    Left Knee Extension 5/5      Transfers   Comments Needs to utilize hands to come to stand, staggers feet      Ambulation/Gait   Ambulation Distance (Feet) 50 Feet   Limited due to dizziness   Assistive device Rolling walker   uses AFO on R   Gait Pattern Step-through pattern;Decreased stance time - right;Decreased dorsiflexion - right;Decreased weight shift to right;Antalgic;Decreased step length - left;Poor foot clearance - right    Ambulation Surface Level;Indoor                        Objective measurements completed on examination: See above findings.                PT Education - 06/30/21 1313     Education Details Discussed exam findings, POC and advised them to call MD if continued low BP at home.    Person(s) Educated Patient;Spouse    Methods Explanation;Demonstration;Tactile cues;Verbal cues    Comprehension Verbalized understanding;Returned demonstration;Verbal cues required;Tactile cues required  PT Long Term Goals - 06/30/21 1322       PT LONG TERM GOAL #1   Title independent with HEP    Time 8    Period Weeks     Status New    Target Date 08/25/21      PT LONG TERM GOAL #2   Title increase R knee flexion to at least 110 deg for improved stair negotiation    Time 8    Period Weeks    Status New    Target Date 08/25/21      PT LONG TERM GOAL #3   Title FOTO increase to >/=51    Baseline 30    Time 8    Period Weeks    Status New    Target Date 08/25/21      PT LONG TERM GOAL #4   Title Pt will be able to amb at least 1000' with just his AFO for return to community mobility    Time 8    Period Weeks    Status New    Target Date 08/25/21                    Plan - 06/30/21 1315     Clinical Impression Statement Mr. [name]" Formica is a 66 y/o M presenting to OPPT s/p R TKA with history of CVA affecting R side and aphasia. While ambulating to therapy gym, pt with minor LOB and report of dizziness. Found pt to be orthostatic. Did not perform full physical evaluation in light of pt's BP. Advised pt and wife to keep pt supine at home, recheck BP and call MD if any continued issues. Assessment significant for pain, limited R knee ROM, decreased strength and balance affecting home and community mobility. Pt would benefit from PT to address these issues and return to PLOF.    Personal Factors and Comorbidities Comorbidity 1;Time since onset of injury/illness/exacerbation    Comorbidities CVA in 2003    Examination-Activity Limitations Locomotion Level;Bed Mobility;Stairs;Squat;Transfers;Toileting;Hygiene/Grooming;Dressing    Examination-Participation Restrictions Community Activity;Driving;Shop    Stability/Clinical Decision Making Evolving/Moderate complexity    Clinical Decision Making Moderate    Rehab Potential Good    PT Frequency 2x / week    PT Duration 8 weeks    PT Treatment/Interventions ADLs/Self Care Home Management;Aquatic Therapy;Cryotherapy;Electrical Stimulation;Iontophoresis '4mg'$ /ml Dexamethasone;Moist Heat;DME Instruction;Gait training;Stair training;Functional  mobility training;Therapeutic activities;Therapeutic exercise;Balance training;Neuromuscular re-education;Manual techniques;Patient/family education;Passive range of motion;Dry needling;Taping    PT Next Visit Plan Work on knee ROM. Continue strengthening.    PT Home Exercise Plan Will initiate next session    Consulted and Agree with Plan of Care Patient;Family member/caregiver    Family Member Consulted Wife             Patient will benefit from skilled therapeutic intervention in order to improve the following deficits and impairments:  Decreased range of motion, Difficulty walking, Increased fascial restricitons, Increased muscle spasms, Pain, Decreased balance, Hypomobility, Decreased mobility, Decreased strength  Visit Diagnosis: Acute pain of right knee  Stiffness of right knee, not elsewhere classified  Muscle weakness (generalized)     Problem List Patient Active Problem List   Diagnosis Date Noted   S/P TKR (total knee replacement) using cement, right 06/19/2021   Osteoarthritis of right knee 06/15/2021   Immunization refused 10/31/2020   Left wrist pain 02/16/2020   Complex regional pain syndrome type 1 of upper extremity 02/16/2020   Lumbosacral spondylosis 02/16/2020   Lumbar degenerative disc disease 02/16/2020  High risk medications (not anticoagulants) long-term use 02/16/2020   Long-term current use of opiate analgesic 02/16/2020   Chronic pain syndrome 02/16/2020   Chronic low back pain 10/30/2019   Nonischemic cardiomyopathy ([place]) 02/05/2018   Hemiparesis affecting right side as late effect of stroke ([place]) 02/15/2017   Expressive aphasia 02/15/2017   Erectile dysfunction 04/25/2016   Hypothyroidism 04/25/2016   Gastroesophageal reflux disease 04/25/2016   Mixed hyperlipidemia 11/03/2014   Atrial fibrillation ([place]) 04/23/2012   Long term current use of anticoagulant therapy 04/23/2012   History of CVA (cerebrovascular accident) 04/23/2012   Allergic  rhinitis 12/14/2010   Essential hypertension 07/05/2008    [facility] April Gordy Levan, PT, DPT 06/30/2021, 1:24 PM  [facility] [place], [place], [address] Phone: [phone]   Fax:  [phone]  Name: KONNER SAIZ MRN: [id number] Date of Birth: May 25, 1955

## 2021-07-05 ENCOUNTER — Ambulatory Visit: Payer: Medicare HMO | Admitting: Physical Therapy

## 2021-07-05 DIAGNOSIS — M25561 Pain in right knee: Secondary | ICD-10-CM | POA: Diagnosis not present

## 2021-07-05 DIAGNOSIS — M25661 Stiffness of right knee, not elsewhere classified: Secondary | ICD-10-CM | POA: Diagnosis not present

## 2021-07-05 DIAGNOSIS — M6281 Muscle weakness (generalized): Secondary | ICD-10-CM | POA: Diagnosis not present

## 2021-07-05 NOTE — Therapy (Signed)
[place], [place], [address] Phone: [phone]   Fax:  [phone]  Physical Therapy Treatment  Patient Details  Name: Eric Ross MRN: [id number] Date of Birth: 12/16/1955 Referring Provider (PT): Frederik Pear, MD   Encounter Date: 07/05/2021 Rationale for Evaluation and Treatment Rehabilitation   PT End of Session - 07/05/21 1137     Visit Number 2    Number of Visits 12    Date for PT Re-Evaluation 08/11/21    Authorization - Visit Number 2    Progress Note Due on Visit 10    PT Start Time 1058    PT Stop Time 1143    PT Time Calculation (min) 45 min    Activity Tolerance Patient tolerated treatment well    Behavior During Therapy [facility] for tasks assessed/performed             Past Medical History:  Diagnosis Date   Atrial fibrillation ([place])    Dr. Gwenlyn Found   Cardiomyopathy    nonischemic   Dyspnea    Erectile dysfunction    GERD (gastroesophageal reflux disease)    Headache    History of kidney stones    History of thyroid cancer    Hyperlipidemia    Hypothyroidism    Pseudogout    Stroke ([place]) 01/29/2001   Thyroid cancer [facility])     Past Surgical History:  Procedure Laterality Date   CARDIAC CATHETERIZATION  09/22/2001   NORMAL CORONARY ARTERIES   JOINT REPLACEMENT     BILATERAL KNEE    KNEE SURGERY Bilateral    LEXISCAN MYOCARDIAL PERFUSION STUDY  05/30/2011   NORMAL MYOCARDIAL PERFUSION STUDY EF% 49%.   THYROIDECTOMY     TOTAL KNEE ARTHROPLASTY Right 06/19/2021   Procedure: RIGHT TOTAL KNEE ARTHROPLASTY;  Surgeon: Frederik Pear, MD;  [facility];  Service: Orthopedics;  Laterality: Right;   TRANSTHORACIC ECHOCARDIOGRAM  05/30/2011   EF%=45-50%. NORMAL LV WALL THICKNESS. RV IS MILDLY DILATED. NO SIGNIFICANT VALVE DISEASE.     There were no vitals filed for this visit.   Subjective Assessment - 07/05/21 1103     Subjective Pt states his knee continues to  hurt, especially with flexion. He states BP was 97/59 at home    Patient is accompained by: Family member    Patient Stated Goals Return to his normal activity    Currently in Pain? Yes    Pain Score 8     Pain Location Knee    Pain Orientation Right    Pain Descriptors / Indicators Patsi Sears PT Assessment - 07/05/21 0001       Assessment   Medical Diagnosis Z96.659 (ICD-10-CM) - S/P knee replacement    Referring Provider (PT) Frederik Pear, MD    Onset Date/Surgical Date 06/19/21      AROM   Right Knee Flexion 92                           [facility] Adult PT Treatment/Exercise - 07/05/21 0001       Ambulation/Gait   Assistive device Rolling walker    Gait Pattern Step-through pattern;Decreased stance time - right;Decreased dorsiflexion - right;Decreased weight shift to right;Antalgic;Decreased step length - left;Poor foot clearance - right      Exercises   Exercises Knee/Hip      Knee/Hip Exercises: Stretches  Knee: Self-Stretch Limitations seated heel slide with over pressure x 10      Knee/Hip Exercises: Aerobic   Nustep L5 x 5 min for AAROM and warm up      Knee/Hip Exercises: Standing   Lateral Step Up 15 reps;Hand Hold: 2;Step Height: 4"    Forward Step Up 15 reps;Hand Hold: 2;Step Height: 4"      Knee/Hip Exercises: Seated   Sit to Sand 2 sets;5 reps      Knee/Hip Exercises: Supine   Short Arc [employer] 20 reps    Short Arc Quad Sets Limitations cues for coordination to prevent SLR    Heel Slides 2 sets;10 reps;AAROM    Bridges 20 reps    Straight Leg Raises 2 sets;10 reps      Modalities   Modalities Cryotherapy      Cryotherapy   Number Minutes Cryotherapy 10 Minutes    Cryotherapy Location Knee    Type of Cryotherapy Ice pack                          PT Long Term Goals - 06/30/21 1322       PT LONG TERM GOAL #1   Title independent with HEP    Time 8    Period Weeks    Status New     Target Date 08/25/21      PT LONG TERM GOAL #2   Title increase R knee flexion to at least 110 deg for improved stair negotiation    Time 8    Period Weeks    Status New    Target Date 08/25/21      PT LONG TERM GOAL #3   Title FOTO increase to >/=51    Baseline 30    Time 8    Period Weeks    Status New    Target Date 08/25/21      PT LONG TERM GOAL #4   Title Pt will be able to amb at least 1000' with just his AFO for return to community mobility    Time 8    Period Weeks    Status New    Target Date 08/25/21                   Plan - 07/05/21 1138     Clinical Impression Statement Pt with good tolerance to progression of exercises. Increased pain with knee flexion but able to tolerate with rest breaks and encouragement.    PT Next Visit Plan Work on knee ROM. Continue strengthening. progress HEP    PT Home Exercise Plan is performing HEP from HHPT for supine heel slides, SLR, quad sets, SAQ    Consulted and Agree with Plan of Care Patient;Family member/caregiver             Patient will benefit from skilled therapeutic intervention in order to improve the following deficits and impairments:     Visit Diagnosis: Acute pain of right knee  Stiffness of right knee, not elsewhere classified  Muscle weakness (generalized)     Problem List Patient Active Problem List   Diagnosis Date Noted   S/P TKR (total knee replacement) using cement, right 06/19/2021   Osteoarthritis of right knee 06/15/2021   Immunization refused 10/31/2020   Left wrist pain 02/16/2020   Complex regional pain syndrome type 1 of upper extremity 02/16/2020   Lumbosacral spondylosis 02/16/2020   Lumbar degenerative disc disease 02/16/2020  High risk medications (not anticoagulants) long-term use 02/16/2020   Long-term current use of opiate analgesic 02/16/2020   Chronic pain syndrome 02/16/2020   Chronic low back pain 10/30/2019   Nonischemic cardiomyopathy ([place]) 02/05/2018    Hemiparesis affecting right side as late effect of stroke ([place]) 02/15/2017   Expressive aphasia 02/15/2017   Erectile dysfunction 04/25/2016   Hypothyroidism 04/25/2016   Gastroesophageal reflux disease 04/25/2016   Mixed hyperlipidemia 11/03/2014   Atrial fibrillation ([place]) 04/23/2012   Long term current use of anticoagulant therapy 04/23/2012   History of CVA (cerebrovascular accident) 04/23/2012   Allergic rhinitis 12/14/2010   Essential hypertension 07/05/2008    Dougles Kimmey, PT 07/05/2021, 11:40 AM  [facility] [place] [address] [place], [place], [address] Phone: [phone]   Fax:  [phone]  Name: Eric Ross MRN: [id number] Date of Birth: 05/12/1955

## 2021-07-12 ENCOUNTER — Ambulatory Visit: Payer: Medicare HMO | Admitting: Physical Therapy

## 2021-07-12 ENCOUNTER — Encounter: Payer: Self-pay | Admitting: Physical Therapy

## 2021-07-12 DIAGNOSIS — M6281 Muscle weakness (generalized): Secondary | ICD-10-CM

## 2021-07-12 DIAGNOSIS — M25661 Stiffness of right knee, not elsewhere classified: Secondary | ICD-10-CM

## 2021-07-12 DIAGNOSIS — M25561 Pain in right knee: Secondary | ICD-10-CM

## 2021-07-12 NOTE — Therapy (Signed)
[place], [place], [address] Phone: [phone]   Fax:  [phone]  Physical Therapy Treatment  Patient Details  Name: Eric Ross MRN: [id number] Date of Birth: January 21, 1956 Referring Provider (PT): Frederik Pear, MD   Encounter Date: 07/12/2021 Rationale for Evaluation and Treatment Rehabilitation   PT End of Session - 07/12/21 1225     Visit Number 3    Number of Visits 12    Date for PT Re-Evaluation 08/11/21    Authorization Type [employer]etna Medicare    Authorization - Visit Number 3    Progress Note Due on Visit 10    PT Start Time 1025    PT Stop Time 1230    PT Time Calculation (min) 45 min    Activity Tolerance Patient tolerated treatment well    Behavior During Therapy [facility] for tasks assessed/performed             Past Medical History:  Diagnosis Date   Atrial fibrillation ([place])    Dr. Gwenlyn Found   Cardiomyopathy    nonischemic   Dyspnea    Erectile dysfunction    GERD (gastroesophageal reflux disease)    Headache    History of kidney stones    History of thyroid cancer    Hyperlipidemia    Hypothyroidism    Pseudogout    Stroke ([place]) 01/29/2001   Thyroid cancer [facility])     Past Surgical History:  Procedure Laterality Date   CARDIAC CATHETERIZATION  09/22/2001   NORMAL CORONARY ARTERIES   JOINT REPLACEMENT     BILATERAL KNEE    KNEE SURGERY Bilateral    LEXISCAN MYOCARDIAL PERFUSION STUDY  05/30/2011   NORMAL MYOCARDIAL PERFUSION STUDY EF% 49%.   THYROIDECTOMY     TOTAL KNEE ARTHROPLASTY Right 06/19/2021   Procedure: RIGHT TOTAL KNEE ARTHROPLASTY;  Surgeon: Frederik Pear, MD;  [facility];  Service: Orthopedics;  Laterality: Right;   TRANSTHORACIC ECHOCARDIOGRAM  05/30/2011   EF%=45-50%. NORMAL LV WALL THICKNESS. RV IS MILDLY DILATED. NO SIGNIFICANT VALVE DISEASE.     There were no vitals filed for this visit.   Subjective Assessment - 07/12/21 1146      Subjective Pt states he is feeling much better. He has been able to walk without his RW in the house    Patient Stated Goals Return to his normal activity    Currently in Pain? Yes    Pain Score 5     Pain Location Knee    Pain Orientation Right    Pain Descriptors / Indicators Sore;Aching    Pain Type Surgical pain                [facility] PT Assessment - 07/12/21 0001       Assessment   Medical Diagnosis Z96.659 (ICD-10-CM) - S/P knee replacement    Referring Provider (PT) Frederik Pear, MD    Onset Date/Surgical Date 06/19/21      AROM   Right Knee Flexion 98                           [facility] Adult PT Treatment/Exercise - 07/12/21 0001       Ambulation/Gait   Ambulation Distance (Feet) 50 Feet    Assistive device None    Gait Pattern Step-through pattern;Decreased stance time - right    Ambulation Surface Level    Gait Comments gait training with SPC 2 laps throughotu clinic with  step through pattern      Knee/Hip Exercises: Aerobic   Nustep L5 x 5 min for AAROM and warm up      Knee/Hip Exercises: Standing   Heel Raises 20 reps    Heel Raises Limitations with RW for support    Terminal Knee Extension 20 reps    Theraband Level (Terminal Knee Extension) Level 2 (Red)    Terminal Knee Extension Limitations with RW support    Lateral Step Up Hand Hold: 2;20 reps;Step Height: 6"    Forward Step Up 20 reps;Hand Hold: 2;Step Height: 6"      Knee/Hip Exercises: Seated   Heel Slides Limitations heel slides with overpressure for ROM    Sit to Sand 2 sets;5 reps      Knee/Hip Exercises: Supine   Short Arc [employer] 20 reps    Short Arc Quad Sets Limitations cues for coordination    Bridges 20 reps    Straight Leg Raises 2 sets;10 reps      Cryotherapy   Number Minutes Cryotherapy 10 Minutes    Cryotherapy Location Knee    Type of Cryotherapy Ice pack                          PT Long Term Goals - 06/30/21 1322       PT LONG TERM  GOAL #1   Title independent with HEP    Time 8    Period Weeks    Status New    Target Date 08/25/21      PT LONG TERM GOAL #2   Title increase R knee flexion to at least 110 deg for improved stair negotiation    Time 8    Period Weeks    Status New    Target Date 08/25/21      PT LONG TERM GOAL #3   Title FOTO increase to >/=51    Baseline 30    Time 8    Period Weeks    Status New    Target Date 08/25/21      PT LONG TERM GOAL #4   Title Pt will be able to amb at least 1000' with just his AFO for return to community mobility    Time 8    Period Weeks    Status New    Target Date 08/25/21                   Plan - 07/12/21 1226     Clinical Impression Statement Discussed switching from RW to [facility]. Pt with good balance and gait with SPC - he will try to find his cane at home to use. Pt continues to improve ROM. Mostly limited by pain    PT Next Visit Plan Work on knee ROM. Continue strengthening. progress HEP    PT Home Exercise Plan is performing HEP from HHPT for supine heel slides, SLR, quad sets, SAQ    Consulted and Agree with Plan of Care Patient;Family member/caregiver    Family Member Consulted Wife             Patient will benefit from skilled therapeutic intervention in order to improve the following deficits and impairments:     Visit Diagnosis: Acute pain of right knee  Stiffness of right knee, not elsewhere classified  Muscle weakness (generalized)     Problem List Patient Active Problem List   Diagnosis Date Noted   S/P TKR (total knee  replacement) using cement, right 06/19/2021   Osteoarthritis of right knee 06/15/2021   Immunization refused 10/31/2020   Left wrist pain 02/16/2020   Complex regional pain syndrome type 1 of upper extremity 02/16/2020   Lumbosacral spondylosis 02/16/2020   Lumbar degenerative disc disease 02/16/2020   High risk medications (not anticoagulants) long-term use 02/16/2020   Long-term current use of  opiate analgesic 02/16/2020   Chronic pain syndrome 02/16/2020   Chronic low back pain 10/30/2019   Nonischemic cardiomyopathy ([place]) 02/05/2018   Hemiparesis affecting right side as late effect of stroke ([place]) 02/15/2017   Expressive aphasia 02/15/2017   Erectile dysfunction 04/25/2016   Hypothyroidism 04/25/2016   Gastroesophageal reflux disease 04/25/2016   Mixed hyperlipidemia 11/03/2014   Atrial fibrillation ([place]) 04/23/2012   Long term current use of anticoagulant therapy 04/23/2012   History of CVA (cerebrovascular accident) 04/23/2012   Allergic rhinitis 12/14/2010   Essential hypertension 07/05/2008    Hibba Schram, PT 07/12/2021, 12:30 PM  [facility] [place] [address] [place], [place], [address] Phone: [phone]   Fax:  [phone]  Name: Eric Ross MRN: [id number] Date of Birth: 01/01/1956

## 2021-07-14 ENCOUNTER — Ambulatory Visit: Payer: Medicare HMO | Admitting: Physical Therapy

## 2021-07-14 DIAGNOSIS — M25561 Pain in right knee: Secondary | ICD-10-CM

## 2021-07-14 DIAGNOSIS — M6281 Muscle weakness (generalized): Secondary | ICD-10-CM

## 2021-07-14 DIAGNOSIS — M25661 Stiffness of right knee, not elsewhere classified: Secondary | ICD-10-CM

## 2021-07-14 NOTE — Therapy (Signed)
[place], [place], [address] Phone: [phone]   Fax:  [phone]  Physical Therapy Treatment  Patient Details  Name: Eric Ross MRN: [id number] Date of Birth: [birth year] Referring Provider (PT): Frederik Pear, MD   Encounter Date: 07/14/2021 Rationale for Evaluation and Treatment Rehabilitation   PT End of Session - 07/14/21 1142     Visit Number 4    Number of Visits 12    Date for PT Re-Evaluation 08/11/21    Authorization Type [employer]    Authorization - Visit Number 4    Progress Note Due on Visit 10    PT Start Time 1100    PT Stop Time 1145    PT Time Calculation (min) 45 min    Activity Tolerance Patient tolerated treatment well    Behavior During Therapy [facility] for tasks assessed/performed             Past Medical History:  Diagnosis Date   Atrial fibrillation ([place])    Dr. Gwenlyn Found   Cardiomyopathy    nonischemic   Dyspnea    Erectile dysfunction    GERD (gastroesophageal reflux disease)    Headache    History of kidney stones    History of thyroid cancer    Hyperlipidemia    Hypothyroidism    Pseudogout    Stroke ([place]) 01/29/2001   Thyroid cancer [facility])     Past Surgical History:  Procedure Laterality Date   CARDIAC CATHETERIZATION  09/22/2001   NORMAL CORONARY ARTERIES   JOINT REPLACEMENT     BILATERAL KNEE    KNEE SURGERY Bilateral    LEXISCAN MYOCARDIAL PERFUSION STUDY  05/30/2011   NORMAL MYOCARDIAL PERFUSION STUDY EF% 49%.   THYROIDECTOMY     TOTAL KNEE ARTHROPLASTY Right 06/19/2021   Procedure: RIGHT TOTAL KNEE ARTHROPLASTY;  Surgeon: Frederik Pear, MD;  Location: [facility];  Service: Orthopedics;  Laterality: Right;   TRANSTHORACIC ECHOCARDIOGRAM  05/30/2011   EF%=45-50%. NORMAL LV WALL THICKNESS. RV IS MILDLY DILATED. NO SIGNIFICANT VALVE DISEASE.     There were no vitals filed for this visit.   Subjective Assessment - 07/14/21 1114      Subjective Pt states he is using his Rt LE much more. He is walking with the [facility] now    Currently in Pain? Yes    Pain Score 5     Pain Location Knee    Pain Orientation Right                [facility] PT Assessment - 07/14/21 0001       Assessment   Medical Diagnosis Z96.659 (ICD-10-CM) - S/P knee replacement    Referring Provider (PT) Frederik Pear, MD    Onset Date/Surgical Date 06/19/21      AROM   Right Knee Flexion 108                           [facility] Adult PT Treatment/Exercise - 07/14/21 0001       Knee/Hip Exercises: Aerobic   Recumbent Bike x 1 minute - full revolutions    Nustep L5 x 5 min for AAROM and warm up      Knee/Hip Exercises: Standing   Lateral Step Up Limitations split stance aerobic step up x 20 6'' bilat UE support    Forward Step Up Hand Hold: 1;Step Height: 6";20 reps    Wall Squat 15 reps  Other Standing Knee Exercises sled push/pull 35# x 2      Knee/Hip Exercises: Seated   Heel Slides Limitations heel slides with overpressure for ROM      Knee/Hip Exercises: Supine   Short Arc Quad Sets 20 reps    Short Arc Quad Sets Limitations cues for coordination      Cryotherapy   Number Minutes Cryotherapy 10 Minutes    Cryotherapy Location Knee    Type of Cryotherapy Ice pack                          PT Long Term Goals - 06/30/21 1322       PT LONG TERM GOAL #1   Title independent with HEP    Time 8    Period Weeks    Status New    Target Date 08/25/21      PT LONG TERM GOAL #2   Title increase R knee flexion to at least 110 deg for improved stair negotiation    Time 8    Period Weeks    Status New    Target Date 08/25/21      PT LONG TERM GOAL #3   Title FOTO increase to >/=51    Baseline 30    Time 8    Period Weeks    Status New    Target Date 08/25/21      PT LONG TERM GOAL #4   Title Pt will be able to amb at least 1000' with just his AFO for return to community mobility    Time 8     Period Weeks    Status New    Target Date 08/25/21                   Plan - 07/14/21 1142     Clinical Impression Statement Pt with much improved ROM today - able to pedal recumbant bike fully. progressed to wall squats and sled push pull to progress strength and motor control    PT Next Visit Plan Work on knee ROM. Continue strengthening. progress HEP    PT Home Exercise Plan is performing HEP from [facility] for supine heel slides, SLR, quad sets, SAQ    Consulted and Agree with Plan of Care Patient;Family member/caregiver             Patient will benefit from skilled therapeutic intervention in order to improve the following deficits and impairments:     Visit Diagnosis: Acute pain of right knee  Stiffness of right knee, not elsewhere classified  Muscle weakness (generalized)     Problem List Patient Active Problem List   Diagnosis Date Noted   S/P TKR (total knee replacement) using cement, right 06/19/2021   Osteoarthritis of right knee 06/15/2021   Immunization refused 10/31/2020   Left wrist pain 02/16/2020   Complex regional pain syndrome type 1 of upper extremity 02/16/2020   Lumbosacral spondylosis 02/16/2020   Lumbar degenerative disc disease 02/16/2020   High risk medications (not anticoagulants) long-term use 02/16/2020   Long-term current use of opiate analgesic 02/16/2020   Chronic pain syndrome 02/16/2020   Chronic low back pain 10/30/2019   Nonischemic cardiomyopathy ([place]) 02/05/2018   Hemiparesis affecting right side as late effect of stroke ([place]) 02/15/2017   Expressive aphasia 02/15/2017   Erectile dysfunction 04/25/2016   Hypothyroidism 04/25/2016   Gastroesophageal reflux disease 04/25/2016   Mixed hyperlipidemia 11/03/2014   Atrial fibrillation ([place]) 04/23/2012  Long term current use of anticoagulant therapy 04/23/2012   History of CVA (cerebrovascular accident) 04/23/2012   Allergic rhinitis 12/14/2010   Essential hypertension  07/05/2008    Ketzaly Cardella, PT 07/14/2021, 11:48 AM  [facility] [place], [place], [address] Phone: [phone]   Fax:  [phone]  Name: JAMONT MELLIN MRN: [id number] Date of Birth: 07/31/1955

## 2021-07-18 ENCOUNTER — Ambulatory Visit: Payer: Medicare HMO | Admitting: Physical Therapy

## 2021-07-18 DIAGNOSIS — M6281 Muscle weakness (generalized): Secondary | ICD-10-CM

## 2021-07-18 DIAGNOSIS — M25561 Pain in right knee: Secondary | ICD-10-CM | POA: Diagnosis not present

## 2021-07-18 DIAGNOSIS — M25661 Stiffness of right knee, not elsewhere classified: Secondary | ICD-10-CM | POA: Diagnosis not present

## 2021-07-18 NOTE — Therapy (Signed)
[place], [place], [address] Phone: [phone]   Fax:  [phone]  Physical Therapy Treatment  Patient Details  Name: Eric Ross MRN: [id number] Date of Birth: 1955-07-13 Referring Provider (PT): Frederik Pear, MD   Encounter Date: 07/18/2021 Rationale for Evaluation and Treatment Rehabilitation   PT End of Session - 07/18/21 1227     Visit Number 5    Number of Visits 12    Date for PT Re-Evaluation 08/11/21    Authorization - Visit Number 5    Progress Note Due on Visit 10    PT Start Time 3419    PT Stop Time 1227    PT Time Calculation (min) 42 min    Activity Tolerance Patient tolerated treatment well    Behavior During Therapy [facility] for tasks assessed/performed             Past Medical History:  Diagnosis Date   Atrial fibrillation ([place])    Dr. Gwenlyn Found   Cardiomyopathy    nonischemic   Dyspnea    Erectile dysfunction    GERD (gastroesophageal reflux disease)    Headache    History of kidney stones    History of thyroid cancer    Hyperlipidemia    Hypothyroidism    Pseudogout    Stroke ([place]) 01/29/2001   Thyroid cancer [facility])     Past Surgical History:  Procedure Laterality Date   CARDIAC CATHETERIZATION  09/22/2001   NORMAL CORONARY ARTERIES   JOINT REPLACEMENT     BILATERAL KNEE    KNEE SURGERY Bilateral    LEXISCAN MYOCARDIAL PERFUSION STUDY  05/30/2011   NORMAL MYOCARDIAL PERFUSION STUDY EF% 49%.   THYROIDECTOMY     TOTAL KNEE ARTHROPLASTY Right 06/19/2021   Procedure: RIGHT TOTAL KNEE ARTHROPLASTY;  Surgeon: Frederik Pear, MD;  [facility];  Service: Orthopedics;  Laterality: Right;   TRANSTHORACIC ECHOCARDIOGRAM  05/30/2011   EF%=45-50%. NORMAL LV WALL THICKNESS. RV IS MILDLY DILATED. NO SIGNIFICANT VALVE DISEASE.     There were no vitals filed for this visit.   Subjective Assessment - 07/18/21 1149     Subjective pt states he has had more  swelling in knee and ankle the past few days    Patient Stated Goals Return to his normal activity    Currently in Pain? Yes    Pain Score 5     Pain Location Knee    Pain Orientation Right                [facility] PT Assessment - 07/18/21 0001       Assessment   Medical Diagnosis Z96.659 (ICD-10-CM) - S/P knee replacement    Referring Provider (PT) Frederik Pear, MD    Onset Date/Surgical Date 06/19/21    Next MD Visit 6/27      AROM   Right Knee Flexion 115                           [facility] Adult PT Treatment/Exercise - 07/18/21 0001       Knee/Hip Exercises: Aerobic   Recumbent Bike x 5 min L1      Knee/Hip Exercises: Machines for Strengthening   Total Gym Leg Press bilat LE 6 plates x 10, single leg 2 plates x 10      Knee/Hip Exercises: Standing   Lateral Step Up Limitations split stance aerobic step up x 20 6'' bilat UE support  Forward Step Up Hand Hold: 1;Step Height: 6";20 reps    Wall Squat 15 reps    Wall Squat Limitations 3 sec hold    Other Standing Knee Exercises sled push/pull 35# x 2      Knee/Hip Exercises: Supine   Bridges Limitations bridge with kick out on Lt LE 3 x 5    Straight Leg Raises 20 reps    Straight Leg Raises Limitations cues for quad set      Knee/Hip Exercises: Sidelying   Clams 20 with red TB      Cryotherapy   Number Minutes Cryotherapy 10 Minutes    Cryotherapy Location Knee    Type of Cryotherapy Ice pack                          PT Long Term Goals - 06/30/21 1322       PT LONG TERM GOAL #1   Title independent with HEP    Time 8    Period Weeks    Status New    Target Date 08/25/21      PT LONG TERM GOAL #2   Title increase R knee flexion to at least 110 deg for improved stair negotiation    Time 8    Period Weeks    Status New    Target Date 08/25/21      PT LONG TERM GOAL #3   Title FOTO increase to >/=51    Baseline 30    Time 8    Period Weeks    Status New    Target  Date 08/25/21      PT LONG TERM GOAL #4   Title Pt will be able to amb at least 1000' with just his AFO for return to community mobility    Time 8    Period Weeks    Status New    Target Date 08/25/21                   Plan - 07/18/21 1227     Clinical Impression Statement Pt continues to progress ROM. He is able to fully pedal recumbant bike and is able to perform leg press machine today. He is mainly limited by pain and edema. Responds well to ice at end of session. PT continues to encourage ice at home    PT Next Visit Plan note for MD,Work on knee ROM. Continue strengthening. progress HEP    PT Home Exercise Plan is performing HEP from [place] for supine heel slides, SLR, quad sets, SAQ    Consulted and Agree with Plan of Care Patient             Patient will benefit from skilled therapeutic intervention in order to improve the following deficits and impairments:     Visit Diagnosis: Acute pain of right knee  Stiffness of right knee, not elsewhere classified  Muscle weakness (generalized)     Problem List Patient Active Problem List   Diagnosis Date Noted   S/P TKR (total knee replacement) using cement, right 06/19/2021   Osteoarthritis of right knee 06/15/2021   Immunization refused 10/31/2020   Left wrist pain 02/16/2020   Complex regional pain syndrome type 1 of upper extremity 02/16/2020   Lumbosacral spondylosis 02/16/2020   Lumbar degenerative disc disease 02/16/2020   High risk medications (not anticoagulants) long-term use 02/16/2020   Long-term current use of opiate analgesic 02/16/2020   Chronic pain syndrome 02/16/2020  Chronic low back pain 10/30/2019   Nonischemic cardiomyopathy ([place]) 02/05/2018   Hemiparesis affecting right side as late effect of stroke ([place]) 02/15/2017   Expressive aphasia 02/15/2017   Erectile dysfunction 04/25/2016   Hypothyroidism 04/25/2016   Gastroesophageal reflux disease 04/25/2016   Mixed hyperlipidemia  11/03/2014   Atrial fibrillation ([place]) 04/23/2012   Long term current use of anticoagulant therapy 04/23/2012   History of CVA (cerebrovascular accident) 04/23/2012   Allergic rhinitis 12/14/2010   Essential hypertension 07/05/2008    Kendell Gammon, PT 07/18/2021, 12:29 PM  [facility] [place], [place], [address] Phone: [phone]   Fax:  [phone]  Name: ARVEL OQUINN MRN: [id number] Date of Birth: August 19, 1955

## 2021-07-20 ENCOUNTER — Ambulatory Visit: Payer: Medicare HMO | Admitting: Physical Therapy

## 2021-07-20 DIAGNOSIS — M25562 Pain in left knee: Secondary | ICD-10-CM | POA: Diagnosis not present

## 2021-07-20 DIAGNOSIS — G894 Chronic pain syndrome: Secondary | ICD-10-CM | POA: Diagnosis not present

## 2021-07-20 DIAGNOSIS — M47817 Spondylosis without myelopathy or radiculopathy, lumbosacral region: Secondary | ICD-10-CM | POA: Diagnosis not present

## 2021-07-20 DIAGNOSIS — M6281 Muscle weakness (generalized): Secondary | ICD-10-CM

## 2021-07-20 DIAGNOSIS — Z79899 Other long term (current) drug therapy: Secondary | ICD-10-CM | POA: Diagnosis not present

## 2021-07-20 DIAGNOSIS — M25532 Pain in left wrist: Secondary | ICD-10-CM | POA: Diagnosis not present

## 2021-07-20 DIAGNOSIS — M25661 Stiffness of right knee, not elsewhere classified: Secondary | ICD-10-CM

## 2021-07-20 DIAGNOSIS — M25561 Pain in right knee: Secondary | ICD-10-CM | POA: Diagnosis not present

## 2021-07-20 DIAGNOSIS — M545 Low back pain, unspecified: Secondary | ICD-10-CM | POA: Diagnosis not present

## 2021-07-20 DIAGNOSIS — G8929 Other chronic pain: Secondary | ICD-10-CM | POA: Diagnosis not present

## 2021-07-20 DIAGNOSIS — Z79891 Long term (current) use of opiate analgesic: Secondary | ICD-10-CM | POA: Diagnosis not present

## 2021-07-20 DIAGNOSIS — M5136 Other intervertebral disc degeneration, lumbar region: Secondary | ICD-10-CM | POA: Diagnosis not present

## 2021-07-20 NOTE — Therapy (Addendum)
[place], [place], [address] Phone: [phone]   Fax:  [phone]  Physical Therapy Treatment and Discharge  Patient Details  Name: Eric Ross MRN: [id number] Date of Birth: 1955-12-25 Referring Provider (PT): Frederik Pear, MD   Encounter Date: 07/20/2021   PT End of Session - 07/20/21 1151     Visit Number 6    Number of Visits 12    Date for PT Re-Evaluation 08/11/21    Authorization - Visit Number 6    Progress Note Due on Visit 10    PT Start Time 1151    PT Stop Time 1230    PT Time Calculation (min) 39 min    Activity Tolerance Patient tolerated treatment well    Behavior During Therapy [facility] for tasks assessed/performed             Past Medical History:  Diagnosis Date   Atrial fibrillation ([place])    Dr. Gwenlyn Found   Cardiomyopathy    nonischemic   Dyspnea    Erectile dysfunction    GERD (gastroesophageal reflux disease)    Headache    History of kidney stones    History of thyroid cancer    Hyperlipidemia    Hypothyroidism    Pseudogout    Stroke ([place]) 01/29/2001   Thyroid cancer [facility])     Past Surgical History:  Procedure Laterality Date   CARDIAC CATHETERIZATION  09/22/2001   NORMAL CORONARY ARTERIES   JOINT REPLACEMENT     BILATERAL KNEE    KNEE SURGERY Bilateral    LEXISCAN MYOCARDIAL PERFUSION STUDY  05/30/2011   NORMAL MYOCARDIAL PERFUSION STUDY EF% 49%.   THYROIDECTOMY     TOTAL KNEE ARTHROPLASTY Right 06/19/2021   Procedure: RIGHT TOTAL KNEE ARTHROPLASTY;  Surgeon: Frederik Pear, MD;  [facility];  Service: Orthopedics;  Laterality: Right;   TRANSTHORACIC ECHOCARDIOGRAM  05/30/2011   EF%=45-50%. NORMAL LV WALL THICKNESS. RV IS MILDLY DILATED. NO SIGNIFICANT VALVE DISEASE.     There were no vitals filed for this visit.   Subjective Assessment - 07/20/21 1154     Subjective Pt reports he twisted his knee earlier this morning. Increased pain  today.    Patient is accompained by: Family member    How long can you sit comfortably? n/a    How long can you stand comfortably? n/a    How long can you walk comfortably? Able to amb in the house using RW. Can normally amb any where with just his AFO    Patient Stated Goals Return to his normal activity    Currently in Pain? Yes    Pain Score 6     Pain Location Knee    Pain Orientation Right    Pain Type Surgical pain                [facility] PT Assessment - 07/20/21 0001       Assessment   Medical Diagnosis Z96.659 (ICD-10-CM) - S/P knee replacement    Referring Provider (PT) Frederik Pear, MD    Onset Date/Surgical Date 06/19/21      AROM   Right Knee Extension -10   seated   Right Knee Flexion 117   seated                          [facility] Adult PT Treatment/Exercise - 07/20/21 0001       Ambulation/Gait   Stairs Yes  Stairs Assistance 5: Supervision    Stair Management Technique One rail Left;Alternating pattern    Number of Stairs 20    Height of Stairs --   4-6"     Knee/Hip Exercises: Stretches   Passive Hamstring Stretch Right;30 seconds    Knee: Self-Stretch to increase Flexion Right;3 reps;20 seconds    Knee: Self-Stretch Limitations seated    Other Knee/Hip Stretches foot propped knee extension stretch x 2 min      Knee/Hip Exercises: Aerobic   Recumbent Bike x3 min L1 forward, x 3 min backward      Knee/Hip Exercises: Standing   Other Standing Knee Exercises 6" step tap 2x10 L & R      Knee/Hip Exercises: Supine   Quad Sets 10 reps    Quad Sets Limitations with R foot propped on bolster    Straight Leg Raises 20 reps    Straight Leg Raises Limitations cues for quad set      Knee/Hip Exercises: Prone   Hamstring Curl 20 reps    Hip Extension Right;10 reps                          PT Long Term Goals - 06/30/21 1322       PT LONG TERM GOAL #1   Title independent with HEP    Time 8    Period Weeks    Status  New    Target Date 08/25/21      PT LONG TERM GOAL #2   Title increase R knee flexion to at least 110 deg for improved stair negotiation    Time 8    Period Weeks    Status New    Target Date 08/25/21      PT LONG TERM GOAL #3   Title FOTO increase to >/=51    Baseline 30    Time 8    Period Weeks    Status New    Target Date 08/25/21      PT LONG TERM GOAL #4   Title Pt will be able to amb at least 1000' with just his AFO for return to community mobility    Time 8    Period Weeks    Status New    Target Date 08/25/21                   Plan - 07/20/21 1229     Clinical Impression Statement Pt's range continues to improve. R knee is now 10-117 deg. Initiated standing balance and stabilization exercises on stairs. Pt able to perform stairs with reciprocal pattern. He continues to make good progress.    Personal Factors and Comorbidities Comorbidity 1;Time since onset of injury/illness/exacerbation    Comorbidities CVA in 2003    Examination-Activity Limitations Locomotion Level;Bed Mobility;Stairs;Squat;Transfers;Toileting;Hygiene/Grooming;Dressing    Stability/Clinical Decision Making Evolving/Moderate complexity    PT Treatment/Interventions ADLs/Self Care Home Management;Aquatic Therapy;Cryotherapy;Electrical Stimulation;Iontophoresis 34m/ml Dexamethasone;Moist Heat;DME Instruction;Gait training;Stair training;Functional mobility training;Therapeutic activities;Therapeutic exercise;Balance training;Neuromuscular re-education;Manual techniques;Patient/family education;Passive range of motion;Dry needling;Taping    PT Next Visit Plan Work on knee ROM. Continue strengthening. progress HEP    PT Home Exercise Plan is performing HEP from [facility] for supine heel slides, SLR, quad sets, SAQ    Consulted and Agree with Plan of Care Patient    Family Member Consulted Wife             Patient will benefit from skilled therapeutic intervention in order to improve  the  following deficits and impairments:  Decreased range of motion, Difficulty walking, Increased fascial restricitons, Increased muscle spasms, Pain, Decreased balance, Hypomobility, Decreased mobility, Decreased strength  Visit Diagnosis: Acute pain of right knee  Stiffness of right knee, not elsewhere classified  Muscle weakness (generalized)     Problem List Patient Active Problem List   Diagnosis Date Noted   S/P TKR (total knee replacement) using cement, right 06/19/2021   Osteoarthritis of right knee 06/15/2021   Immunization refused 10/31/2020   Left wrist pain 02/16/2020   Complex regional pain syndrome type 1 of upper extremity 02/16/2020   Lumbosacral spondylosis 02/16/2020   Lumbar degenerative disc disease 02/16/2020   High risk medications (not anticoagulants) long-term use 02/16/2020   Long-term current use of opiate analgesic 02/16/2020   Chronic pain syndrome 02/16/2020   Chronic low back pain 10/30/2019   Nonischemic cardiomyopathy ([place]) 02/05/2018   Hemiparesis affecting right side as late effect of stroke ([place]) 02/15/2017   Expressive aphasia 02/15/2017   Erectile dysfunction 04/25/2016   Hypothyroidism 04/25/2016   Gastroesophageal reflux disease 04/25/2016   Mixed hyperlipidemia 11/03/2014   Atrial fibrillation ([place]) 04/23/2012   Long term current use of anticoagulant therapy 04/23/2012   History of CVA (cerebrovascular accident) 04/23/2012   Allergic rhinitis 12/14/2010   Essential hypertension 07/05/2008  PHYSICAL THERAPY DISCHARGE SUMMARY  Visits from Start of Care: 6  Current functional level related to goals / functional outcomes: Improving gait, strength and ROM   Remaining deficits: See above   Education / Equipment: HEP   Patient agrees to discharge. Patient goals were partially met. Patient is being discharged due to not returning since the last visit.  Isabelle Course, PT,DPT08/16/239:26 AM  Hinton Dyer, PT,  DPT 07/20/2021, 12:32 PM  [facility] [place], [place], [address] Phone: [phone]   Fax:  [phone]  Name: ALICIA ACKERT MRN: [id number] Date of Birth: 1955/11/15

## 2021-07-28 ENCOUNTER — Telehealth: Payer: Self-pay

## 2021-07-28 DIAGNOSIS — I4891 Unspecified atrial fibrillation: Secondary | ICD-10-CM

## 2021-07-28 NOTE — Telephone Encounter (Signed)
Reminded pt's wife to have INR lab drawn

## 2021-07-29 ENCOUNTER — Other Ambulatory Visit: Payer: Self-pay | Admitting: Cardiovascular Disease

## 2021-07-31 NOTE — Telephone Encounter (Signed)
Pt goes to a standing lab to have drawn; Called pt to remind since it was due on Friday, June 30th. Spoke with pt's wife and states going after he mows the yard. She states he is not out of warfarin at this time.

## 2021-08-02 ENCOUNTER — Telehealth: Payer: Self-pay | Admitting: *Deleted

## 2021-08-02 NOTE — Telephone Encounter (Signed)
Called pt again and asked if he had gone to the lab and wife stated the pt did not go on Monday as he had planned but will go today. Advised that it is important that he goes today since he was due on 07/28/2021 and she verbalized understanding and stated he would go by noon today.

## 2021-08-03 ENCOUNTER — Telehealth: Payer: Self-pay

## 2021-08-03 ENCOUNTER — Other Ambulatory Visit: Payer: Self-pay

## 2021-08-03 DIAGNOSIS — I4891 Unspecified atrial fibrillation: Secondary | ICD-10-CM | POA: Diagnosis not present

## 2021-08-03 MED ORDER — WARFARIN SODIUM 5 MG PO TABS: ORAL_TABLET | ORAL | 3 refills | Status: DC

## 2021-08-03 NOTE — Telephone Encounter (Signed)
Lpm to have INR drawn today.

## 2021-08-04 ENCOUNTER — Ambulatory Visit (INDEPENDENT_AMBULATORY_CARE_PROVIDER_SITE_OTHER): Payer: Medicare HMO | Admitting: *Deleted

## 2021-08-04 DIAGNOSIS — Z7901 Long term (current) use of anticoagulants: Secondary | ICD-10-CM | POA: Diagnosis not present

## 2021-08-04 DIAGNOSIS — Z8673 Personal history of transient ischemic attack (TIA), and cerebral infarction without residual deficits: Secondary | ICD-10-CM

## 2021-08-04 DIAGNOSIS — I4821 Permanent atrial fibrillation: Secondary | ICD-10-CM | POA: Diagnosis not present

## 2021-08-04 LAB — PROTIME-INR
INR: 3 — ABNORMAL HIGH (ref 0.9–1.2)
Prothrombin Time: 30.2 s — ABNORMAL HIGH (ref 9.1–12.0)

## 2021-08-04 NOTE — Patient Instructions (Signed)
Description   Spoke with wife and advised pt to continue taking 1 tablet (5 mg) daily except 1/2 tablet (2.5 mg) each Monday, Wednesday and Friday. Repeat INR in 4 weeks at [facility] (has standing order). [phone]      

## 2021-08-17 DIAGNOSIS — M25561 Pain in right knee: Secondary | ICD-10-CM | POA: Diagnosis not present

## 2021-08-17 DIAGNOSIS — Z79891 Long term (current) use of opiate analgesic: Secondary | ICD-10-CM | POA: Diagnosis not present

## 2021-08-17 DIAGNOSIS — M5136 Other intervertebral disc degeneration, lumbar region: Secondary | ICD-10-CM | POA: Diagnosis not present

## 2021-08-17 DIAGNOSIS — M25562 Pain in left knee: Secondary | ICD-10-CM | POA: Diagnosis not present

## 2021-08-17 DIAGNOSIS — M47817 Spondylosis without myelopathy or radiculopathy, lumbosacral region: Secondary | ICD-10-CM | POA: Diagnosis not present

## 2021-08-17 DIAGNOSIS — M25532 Pain in left wrist: Secondary | ICD-10-CM | POA: Diagnosis not present

## 2021-08-17 DIAGNOSIS — M545 Low back pain, unspecified: Secondary | ICD-10-CM | POA: Diagnosis not present

## 2021-08-17 DIAGNOSIS — G8929 Other chronic pain: Secondary | ICD-10-CM | POA: Diagnosis not present

## 2021-08-17 DIAGNOSIS — G894 Chronic pain syndrome: Secondary | ICD-10-CM | POA: Diagnosis not present

## 2021-08-17 DIAGNOSIS — Z79899 Other long term (current) drug therapy: Secondary | ICD-10-CM | POA: Diagnosis not present

## 2021-08-31 DIAGNOSIS — I4891 Unspecified atrial fibrillation: Secondary | ICD-10-CM | POA: Diagnosis not present

## 2021-09-01 ENCOUNTER — Ambulatory Visit (INDEPENDENT_AMBULATORY_CARE_PROVIDER_SITE_OTHER): Payer: Medicare HMO | Admitting: Cardiology

## 2021-09-01 ENCOUNTER — Telehealth: Payer: Self-pay

## 2021-09-01 DIAGNOSIS — Z7901 Long term (current) use of anticoagulants: Secondary | ICD-10-CM | POA: Diagnosis not present

## 2021-09-01 DIAGNOSIS — Z8673 Personal history of transient ischemic attack (TIA), and cerebral infarction without residual deficits: Secondary | ICD-10-CM

## 2021-09-01 LAB — PROTIME-INR
INR: 2.5 — ABNORMAL HIGH (ref 0.9–1.2)
Prothrombin Time: 25.5 s — ABNORMAL HIGH (ref 9.1–12.0)

## 2021-09-01 NOTE — Telephone Encounter (Signed)
Lp's wife message to call and discuss INR result from 8/3.

## 2021-09-13 DIAGNOSIS — Z79899 Other long term (current) drug therapy: Secondary | ICD-10-CM | POA: Diagnosis not present

## 2021-09-13 DIAGNOSIS — G894 Chronic pain syndrome: Secondary | ICD-10-CM | POA: Diagnosis not present

## 2021-09-13 DIAGNOSIS — Z79891 Long term (current) use of opiate analgesic: Secondary | ICD-10-CM | POA: Diagnosis not present

## 2021-09-13 DIAGNOSIS — G8929 Other chronic pain: Secondary | ICD-10-CM | POA: Diagnosis not present

## 2021-09-13 DIAGNOSIS — M545 Low back pain, unspecified: Secondary | ICD-10-CM | POA: Diagnosis not present

## 2021-09-13 DIAGNOSIS — M25562 Pain in left knee: Secondary | ICD-10-CM | POA: Diagnosis not present

## 2021-09-13 DIAGNOSIS — M25532 Pain in left wrist: Secondary | ICD-10-CM | POA: Diagnosis not present

## 2021-09-13 DIAGNOSIS — M47817 Spondylosis without myelopathy or radiculopathy, lumbosacral region: Secondary | ICD-10-CM | POA: Diagnosis not present

## 2021-09-13 DIAGNOSIS — M25561 Pain in right knee: Secondary | ICD-10-CM | POA: Diagnosis not present

## 2021-09-13 DIAGNOSIS — M5136 Other intervertebral disc degeneration, lumbar region: Secondary | ICD-10-CM | POA: Diagnosis not present

## 2021-09-19 DIAGNOSIS — Z09 Encounter for follow-up examination after completed treatment for conditions other than malignant neoplasm: Secondary | ICD-10-CM | POA: Diagnosis not present

## 2021-09-19 DIAGNOSIS — Z96651 Presence of right artificial knee joint: Secondary | ICD-10-CM | POA: Diagnosis not present

## 2021-09-29 ENCOUNTER — Telehealth: Payer: Self-pay

## 2021-09-29 NOTE — Telephone Encounter (Signed)
I spoke to patient and wife and reminded them to have INR drawn today.  Verbalized understanding

## 2021-10-03 ENCOUNTER — Other Ambulatory Visit: Payer: Self-pay

## 2021-10-03 DIAGNOSIS — I4891 Unspecified atrial fibrillation: Secondary | ICD-10-CM

## 2021-10-04 ENCOUNTER — Ambulatory Visit (INDEPENDENT_AMBULATORY_CARE_PROVIDER_SITE_OTHER): Payer: Medicare HMO | Admitting: *Deleted

## 2021-10-04 DIAGNOSIS — I4821 Permanent atrial fibrillation: Secondary | ICD-10-CM

## 2021-10-04 DIAGNOSIS — Z7901 Long term (current) use of anticoagulants: Secondary | ICD-10-CM

## 2021-10-04 DIAGNOSIS — Z8673 Personal history of transient ischemic attack (TIA), and cerebral infarction without residual deficits: Secondary | ICD-10-CM

## 2021-10-04 LAB — PROTIME-INR
INR: 2.3 — ABNORMAL HIGH (ref 0.9–1.2)
Prothrombin Time: 23.4 s — ABNORMAL HIGH (ref 9.1–12.0)

## 2021-10-04 NOTE — Patient Instructions (Signed)
Description   Spoke with wife and advised pt to continue taking 1 tablet (5 mg) daily except 1/2 tablet (2.5 mg) each Monday, Wednesday and Friday. Repeat INR in 4 weeks at [facility] (has standing order). [phone]      

## 2021-10-11 DIAGNOSIS — M545 Low back pain, unspecified: Secondary | ICD-10-CM | POA: Diagnosis not present

## 2021-10-11 DIAGNOSIS — G894 Chronic pain syndrome: Secondary | ICD-10-CM | POA: Diagnosis not present

## 2021-10-11 DIAGNOSIS — M5136 Other intervertebral disc degeneration, lumbar region: Secondary | ICD-10-CM | POA: Diagnosis not present

## 2021-10-11 DIAGNOSIS — M47817 Spondylosis without myelopathy or radiculopathy, lumbosacral region: Secondary | ICD-10-CM | POA: Diagnosis not present

## 2021-10-11 DIAGNOSIS — M25561 Pain in right knee: Secondary | ICD-10-CM | POA: Diagnosis not present

## 2021-10-11 DIAGNOSIS — M25562 Pain in left knee: Secondary | ICD-10-CM | POA: Diagnosis not present

## 2021-10-11 DIAGNOSIS — M25532 Pain in left wrist: Secondary | ICD-10-CM | POA: Diagnosis not present

## 2021-10-11 DIAGNOSIS — G8929 Other chronic pain: Secondary | ICD-10-CM | POA: Diagnosis not present

## 2021-10-11 DIAGNOSIS — Z79899 Other long term (current) drug therapy: Secondary | ICD-10-CM | POA: Diagnosis not present

## 2021-10-11 DIAGNOSIS — Z79891 Long term (current) use of opiate analgesic: Secondary | ICD-10-CM | POA: Diagnosis not present

## 2021-10-20 ENCOUNTER — Telehealth: Payer: Self-pay | Admitting: Family Medicine

## 2021-10-20 NOTE — Telephone Encounter (Signed)
Left message for patient to call back and schedule Medicare Annual Wellness Visit (AWV) either virtually or in office. I left my number for patient to call [phone].  Last AWV 10/31/20 please schedule at anytime with health coach

## 2021-10-30 ENCOUNTER — Other Ambulatory Visit: Payer: Self-pay | Admitting: Cardiovascular Disease

## 2021-10-30 ENCOUNTER — Other Ambulatory Visit: Payer: Self-pay | Admitting: Family Medicine

## 2021-10-30 DIAGNOSIS — K219 Gastro-esophageal reflux disease without esophagitis: Secondary | ICD-10-CM

## 2021-10-30 DIAGNOSIS — E7800 Pure hypercholesterolemia, unspecified: Secondary | ICD-10-CM

## 2021-10-30 DIAGNOSIS — I428 Other cardiomyopathies: Secondary | ICD-10-CM

## 2021-10-30 DIAGNOSIS — E039 Hypothyroidism, unspecified: Secondary | ICD-10-CM

## 2021-10-30 DIAGNOSIS — E782 Mixed hyperlipidemia: Secondary | ICD-10-CM

## 2021-10-30 DIAGNOSIS — I4821 Permanent atrial fibrillation: Secondary | ICD-10-CM

## 2021-10-30 DIAGNOSIS — I10 Essential (primary) hypertension: Secondary | ICD-10-CM

## 2021-10-30 NOTE — Telephone Encounter (Signed)
Prescription refill request received for warfarin Lov: 06/15/21 Idolina Primer) Next INR check: 10/31/21 Warfarin tablet strength: '5mg'$   Appropriate dose and refill sent to requested pharmacy.

## 2021-10-31 ENCOUNTER — Other Ambulatory Visit: Payer: Self-pay | Admitting: *Deleted

## 2021-10-31 DIAGNOSIS — I4891 Unspecified atrial fibrillation: Secondary | ICD-10-CM

## 2021-11-01 DIAGNOSIS — I4891 Unspecified atrial fibrillation: Secondary | ICD-10-CM | POA: Diagnosis not present

## 2021-11-02 ENCOUNTER — Ambulatory Visit (INDEPENDENT_AMBULATORY_CARE_PROVIDER_SITE_OTHER): Payer: Medicare HMO | Admitting: Cardiology

## 2021-11-02 DIAGNOSIS — Z5181 Encounter for therapeutic drug level monitoring: Secondary | ICD-10-CM

## 2021-11-02 LAB — PROTIME-INR
INR: 2.4 — ABNORMAL HIGH (ref 0.9–1.2)
Prothrombin Time: 24.6 s — ABNORMAL HIGH (ref 9.1–12.0)

## 2021-11-08 DIAGNOSIS — Z79899 Other long term (current) drug therapy: Secondary | ICD-10-CM | POA: Diagnosis not present

## 2021-11-08 DIAGNOSIS — M25532 Pain in left wrist: Secondary | ICD-10-CM | POA: Diagnosis not present

## 2021-11-08 DIAGNOSIS — M545 Low back pain, unspecified: Secondary | ICD-10-CM | POA: Diagnosis not present

## 2021-11-08 DIAGNOSIS — G8929 Other chronic pain: Secondary | ICD-10-CM | POA: Diagnosis not present

## 2021-11-08 DIAGNOSIS — Z79891 Long term (current) use of opiate analgesic: Secondary | ICD-10-CM | POA: Diagnosis not present

## 2021-11-08 DIAGNOSIS — M25562 Pain in left knee: Secondary | ICD-10-CM | POA: Diagnosis not present

## 2021-11-08 DIAGNOSIS — M47817 Spondylosis without myelopathy or radiculopathy, lumbosacral region: Secondary | ICD-10-CM | POA: Diagnosis not present

## 2021-11-08 DIAGNOSIS — M5136 Other intervertebral disc degeneration, lumbar region: Secondary | ICD-10-CM | POA: Diagnosis not present

## 2021-11-08 DIAGNOSIS — G894 Chronic pain syndrome: Secondary | ICD-10-CM | POA: Diagnosis not present

## 2021-11-08 DIAGNOSIS — M25561 Pain in right knee: Secondary | ICD-10-CM | POA: Diagnosis not present

## 2021-11-09 ENCOUNTER — Ambulatory Visit: Payer: Medicare HMO | Admitting: Family Medicine

## 2021-11-09 DIAGNOSIS — M545 Low back pain, unspecified: Secondary | ICD-10-CM | POA: Diagnosis not present

## 2021-11-10 ENCOUNTER — Ambulatory Visit: Payer: Medicare HMO | Admitting: Family Medicine

## 2021-11-21 ENCOUNTER — Other Ambulatory Visit: Payer: Self-pay | Admitting: Family Medicine

## 2021-11-21 DIAGNOSIS — N529 Male erectile dysfunction, unspecified: Secondary | ICD-10-CM

## 2021-11-21 NOTE — Telephone Encounter (Signed)
Is this okay to refill? 

## 2021-11-22 ENCOUNTER — Encounter: Payer: Self-pay | Admitting: Cardiovascular Disease

## 2021-11-22 ENCOUNTER — Ambulatory Visit: Payer: Medicare HMO | Attending: Cardiovascular Disease | Admitting: Cardiovascular Disease

## 2021-11-22 VITALS — BP 98/72 | HR 73 | Ht 73.0 in | Wt 173.2 lb

## 2021-11-22 DIAGNOSIS — I4821 Permanent atrial fibrillation: Secondary | ICD-10-CM

## 2021-11-22 DIAGNOSIS — I428 Other cardiomyopathies: Secondary | ICD-10-CM

## 2021-11-22 DIAGNOSIS — E782 Mixed hyperlipidemia: Secondary | ICD-10-CM

## 2021-11-22 DIAGNOSIS — I10 Essential (primary) hypertension: Secondary | ICD-10-CM

## 2021-11-22 NOTE — Patient Instructions (Signed)
Medication Instructions:  Your physician recommends that you continue on your current medications as directed. Please refer to the Current Medication list given to you today.  *If you need a refill on your cardiac medications before your next appointment, please call your pharmacy*   Follow-Up: At [facility], you and your health needs are our priority.  As part of our continuing mission to provide you with exceptional heart care, we have created designated Provider Care Teams.  These Care Teams include your primary Cardiologist (physician) and Advanced Practice Providers (APPs -  Physician Assistants and Nurse Practitioners) who all work together to provide you with the care you need, when you need it.  We recommend signing up for the patient portal called "MyChart".  Sign up information is provided on this After Visit Summary.  MyChart is used to connect with patients for Virtual Visits (Telemedicine).  Patients are able to view lab/test results, encounter notes, upcoming appointments, etc.  Non-urgent messages can be sent to your provider as well.   To learn more about what you can do with MyChart, go to [email or web address]://[email or web address].com.    Your next appointment:   12 month(s)  The format for your next appointment:   In Person  Provider:   Jonathan Berry, MD   

## 2021-11-22 NOTE — Assessment & Plan Note (Signed)
History of mixed hyperlipidemia on simvastatin with lipid profile performed 05/30/2021 revealing total cholesterol 158, LDL of 95 and HDL 47.

## 2021-11-22 NOTE — Assessment & Plan Note (Signed)
History of nonischemic cardiomyopathy with an EF in the 45% range and clean coronary arteries by remote cath.  He has no symptoms of heart failure.

## 2021-11-22 NOTE — Progress Notes (Signed)
11/22/2021 Eric Ross   Jul 14, 1955  [id number]  Primary Physician Eric Lung, MD Primary Cardiologist: Lorretta Harp MD Eric Ross, [place]  HPI:  Eric Ross is a 66 y.o.   thin appearing married Caucasian male father of 2, grandfather and 3 grandchildren who is accompanied by his wife Eric Ross today. I last saw him 09/30/2020.[name] He has a history of nonischemic cardiomyopathy cath performed in 2003 that showed normal coronary arteries. He has an ejection fraction in the 45-50% range. He does have chronic A. Fib on Coumadin anticoagulation rate controlled. His other problems include hyperlipidemia on statin therapy and treated hypertension. He does get occasional chest burning and a Myoview stress test performed 05/30/11 which was normal. Since I saw him a year ago he is relatively asymptomatic. Unfortunately his mother-in-law who is 69 years old lives with them as phone and broken several bones. Eric Ross has been taking care of her while she is living with them.    did refer him to Dr. Curt Bears because of dizziness who adjusted his carvedilol.  His symptoms improved.  He was having some atypical chest pain and had a recent Myoview stress test performed 08/17/2020 which was nonischemic.  His EF is in the 45% range.    Since I saw him a year ago he did have uncomplicated right total knee replacement by Dr. Mayer Camel in May of this year which he is recuperating from.  Otherwise he is remained cardiovascular stable.  He denies chest pain or shortness of breath.  He remains on Coumadin oral anticoagulation.   Current Meds  Medication Sig   acetaminophen (TYLENOL) 500 MG tablet Take 500-1,000 mg by mouth every 6 (six) hours as needed (headaches.).   cetirizine (ZYRTEC) 10 MG tablet Take 10 mg by mouth daily as needed for allergies.   diclofenac Sodium (VOLTAREN) 1 % GEL Apply 4 g topically 2 (two) times daily as needed (pain.).   levothyroxine (SYNTHROID) 112 MCG tablet TAKE 1 TABLET  BY MOUTH EVERY DAY   oxyCODONE-acetaminophen (PERCOCET/ROXICET) 5-325 MG tablet Take 1 tablet by mouth every 4 (four) hours as needed for severe pain.   pantoprazole (PROTONIX) 40 MG tablet TAKE 1 TABLET BY MOUTH EVERY DAY   ramipril (ALTACE) 2.5 MG capsule TAKE 1 CAPSULE BY MOUTH EVERY DAY   simvastatin (ZOCOR) 20 MG tablet TAKE 1 TABLET BY MOUTH EVERY DAY   tadalafil (CIALIS) 20 MG tablet TAKE 1 TABLET BY MOUTH ONCE DAILY AS NEEDED FOR ERECTILE DYSFUNCTION   tiZANidine (ZANAFLEX) 2 MG tablet Take 1 tablet (2 mg total) by mouth every 6 (six) hours as needed.   warfarin (COUMADIN) 5 MG tablet TAKE 1/2 TO 1 TABLET DAILY OR AS PRESCRIBED BY COUMADIN CLINIC     Allergies  Allergen Reactions   Penicillins     REACTION: Paralyzed him @ 66 yrs old    Social History   Socioeconomic History   Marital status: Married    Spouse name: Not on file   Number of children: Not on file   Years of education: Not on file   Highest education level: Not on file  Occupational History   Not on file  Tobacco Use   Smoking status: Never   Smokeless tobacco: Current    Types: Chew  Vaping Use   Vaping Use: Never used  Substance and Sexual Activity   Alcohol use: No   Drug use: No   Sexual activity: Yes  Other Topics Concern  Not on file  Social History Narrative   Not on file   Social Determinants of Health   Financial Resource Strain: Not on file  Food Insecurity: Not on file  Transportation Needs: Not on file  Physical Activity: Not on file  Stress: Not on file  Social Connections: Not on file  Intimate Partner Violence: Not on file     Review of Systems: General: negative for chills, fever, night sweats or weight changes.  Cardiovascular: negative for chest pain, dyspnea on exertion, edema, orthopnea, palpitations, paroxysmal nocturnal dyspnea or shortness of breath Dermatological: negative for rash Respiratory: negative for cough or wheezing Urologic: negative for  hematuria Abdominal: negative for nausea, vomiting, diarrhea, bright red blood per rectum, melena, or hematemesis Neurologic: negative for visual changes, syncope, or dizziness All other systems reviewed and are otherwise negative except as noted above.    Blood pressure 98/72, pulse 73, height '6\' 1"'$  (1.854 m), weight 173 lb 3.2 oz (78.6 kg), SpO2 95 %.  General appearance: alert and no distress Neck: no adenopathy, no carotid bruit, no JVD, supple, symmetrical, trachea midline, and thyroid not enlarged, symmetric, no tenderness/mass/nodules Lungs: clear to auscultation bilaterally Heart: irregularly irregular rhythm Extremities: extremities normal, atraumatic, no cyanosis or edema Pulses: 2+ and symmetric Skin: Skin color, texture, turgor normal. No rashes or lesions Neurologic: Grossly normal  EKG atrial fibrillation with a ventricular response of 73.  Personally reviewed this EKG.  ASSESSMENT AND PLAN:   Essential hypertension History of essential hypertension blood pressure measured today at 98/72.  He is on carvedilol and low-dose ramipril.  Atrial fibrillation (HCC) History of chronic A-fib rate controlled on Coumadin oral anticoagulation followed in our clinic.  Mixed hyperlipidemia History of mixed hyperlipidemia on simvastatin with lipid profile performed 05/30/2021 revealing total cholesterol 158, LDL of 95 and HDL 47.  Nonischemic cardiomyopathy (HCC) History of nonischemic cardiomyopathy with an EF in the 45% range and clean coronary arteries by remote cath.  He has no symptoms of heart failure.     Lorretta Harp MD FACP,FACC,FAHA, [facility] 11/22/2021 11:39 AM

## 2021-11-22 NOTE — Assessment & Plan Note (Signed)
History of chronic A-fib rate controlled on Coumadin oral anticoagulation followed in our clinic.

## 2021-11-22 NOTE — Assessment & Plan Note (Signed)
History of essential hypertension blood pressure measured today at 98/72.  He is on carvedilol and low-dose ramipril.

## 2021-11-29 ENCOUNTER — Other Ambulatory Visit: Payer: Self-pay | Admitting: *Deleted

## 2021-11-29 ENCOUNTER — Telehealth: Payer: Self-pay | Admitting: *Deleted

## 2021-11-29 DIAGNOSIS — I4891 Unspecified atrial fibrillation: Secondary | ICD-10-CM

## 2021-11-29 NOTE — Telephone Encounter (Signed)
Spoke with wife (on [place]) in reference to the pt going to have INR lab performed and she stated the pt will go tomorrow. Order has been released to have it performed.

## 2021-11-30 DIAGNOSIS — I4891 Unspecified atrial fibrillation: Secondary | ICD-10-CM | POA: Diagnosis not present

## 2021-12-01 ENCOUNTER — Ambulatory Visit (INDEPENDENT_AMBULATORY_CARE_PROVIDER_SITE_OTHER): Payer: Medicare HMO

## 2021-12-01 VITALS — Ht 73.0 in | Wt 173.0 lb

## 2021-12-01 DIAGNOSIS — Z8673 Personal history of transient ischemic attack (TIA), and cerebral infarction without residual deficits: Secondary | ICD-10-CM

## 2021-12-01 DIAGNOSIS — Z5181 Encounter for therapeutic drug level monitoring: Secondary | ICD-10-CM

## 2021-12-01 DIAGNOSIS — I4821 Permanent atrial fibrillation: Secondary | ICD-10-CM

## 2021-12-01 DIAGNOSIS — I4891 Unspecified atrial fibrillation: Secondary | ICD-10-CM

## 2021-12-01 DIAGNOSIS — Z0000 Encounter for general adult medical examination without abnormal findings: Secondary | ICD-10-CM | POA: Diagnosis not present

## 2021-12-01 DIAGNOSIS — Z7901 Long term (current) use of anticoagulants: Secondary | ICD-10-CM

## 2021-12-01 LAB — PROTIME-INR
INR: 2.1 — ABNORMAL HIGH (ref 0.9–1.2)
Prothrombin Time: 21.9 s — ABNORMAL HIGH (ref 9.1–12.0)

## 2021-12-01 NOTE — Patient Instructions (Signed)
Mr. Eric Ross , Thank you for taking time to come for your Medicare Wellness Visit. I appreciate your ongoing commitment to your health goals. Please review the following plan we discussed and let me know if I can assist you in the future.   Screening recommendations/referrals: Colonoscopy: decline Recommended yearly ophthalmology/optometry visit for glaucoma screening and checkup Recommended yearly dental visit for hygiene and checkup  Vaccinations: Influenza vaccine: decline Pneumococcal vaccine: decline Tdap vaccine: decline Shingles vaccine: decline   Covid-19: decline  Advanced directives: Advance directive discussed with you today. Even though you declined this today please call our office should you change your mind and we can give you the proper paperwork for you to fill out.  Conditions/risks identified: none  Next appointment: Follow up in one year for your annual wellness visit.   Preventive Care 67 Years and Older, Male Preventive care refers to lifestyle choices and visits with your health care provider that can promote health and wellness. What does preventive care include? A yearly physical exam. This is also called an annual well check. Dental exams once or twice a year. Routine eye exams. Ask your health care provider how often you should have your eyes checked. Personal lifestyle choices, including: Daily care of your teeth and gums. Regular physical activity. Eating a healthy diet. Avoiding tobacco and drug use. Limiting alcohol use. Practicing safe sex. Taking low doses of aspirin every day. Taking vitamin and mineral supplements as recommended by your health care provider. What happens during an annual well check? The services and screenings done by your health care provider during your annual well check will depend on your age, overall health, lifestyle risk factors, and family history of disease. Counseling  Your health care provider may ask you questions  about your: Alcohol use. Tobacco use. Drug use. Emotional well-being. Home and relationship well-being. Sexual activity. Eating habits. History of falls. Memory and ability to understand (cognition). Work and work Statistician. Screening  You may have the following tests or measurements: Height, weight, and BMI. Blood pressure. Lipid and cholesterol levels. These may be checked every 5 years, or more frequently if you are over 69 years old. Skin check. Lung cancer screening. You may have this screening every year starting at age 53 if you have a 30-pack-year history of smoking and currently smoke or have quit within the past 15 years. Fecal occult blood test (FOBT) of the stool. You may have this test every year starting at age 63. Flexible sigmoidoscopy or colonoscopy. You may have a sigmoidoscopy every 5 years or a colonoscopy every 10 years starting at age 59. Prostate cancer screening. Recommendations will vary depending on your family history and other risks. Hepatitis C blood test. Hepatitis B blood test. Sexually transmitted disease (STD) testing. Diabetes screening. This is done by checking your blood sugar (glucose) after you have not eaten for a while (fasting). You may have this done every 1-3 years. Abdominal aortic aneurysm (AAA) screening. You may need this if you are a current or former smoker. Osteoporosis. You may be screened starting at age 41 if you are at high risk. Talk with your health care provider about your test results, treatment options, and if necessary, the need for more tests. Vaccines  Your health care provider may recommend certain vaccines, such as: Influenza vaccine. This is recommended every year. Tetanus, diphtheria, and acellular pertussis (Tdap, Td) vaccine. You may need a Td booster every 10 years. Zoster vaccine. You may need this after age 48. Pneumococcal 13-valent  conjugate (PCV13) vaccine. One dose is recommended after age 3. Pneumococcal  polysaccharide (PPSV23) vaccine. One dose is recommended after age 20. Talk to your health care provider about which screenings and vaccines you need and how often you need them. This information is not intended to replace advice given to you by your health care provider. Make sure you discuss any questions you have with your health care provider. Document Released: 02/11/2015 Document Revised: 10/05/2015 Document Reviewed: 11/16/2014 Elsevier Interactive Patient Education  2017 [place] Prevention in the Home Falls can cause injuries. They can happen to people of all ages. There are many things you can do to make your home safe and to help prevent falls. What can I do on the outside of my home? Regularly fix the edges of walkways and driveways and fix any cracks. Remove anything that might make you trip as you walk through a door, such as a raised step or threshold. Trim any bushes or trees on the path to your home. Use bright outdoor lighting. Clear any walking paths of anything that might make someone trip, such as rocks or tools. Regularly check to see if handrails are loose or broken. Make sure that both sides of any steps have handrails. Any raised decks and porches should have guardrails on the edges. Have any leaves, snow, or ice cleared regularly. Use sand or salt on walking paths during winter. Clean up any spills in your garage right away. This includes oil or grease spills. What can I do in the bathroom? Use night lights. Install grab bars by the toilet and in the tub and shower. Do not use towel bars as grab bars. Use non-skid mats or decals in the tub or shower. If you need to sit down in the shower, use a plastic, non-slip stool. Keep the floor dry. Clean up any water that spills on the floor as soon as it happens. Remove soap buildup in the tub or shower regularly. Attach bath mats securely with double-sided non-slip rug tape. Do not have throw rugs and other  things on the floor that can make you trip. What can I do in the bedroom? Use night lights. Make sure that you have a light by your bed that is easy to reach. Do not use any sheets or blankets that are too big for your bed. They should not hang down onto the floor. Have a firm chair that has side arms. You can use this for support while you get dressed. Do not have throw rugs and other things on the floor that can make you trip. What can I do in the kitchen? Clean up any spills right away. Avoid walking on wet floors. Keep items that you use a lot in easy-to-reach places. If you need to reach something above you, use a strong step stool that has a grab bar. Keep electrical cords out of the way. Do not use floor polish or wax that makes floors slippery. If you must use wax, use non-skid floor wax. Do not have throw rugs and other things on the floor that can make you trip. What can I do with my stairs? Do not leave any items on the stairs. Make sure that there are handrails on both sides of the stairs and use them. Fix handrails that are broken or loose. Make sure that handrails are as long as the stairways. Check any carpeting to make sure that it is firmly attached to the stairs. Fix any carpet that  is loose or worn. Avoid having throw rugs at the top or bottom of the stairs. If you do have throw rugs, attach them to the floor with carpet tape. Make sure that you have a light switch at the top of the stairs and the bottom of the stairs. If you do not have them, ask someone to add them for you. What else can I do to help prevent falls? Wear shoes that: Do not have high heels. Have rubber bottoms. Are comfortable and fit you well. Are closed at the toe. Do not wear sandals. If you use a stepladder: Make sure that it is fully opened. Do not climb a closed stepladder. Make sure that both sides of the stepladder are locked into place. Ask someone to hold it for you, if possible. Clearly  mark and make sure that you can see: Any grab bars or handrails. First and last steps. Where the edge of each step is. Use tools that help you move around (mobility aids) if they are needed. These include: Canes. Walkers. Scooters. Crutches. Turn on the lights when you go into a dark area. Replace any light bulbs as soon as they burn out. Set up your furniture so you have a clear path. Avoid moving your furniture around. If any of your floors are uneven, fix them. If there are any pets around you, be aware of where they are. Review your medicines with your doctor. Some medicines can make you feel dizzy. This can increase your chance of falling. Ask your doctor what other things that you can do to help prevent falls. This information is not intended to replace advice given to you by your health care provider. Make sure you discuss any questions you have with your health care provider. Document Released: 11/11/2008 Document Revised: 06/23/2015 Document Reviewed: 02/19/2014 Elsevier Interactive Patient Education  2017 [employer].

## 2021-12-01 NOTE — Progress Notes (Signed)
I connected with  Eric Ross today via telehealth video enabled device and verified that I am speaking with the correct person using two identifiers.   Location: Patient: home Provider: work  Persons participating in virtual visit: Molli Knock, Mrs. Kevork, Joyce LPN  I discussed the limitations, risks, security and privacy concerns of performing an evaluation and management service by video and the availability of in person appointments. The patient expressed understanding and agreed to proceed.   Some vital signs may be absent or patient reported.     Subjective:   Eric Ross is a 66 y.o. male who presents for Medicare Annual/Subsequent preventive examination.  Review of Systems     Cardiac Risk Factors include: advanced age (>72mn, >>76women);dyslipidemia;hypertension;male gender     Objective:    Today's Vitals   12/01/21 1030  Weight: 173 lb (78.5 kg)  Height: '6\' 1"'$  (1.854 m)  PainSc: 7    Body mass index is 22.82 kg/m.     12/01/2021   10:37 AM 06/30/2021   10:26 AM 06/19/2021    5:37 AM 06/07/2021   11:10 AM 10/31/2020    2:49 PM 10/30/2019    1:40 PM 08/27/2019   12:08 PM  Advanced Directives  Does Patient Have a Medical Advance Directive? No No No No No No No  Would patient like information on creating a medical advance directive?  No - Patient declined No - Patient declined No - Patient declined Yes (ED - Information included in AVS) Yes (MAU/Ambulatory/Procedural Areas - Information given)     Current Medications (verified) Outpatient Encounter Medications as of 12/01/2021  Medication Sig   acetaminophen (TYLENOL) 500 MG tablet Take 500-1,000 mg by mouth every 6 (six) hours as needed (headaches.).   Aspirin-Salicylamide-Caffeine (BC FAST PAIN RELIEF) 650-195-33.3 MG PACK Take 1 packet by mouth daily as needed (pain.).   carvedilol (COREG) 25 MG tablet Take 1.5 tablets (37.5 mg total) by mouth 2 (two) times daily.   cetirizine  (ZYRTEC) 10 MG tablet Take 10 mg by mouth daily as needed for allergies.   diclofenac Sodium (VOLTAREN) 1 % GEL Apply 4 g topically 2 (two) times daily as needed (pain.).   levothyroxine (SYNTHROID) 112 MCG tablet TAKE 1 TABLET BY MOUTH EVERY DAY   oxyCODONE-acetaminophen (PERCOCET/ROXICET) 5-325 MG tablet Take 1 tablet by mouth every 4 (four) hours as needed for severe pain.   pantoprazole (PROTONIX) 40 MG tablet TAKE 1 TABLET BY MOUTH EVERY DAY   ramipril (ALTACE) 2.5 MG capsule TAKE 1 CAPSULE BY MOUTH EVERY DAY   simvastatin (ZOCOR) 20 MG tablet TAKE 1 TABLET BY MOUTH EVERY DAY   tadalafil (CIALIS) 20 MG tablet TAKE 1 TABLET BY MOUTH ONCE DAILY AS NEEDED FOR ERECTILE DYSFUNCTION   tiZANidine (ZANAFLEX) 2 MG tablet Take 1 tablet (2 mg total) by mouth every 6 (six) hours as needed.   warfarin (COUMADIN) 5 MG tablet TAKE 1/2 TO 1 TABLET DAILY OR AS PRESCRIBED BY COUMADIN CLINIC   enoxaparin (LOVENOX) 80 MG/0.8ML injection Inject 0.8 mLs (80 mg total) into the skin every 12 (twelve) hours. (Patient not taking: Reported on 11/22/2021)   No facility-administered encounter medications on file as of 12/01/2021.    Allergies (verified) Penicillins   History: Past Medical History:  Diagnosis Date   Atrial fibrillation ([place]    Dr. BGwenlyn Found  Cardiomyopathy    nonischemic   Dyspnea    Erectile dysfunction    GERD (gastroesophageal reflux disease)    Headache  History of kidney stones    History of thyroid cancer    Hyperlipidemia    Hypothyroidism    Pseudogout    Stroke ([place]) 01/29/2001   Thyroid cancer [facility])    Past Surgical History:  Procedure Laterality Date   CARDIAC CATHETERIZATION  09/22/2001   NORMAL CORONARY ARTERIES   JOINT REPLACEMENT     BILATERAL KNEE    KNEE SURGERY Bilateral    LEXISCAN MYOCARDIAL PERFUSION STUDY  05/30/2011   NORMAL MYOCARDIAL PERFUSION STUDY EF% 49%.   THYROIDECTOMY     TOTAL KNEE ARTHROPLASTY Right 06/19/2021   Procedure: RIGHT TOTAL KNEE  ARTHROPLASTY;  Surgeon: Frederik Pear, MD;  Location: [facility];  Service: Orthopedics;  Laterality: Right;   TRANSTHORACIC ECHOCARDIOGRAM  05/30/2011   EF%=45-50%. NORMAL LV WALL THICKNESS. RV IS MILDLY DILATED. NO SIGNIFICANT VALVE DISEASE.    Family History  Problem Relation Age of Onset   Thyroid cancer Mother    Cancer Sister    Stroke Brother    Social History   Socioeconomic History   Marital status: Married    Spouse name: Not on file   Number of children: Not on file   Years of education: Not on file   Highest education level: Not on file  Occupational History   Not on file  Tobacco Use   Smoking status: Never   Smokeless tobacco: Current    Types: Chew  Vaping Use   Vaping Use: Never used  Substance and Sexual Activity   Alcohol use: No   Drug use: No   Sexual activity: Yes  Other Topics Concern   Not on file  Social History Narrative   Not on file   Social Determinants of Health   Financial Resource Strain: Low Risk  (12/01/2021)   Overall Financial Resource Strain (CARDIA)    Difficulty of Paying Living Expenses: Not hard at all  Food Insecurity: No Food Insecurity (12/01/2021)   Hunger Vital Sign    Worried About Running Out of Food in the Last Year: Never true    Ran Out of Food in the Last Year: Never true  Transportation Needs: No Transportation Needs (12/01/2021)   PRAPARE - Hydrologist (Medical): No    Lack of Transportation (Non-Medical): No  Physical Activity: Inactive (12/01/2021)   Exercise Vital Sign    Days of Exercise per Week: 0 days    Minutes of Exercise per Session: 0 min  Stress: No Stress Concern Present (12/01/2021)   [place]    Feeling of Stress : Not at all  Social Connections: Not on file    Tobacco Counseling Ready to quit: Not Answered Counseling given: Not Answered   Clinical Intake:  Pre-visit preparation completed:  Yes  Pain : 0-10 Pain Score: 7  Pain Type: Chronic pain Pain Location: Back (right knee) Pain Orientation: Lower Pain Descriptors / Indicators: Aching, Throbbing Pain Onset: More than a month ago Pain Frequency: Constant     Nutritional Status: BMI of 19-24  Normal Nutritional Risks: None  How often do you need to have someone help you when you read instructions, pamphlets, or other written materials from your doctor or pharmacy?: 1 - Never What is the last grade level you completed in school?: 12th grade  Diabetic? no  Interpreter Needed?: No  Information entered by :: NAllen LPN   Activities of Daily Living    12/01/2021   10:39 AM 06/19/2021  10:45 AM  In your present state of health, do you have any difficulty performing the following activities:  Hearing? 0 0  Vision? 0 0  Difficulty concentrating or making decisions? 0 0  Walking or climbing stairs? 1 0  Dressing or bathing? 0 0  Doing errands, shopping? 1 0  Comment spouse is usually with him   Preparing Food and eating ? N   Using the Toilet? N   In the past six months, have you accidently leaked urine? N   Do you have problems with loss of bowel control? N   Managing your Medications? N   Managing your Finances? N   Housekeeping or managing your Housekeeping? N     Patient Care Team: Denita Lung, MD as PCP - General (Family Medicine) Constance Haw, MD as PCP - Electrophysiology (Cardiology) Lorretta Harp, MD as PCP - Cardiology (Cardiology)  Indicate any recent Medical Services you may have received from other than [facility] providers in the past year (date may be approximate).     Assessment:   This is a routine wellness examination for Iris.  Hearing/Vision screen Vision Screening - Comments:: No regular eye exams,   Dietary issues and exercise activities discussed: Current Exercise Habits: The patient does not participate in regular exercise at present   Goals Addressed              This Visit's Progress    Patient Stated       12/01/2021, no goals       Depression Screen    12/01/2021   10:38 AM 10/31/2020    2:51 PM 10/30/2019    1:42 PM 07/22/2019   10:45 AM 08/08/2018    3:11 PM 11/27/2017    2:02 PM 02/15/2017    2:23 PM  PHQ 2/9 Scores  PHQ - 2 Score 0 0 0 0 0 0 0  PHQ- 9 Score 3          Fall Risk    12/01/2021   10:37 AM 10/31/2020    2:50 PM 10/30/2019    1:41 PM 08/08/2018    3:11 PM 11/27/2017    2:02 PM  [place] in the past year? '1 1 1 1 '$ No  Number falls in past yr: 0 '1 1 1   '$ Injury with Fall? 0 0 0 1   Risk for fall due to : Impaired balance/gait;Impaired mobility;Medication side effect Impaired balance/gait Impaired balance/gait    Follow up Falls evaluation completed;Education provided;Falls prevention discussed Falls evaluation completed       FALL RISK PREVENTION PERTAINING TO THE HOME:  Any stairs in or around the home? Yes  If so, are there any without handrails? No  Home free of loose throw rugs in walkways, pet beds, electrical cords, etc? Yes  Adequate lighting in your home to reduce risk of falls? Yes   ASSISTIVE DEVICES UTILIZED TO PREVENT FALLS:  Life alert? No  Use of a cane, walker or w/c? Yes  Grab bars in the bathroom? Yes  Shower chair or bench in shower? Yes  Elevated toilet seat or a handicapped toilet? Yes   TIMED UP AND GO:  Was the test performed? No .      Cognitive Function:        Immunizations Immunization History  Administered Date(s) Administered   Influenza Inj Mdck Quad Pf 10/19/2016, 10/09/2017   Influenza Split 10/04/2014   Influenza, Seasonal, Injecte, Preservative Fre 10/09/2017   Influenza,inj,Quad  PF,6+ Mos 11/21/2018   Influenza-Unspecified 10/22/2015   Td 04/26/2006    TDAP status: Due, Education has been provided regarding the importance of this vaccine. Advised may receive this vaccine at local pharmacy or Health Dept. Aware to provide a copy of the vaccination  record if obtained from local pharmacy or Health Dept. Verbalized acceptance and understanding.  Flu Vaccine status: Declined, Education has been provided regarding the importance of this vaccine but patient still declined. Advised may receive this vaccine at local pharmacy or Health Dept. Aware to provide a copy of the vaccination record if obtained from local pharmacy or Health Dept. Verbalized acceptance and understanding.  Pneumococcal vaccine status: Declined,  Education has been provided regarding the importance of this vaccine but patient still declined. Advised may receive this vaccine at local pharmacy or Health Dept. Aware to provide a copy of the vaccination record if obtained from local pharmacy or Health Dept. Verbalized acceptance and understanding.   Covid-19 vaccine status: Declined, Education has been provided regarding the importance of this vaccine but patient still declined. Advised may receive this vaccine at local pharmacy or Health Dept.or vaccine clinic. Aware to provide a copy of the vaccination record if obtained from local pharmacy or Health Dept. Verbalized acceptance and understanding.  Qualifies for Shingles Vaccine? Yes   Zostavax completed No   Shingrix Completed?: No.    Education has been provided regarding the importance of this vaccine. Patient has been advised to call insurance company to determine out of pocket expense if they have not yet received this vaccine. Advised may also receive vaccine at local pharmacy or Health Dept. Verbalized acceptance and understanding.  Screening Tests Health Maintenance  Topic Date Due   COVID-19 Vaccine (1) Never done   Zoster Vaccines- Shingrix (1 of 2) Never done   TETANUS/TDAP  04/25/2016   Pneumonia Vaccine 10+ Years old (1 - PCV) Never done   INFLUENZA VACCINE  08/29/2021   Fecal DNA (Cologuard)  09/25/2021   Medicare Annual Wellness (AWV)  10/31/2021   Hepatitis C Screening  Completed   HPV VACCINES  Aged Out    COLONOSCOPY (Pts 45-35yr Insurance coverage will need to be confirmed)  Discontinued    Health Maintenance  Health Maintenance Due  Topic Date Due   COVID-19 Vaccine (1) Never done   Zoster Vaccines- Shingrix (1 of 2) Never done   TETANUS/TDAP  04/25/2016   Pneumonia Vaccine 67 Years old (1 - PCV) Never done   INFLUENZA VACCINE  08/29/2021   Fecal DNA (Cologuard)  09/25/2021   Medicare Annual Wellness (AWV)  10/31/2021    Colorectal cancer screening: declines   Lung Cancer Screening: (Low Dose CT Chest recommended if Age 67-80years, 30 pack-year currently smoking OR have quit w/in 15years.) does not qualify.   Lung Cancer Screening Referral: no  Additional Screening:  Hepatitis C Screening: does qualify; Completed 01/05/2015  Vision Screening: Recommended annual ophthalmology exams for early detection of glaucoma and other disorders of the eye. Is the patient up to date with their annual eye exam?  No  Who is the provider or what is the name of the office in which the patient attends annual eye exams? none If pt is not established with a provider, would they like to be referred to a provider to establish care? No .   Dental Screening: Recommended annual dental exams for proper oral hygiene  Community Resource Referral / Chronic Care Management: CRR required this visit?  No   CCM required  this visit?  No      Plan:     I have personally reviewed and noted the following in the patient's chart:   Medical and social history Use of alcohol, tobacco or illicit drugs  Current medications and supplements including opioid prescriptions. Patient is not currently taking opioid prescriptions. Functional ability and status Nutritional status Physical activity Advanced directives List of other physicians Hospitalizations, surgeries, and ER visits in previous 12 months Vitals Screenings to include cognitive, depression, and falls Referrals and appointments  In addition, I  have reviewed and discussed with patient certain preventive protocols, quality metrics, and best practice recommendations. A written personalized care plan for preventive services as well as general preventive health recommendations were provided to patient.     Kellie Simmering, LPN   [id number]   Nurse Notes: 6 CIT not administered. Patient has aphasia. Patient appeared cognitive via video.  Due to this being a virtual visit, the after visit summary with patients personalized plan was offered to patient via mail or my-chart.  to pick up at office at next visit

## 2021-12-01 NOTE — Patient Instructions (Signed)
Description   Spoke with wife and advised pt to continue taking 1 tablet (5 mg) daily except 1/2 tablet (2.5 mg) each Monday, Wednesday and Friday. Repeat INR in 4 weeks at [facility] (has standing order). [phone]

## 2021-12-07 DIAGNOSIS — Z79899 Other long term (current) drug therapy: Secondary | ICD-10-CM | POA: Diagnosis not present

## 2021-12-07 DIAGNOSIS — M25562 Pain in left knee: Secondary | ICD-10-CM | POA: Diagnosis not present

## 2021-12-07 DIAGNOSIS — M25561 Pain in right knee: Secondary | ICD-10-CM | POA: Diagnosis not present

## 2021-12-07 DIAGNOSIS — M47817 Spondylosis without myelopathy or radiculopathy, lumbosacral region: Secondary | ICD-10-CM | POA: Diagnosis not present

## 2021-12-07 DIAGNOSIS — M545 Low back pain, unspecified: Secondary | ICD-10-CM | POA: Diagnosis not present

## 2021-12-07 DIAGNOSIS — M25532 Pain in left wrist: Secondary | ICD-10-CM | POA: Diagnosis not present

## 2021-12-07 DIAGNOSIS — G8929 Other chronic pain: Secondary | ICD-10-CM | POA: Diagnosis not present

## 2021-12-07 DIAGNOSIS — M5136 Other intervertebral disc degeneration, lumbar region: Secondary | ICD-10-CM | POA: Diagnosis not present

## 2021-12-07 DIAGNOSIS — Z79891 Long term (current) use of opiate analgesic: Secondary | ICD-10-CM | POA: Diagnosis not present

## 2021-12-07 DIAGNOSIS — G894 Chronic pain syndrome: Secondary | ICD-10-CM | POA: Diagnosis not present

## 2021-12-20 DIAGNOSIS — M25561 Pain in right knee: Secondary | ICD-10-CM | POA: Diagnosis not present

## 2021-12-29 DIAGNOSIS — I4891 Unspecified atrial fibrillation: Secondary | ICD-10-CM | POA: Diagnosis not present

## 2021-12-30 LAB — PROTIME-INR
INR: 2.5 — ABNORMAL HIGH (ref 0.9–1.2)
Prothrombin Time: 25.6 s — ABNORMAL HIGH (ref 9.1–12.0)

## 2022-01-01 ENCOUNTER — Ambulatory Visit (INDEPENDENT_AMBULATORY_CARE_PROVIDER_SITE_OTHER): Payer: Medicare HMO | Admitting: Cardiology

## 2022-01-01 DIAGNOSIS — Z7901 Long term (current) use of anticoagulants: Secondary | ICD-10-CM

## 2022-01-01 DIAGNOSIS — Z8673 Personal history of transient ischemic attack (TIA), and cerebral infarction without residual deficits: Secondary | ICD-10-CM

## 2022-01-01 DIAGNOSIS — I4891 Unspecified atrial fibrillation: Secondary | ICD-10-CM

## 2022-01-01 NOTE — Patient Instructions (Signed)
Description   Spoke with wife and advised pt to continue taking 1 tablet (5 mg) daily except 1/2 tablet (2.5 mg) each Monday, Wednesday and Friday.  Repeat INR in 5 weeks at [facility] (has standing order). [phone]

## 2022-01-02 DIAGNOSIS — Z79899 Other long term (current) drug therapy: Secondary | ICD-10-CM | POA: Diagnosis not present

## 2022-01-02 DIAGNOSIS — G8929 Other chronic pain: Secondary | ICD-10-CM | POA: Diagnosis not present

## 2022-01-02 DIAGNOSIS — Z79891 Long term (current) use of opiate analgesic: Secondary | ICD-10-CM | POA: Diagnosis not present

## 2022-01-02 DIAGNOSIS — M5136 Other intervertebral disc degeneration, lumbar region: Secondary | ICD-10-CM | POA: Diagnosis not present

## 2022-01-02 DIAGNOSIS — M545 Low back pain, unspecified: Secondary | ICD-10-CM | POA: Diagnosis not present

## 2022-01-02 DIAGNOSIS — M47817 Spondylosis without myelopathy or radiculopathy, lumbosacral region: Secondary | ICD-10-CM | POA: Diagnosis not present

## 2022-01-02 DIAGNOSIS — G894 Chronic pain syndrome: Secondary | ICD-10-CM | POA: Diagnosis not present

## 2022-01-02 DIAGNOSIS — M25562 Pain in left knee: Secondary | ICD-10-CM | POA: Diagnosis not present

## 2022-01-02 DIAGNOSIS — M25532 Pain in left wrist: Secondary | ICD-10-CM | POA: Diagnosis not present

## 2022-01-02 DIAGNOSIS — M25561 Pain in right knee: Secondary | ICD-10-CM | POA: Diagnosis not present

## 2022-01-16 ENCOUNTER — Other Ambulatory Visit: Payer: Self-pay | Admitting: Family Medicine

## 2022-01-16 DIAGNOSIS — I428 Other cardiomyopathies: Secondary | ICD-10-CM

## 2022-01-26 ENCOUNTER — Other Ambulatory Visit: Payer: Self-pay | Admitting: Family Medicine

## 2022-01-26 ENCOUNTER — Other Ambulatory Visit: Payer: Self-pay | Admitting: Cardiovascular Disease

## 2022-01-26 DIAGNOSIS — E039 Hypothyroidism, unspecified: Secondary | ICD-10-CM

## 2022-01-26 DIAGNOSIS — I10 Essential (primary) hypertension: Secondary | ICD-10-CM

## 2022-01-26 DIAGNOSIS — I428 Other cardiomyopathies: Secondary | ICD-10-CM

## 2022-01-26 DIAGNOSIS — K219 Gastro-esophageal reflux disease without esophagitis: Secondary | ICD-10-CM

## 2022-01-26 DIAGNOSIS — I4821 Permanent atrial fibrillation: Secondary | ICD-10-CM

## 2022-01-30 DIAGNOSIS — M545 Low back pain, unspecified: Secondary | ICD-10-CM | POA: Diagnosis not present

## 2022-01-30 DIAGNOSIS — G894 Chronic pain syndrome: Secondary | ICD-10-CM | POA: Diagnosis not present

## 2022-01-30 DIAGNOSIS — G8929 Other chronic pain: Secondary | ICD-10-CM | POA: Diagnosis not present

## 2022-01-30 DIAGNOSIS — M25561 Pain in right knee: Secondary | ICD-10-CM | POA: Diagnosis not present

## 2022-01-30 DIAGNOSIS — M25532 Pain in left wrist: Secondary | ICD-10-CM | POA: Diagnosis not present

## 2022-01-30 DIAGNOSIS — M5136 Other intervertebral disc degeneration, lumbar region: Secondary | ICD-10-CM | POA: Diagnosis not present

## 2022-01-30 DIAGNOSIS — M47817 Spondylosis without myelopathy or radiculopathy, lumbosacral region: Secondary | ICD-10-CM | POA: Diagnosis not present

## 2022-01-30 DIAGNOSIS — Z79899 Other long term (current) drug therapy: Secondary | ICD-10-CM | POA: Diagnosis not present

## 2022-01-30 DIAGNOSIS — Z79891 Long term (current) use of opiate analgesic: Secondary | ICD-10-CM | POA: Diagnosis not present

## 2022-01-30 DIAGNOSIS — M25562 Pain in left knee: Secondary | ICD-10-CM | POA: Diagnosis not present

## 2022-02-02 ENCOUNTER — Other Ambulatory Visit: Payer: Self-pay

## 2022-02-02 DIAGNOSIS — I4891 Unspecified atrial fibrillation: Secondary | ICD-10-CM | POA: Diagnosis not present

## 2022-02-02 NOTE — Telephone Encounter (Signed)
Reminded patient to go to lab for INR draw.

## 2022-02-03 LAB — PROTIME-INR
INR: 2.3 — ABNORMAL HIGH (ref 0.9–1.2)
Prothrombin Time: 23.2 s — ABNORMAL HIGH (ref 9.1–12.0)

## 2022-02-05 ENCOUNTER — Telehealth: Payer: Self-pay

## 2022-02-05 ENCOUNTER — Ambulatory Visit (INDEPENDENT_AMBULATORY_CARE_PROVIDER_SITE_OTHER): Payer: Medicare HMO | Admitting: Cardiology

## 2022-02-05 DIAGNOSIS — E785 Hyperlipidemia, unspecified: Secondary | ICD-10-CM | POA: Diagnosis not present

## 2022-02-05 DIAGNOSIS — I4891 Unspecified atrial fibrillation: Secondary | ICD-10-CM | POA: Diagnosis not present

## 2022-02-05 DIAGNOSIS — Z7901 Long term (current) use of anticoagulants: Secondary | ICD-10-CM | POA: Diagnosis not present

## 2022-02-05 DIAGNOSIS — Z8673 Personal history of transient ischemic attack (TIA), and cerebral infarction without residual deficits: Secondary | ICD-10-CM

## 2022-02-05 DIAGNOSIS — I10 Essential (primary) hypertension: Secondary | ICD-10-CM | POA: Diagnosis not present

## 2022-02-05 DIAGNOSIS — M1990 Unspecified osteoarthritis, unspecified site: Secondary | ICD-10-CM | POA: Diagnosis not present

## 2022-02-05 DIAGNOSIS — Z5181 Encounter for therapeutic drug level monitoring: Secondary | ICD-10-CM | POA: Diagnosis not present

## 2022-02-05 DIAGNOSIS — Z79891 Long term (current) use of opiate analgesic: Secondary | ICD-10-CM | POA: Diagnosis not present

## 2022-02-05 DIAGNOSIS — E039 Hypothyroidism, unspecified: Secondary | ICD-10-CM | POA: Diagnosis not present

## 2022-02-05 DIAGNOSIS — Z008 Encounter for other general examination: Secondary | ICD-10-CM | POA: Diagnosis not present

## 2022-02-05 DIAGNOSIS — I69351 Hemiplegia and hemiparesis following cerebral infarction affecting right dominant side: Secondary | ICD-10-CM | POA: Diagnosis not present

## 2022-02-05 DIAGNOSIS — D6869 Other thrombophilia: Secondary | ICD-10-CM | POA: Diagnosis not present

## 2022-02-05 DIAGNOSIS — Z8249 Family history of ischemic heart disease and other diseases of the circulatory system: Secondary | ICD-10-CM | POA: Diagnosis not present

## 2022-02-05 DIAGNOSIS — K219 Gastro-esophageal reflux disease without esophagitis: Secondary | ICD-10-CM | POA: Diagnosis not present

## 2022-02-05 DIAGNOSIS — J309 Allergic rhinitis, unspecified: Secondary | ICD-10-CM | POA: Diagnosis not present

## 2022-02-05 DIAGNOSIS — G3184 Mild cognitive impairment, so stated: Secondary | ICD-10-CM | POA: Diagnosis not present

## 2022-02-05 NOTE — Telephone Encounter (Signed)
Lp's wife message to call back and discuss INR result.

## 2022-03-12 DIAGNOSIS — Z79899 Other long term (current) drug therapy: Secondary | ICD-10-CM | POA: Diagnosis not present

## 2022-03-12 DIAGNOSIS — G894 Chronic pain syndrome: Secondary | ICD-10-CM | POA: Diagnosis not present

## 2022-03-12 DIAGNOSIS — M25532 Pain in left wrist: Secondary | ICD-10-CM | POA: Diagnosis not present

## 2022-03-12 DIAGNOSIS — M25562 Pain in left knee: Secondary | ICD-10-CM | POA: Diagnosis not present

## 2022-03-12 DIAGNOSIS — M545 Low back pain, unspecified: Secondary | ICD-10-CM | POA: Diagnosis not present

## 2022-03-12 DIAGNOSIS — M5136 Other intervertebral disc degeneration, lumbar region: Secondary | ICD-10-CM | POA: Diagnosis not present

## 2022-03-12 DIAGNOSIS — Z79891 Long term (current) use of opiate analgesic: Secondary | ICD-10-CM | POA: Diagnosis not present

## 2022-03-12 DIAGNOSIS — G8929 Other chronic pain: Secondary | ICD-10-CM | POA: Diagnosis not present

## 2022-03-12 DIAGNOSIS — M47817 Spondylosis without myelopathy or radiculopathy, lumbosacral region: Secondary | ICD-10-CM | POA: Diagnosis not present

## 2022-03-12 DIAGNOSIS — M25561 Pain in right knee: Secondary | ICD-10-CM | POA: Diagnosis not present

## 2022-03-13 DIAGNOSIS — I4891 Unspecified atrial fibrillation: Secondary | ICD-10-CM | POA: Diagnosis not present

## 2022-03-14 ENCOUNTER — Ambulatory Visit (INDEPENDENT_AMBULATORY_CARE_PROVIDER_SITE_OTHER): Payer: Medicare HMO | Admitting: *Deleted

## 2022-03-14 DIAGNOSIS — Z8673 Personal history of transient ischemic attack (TIA), and cerebral infarction without residual deficits: Secondary | ICD-10-CM

## 2022-03-14 DIAGNOSIS — I4821 Permanent atrial fibrillation: Secondary | ICD-10-CM | POA: Diagnosis not present

## 2022-03-14 DIAGNOSIS — Z7901 Long term (current) use of anticoagulants: Secondary | ICD-10-CM

## 2022-03-14 LAB — PROTIME-INR
INR: 2.4 — ABNORMAL HIGH (ref 0.9–1.2)
Prothrombin Time: 24.9 s — ABNORMAL HIGH (ref 9.1–12.0)

## 2022-03-14 NOTE — Patient Instructions (Addendum)
Description   Spoke with wife and advised pt to continue taking warfarin 1 tablet (5 mg) daily except 1/2 tablet (2.5 mg) each Monday, Wednesday and Friday.  Repeat INR in 6 weeks at [facility] (has standing order). [phone]

## 2022-03-25 ENCOUNTER — Other Ambulatory Visit: Payer: Self-pay | Admitting: Cardiovascular Disease

## 2022-03-25 ENCOUNTER — Other Ambulatory Visit: Payer: Self-pay | Admitting: Family Medicine

## 2022-03-25 DIAGNOSIS — I4821 Permanent atrial fibrillation: Secondary | ICD-10-CM

## 2022-03-25 DIAGNOSIS — I10 Essential (primary) hypertension: Secondary | ICD-10-CM

## 2022-03-25 DIAGNOSIS — E7800 Pure hypercholesterolemia, unspecified: Secondary | ICD-10-CM

## 2022-03-25 DIAGNOSIS — K219 Gastro-esophageal reflux disease without esophagitis: Secondary | ICD-10-CM

## 2022-03-25 DIAGNOSIS — E782 Mixed hyperlipidemia: Secondary | ICD-10-CM

## 2022-03-25 DIAGNOSIS — I428 Other cardiomyopathies: Secondary | ICD-10-CM

## 2022-03-25 DIAGNOSIS — E039 Hypothyroidism, unspecified: Secondary | ICD-10-CM

## 2022-03-26 NOTE — Telephone Encounter (Signed)
Prescription refill request received for warfarin [place]: 11/22/2021 Next INR check: 3/26 Warfarin tablet strength: '5mg'$ 

## 2022-04-05 ENCOUNTER — Telehealth: Payer: Self-pay | Admitting: Family Medicine

## 2022-04-05 NOTE — Telephone Encounter (Signed)
Called and left VM that pt needs a appt per dr [employer] form in folder

## 2022-04-11 DIAGNOSIS — G8929 Other chronic pain: Secondary | ICD-10-CM | POA: Diagnosis not present

## 2022-04-11 DIAGNOSIS — G894 Chronic pain syndrome: Secondary | ICD-10-CM | POA: Diagnosis not present

## 2022-04-11 DIAGNOSIS — M545 Low back pain, unspecified: Secondary | ICD-10-CM | POA: Diagnosis not present

## 2022-04-11 DIAGNOSIS — M47817 Spondylosis without myelopathy or radiculopathy, lumbosacral region: Secondary | ICD-10-CM | POA: Diagnosis not present

## 2022-04-11 DIAGNOSIS — M25532 Pain in left wrist: Secondary | ICD-10-CM | POA: Diagnosis not present

## 2022-04-11 DIAGNOSIS — M25562 Pain in left knee: Secondary | ICD-10-CM | POA: Diagnosis not present

## 2022-04-11 DIAGNOSIS — Z79891 Long term (current) use of opiate analgesic: Secondary | ICD-10-CM | POA: Diagnosis not present

## 2022-04-11 DIAGNOSIS — M25561 Pain in right knee: Secondary | ICD-10-CM | POA: Diagnosis not present

## 2022-04-11 DIAGNOSIS — Z79899 Other long term (current) drug therapy: Secondary | ICD-10-CM | POA: Diagnosis not present

## 2022-04-11 DIAGNOSIS — M5136 Other intervertebral disc degeneration, lumbar region: Secondary | ICD-10-CM | POA: Diagnosis not present

## 2022-04-15 ENCOUNTER — Other Ambulatory Visit: Payer: Self-pay | Admitting: Family Medicine

## 2022-04-15 DIAGNOSIS — I428 Other cardiomyopathies: Secondary | ICD-10-CM

## 2022-04-25 ENCOUNTER — Telehealth: Payer: Self-pay | Admitting: Cardiovascular Disease

## 2022-04-25 DIAGNOSIS — Z7901 Long term (current) use of anticoagulants: Secondary | ICD-10-CM

## 2022-04-25 NOTE — Telephone Encounter (Addendum)
PT/INR ordered  Eric Ross from [place] aware

## 2022-04-25 NOTE — Addendum Note (Signed)
Addended by: Moishe Spice on: 04/25/2022 03:43 PM   Modules accepted: Orders

## 2022-04-25 NOTE — Telephone Encounter (Signed)
Tonya with [facility] states they take patient's INR monthly. However, this month there is no order in the system. She is requesting to have INR order placed as soon as possible. She states the patient showed up there for labs and he was very upset because he goes monthly. She states they went ahead and drew blood, but they will have to discard it within the next hour if they do not have an order in the system. Tonya requested a call back to confirm when order is placed.  Phone#: [phone] Fax#: [phone]

## 2022-04-26 ENCOUNTER — Ambulatory Visit (INDEPENDENT_AMBULATORY_CARE_PROVIDER_SITE_OTHER): Payer: Medicare HMO

## 2022-04-26 DIAGNOSIS — Z5181 Encounter for therapeutic drug level monitoring: Secondary | ICD-10-CM

## 2022-04-26 LAB — PROTIME-INR
INR: 2.5 — ABNORMAL HIGH (ref 0.9–1.2)
Prothrombin Time: 25.6 s — ABNORMAL HIGH (ref 9.1–12.0)

## 2022-04-26 NOTE — Patient Instructions (Signed)
Description   Spoke with wife and advised pt to continue taking warfarin 1 tablet (5 mg) daily except 1/2 tablet (2.5 mg) each Monday, Wednesday and Friday.  Repeat INR in 6 weeks at [facility] (has standing order). [phone]      

## 2022-05-07 DIAGNOSIS — G8929 Other chronic pain: Secondary | ICD-10-CM | POA: Diagnosis not present

## 2022-05-07 DIAGNOSIS — M25562 Pain in left knee: Secondary | ICD-10-CM | POA: Diagnosis not present

## 2022-05-07 DIAGNOSIS — M5136 Other intervertebral disc degeneration, lumbar region: Secondary | ICD-10-CM | POA: Diagnosis not present

## 2022-05-07 DIAGNOSIS — Z79891 Long term (current) use of opiate analgesic: Secondary | ICD-10-CM | POA: Diagnosis not present

## 2022-05-07 DIAGNOSIS — M25532 Pain in left wrist: Secondary | ICD-10-CM | POA: Diagnosis not present

## 2022-05-07 DIAGNOSIS — M47817 Spondylosis without myelopathy or radiculopathy, lumbosacral region: Secondary | ICD-10-CM | POA: Diagnosis not present

## 2022-05-07 DIAGNOSIS — G894 Chronic pain syndrome: Secondary | ICD-10-CM | POA: Diagnosis not present

## 2022-05-07 DIAGNOSIS — Z79899 Other long term (current) drug therapy: Secondary | ICD-10-CM | POA: Diagnosis not present

## 2022-05-07 DIAGNOSIS — M25561 Pain in right knee: Secondary | ICD-10-CM | POA: Diagnosis not present

## 2022-05-07 DIAGNOSIS — M545 Low back pain, unspecified: Secondary | ICD-10-CM | POA: Diagnosis not present

## 2022-05-10 ENCOUNTER — Ambulatory Visit
Admission: EM | Admit: 2022-05-10 | Discharge: 2022-05-10 | Disposition: A | Discharge status: Home / Self Care | Payer: Medicare HMO

## 2022-05-10 ENCOUNTER — Encounter: Payer: Self-pay | Admitting: Emergency Medicine

## 2022-05-10 DIAGNOSIS — J309 Allergic rhinitis, unspecified: Secondary | ICD-10-CM

## 2022-05-10 DIAGNOSIS — J40 Bronchitis, not specified as acute or chronic: Secondary | ICD-10-CM

## 2022-05-10 DIAGNOSIS — R059 Cough, unspecified: Secondary | ICD-10-CM | POA: Diagnosis not present

## 2022-05-10 MED ORDER — HYDROCODONE BIT-HOMATROP MBR 5-1.5 MG/5ML PO SOLN: 5.0000 mL | Freq: Four times a day (QID) | ORAL | 0 refills | Status: DC | PRN

## 2022-05-10 MED ORDER — BENZONATATE 200 MG PO CAPS: 200.0000 mg | ORAL_CAPSULE | Freq: Three times a day (TID) | ORAL | 0 refills | Status: AC | PRN

## 2022-05-10 MED ORDER — DOXYCYCLINE HYCLATE 100 MG PO CAPS: 100.0000 mg | ORAL_CAPSULE | Freq: Two times a day (BID) | ORAL | 0 refills | Status: AC

## 2022-05-10 MED ORDER — PREDNISONE 10 MG (21) PO TBPK: ORAL_TABLET | Freq: Every day | ORAL | 0 refills | Status: DC

## 2022-05-10 MED ORDER — FEXOFENADINE HCL 180 MG PO TABS: 180.0000 mg | ORAL_TABLET | Freq: Every day | ORAL | 0 refills | Status: DC

## 2022-05-10 NOTE — Discharge Instructions (Addendum)
Instructed patient/wife to take medications as directed with food to completion.  Advised patient to take Allegra and prednisone with first dose of doxycycline for the next 10 days.  Advised may discontinue Allegra after 5 days and use as needed for concurrent postnasal drainage/drip.  Advised may use Tessalon daily or as needed for cough.  Advised may use Hycodan at night prior to sleep for cough due to sedative effects.  Encouraged patient to increase daily water intake to 64 ounces per day while taking these medications.  Advised if symptoms worsen and/or unresolved please follow-up with PCP or here for further evaluation.

## 2022-05-10 NOTE — ED Triage Notes (Signed)
Patient c/o chest congestion, cough and sore throat x 1 week.  Patient has taken Alka Seltzer Cold & Flu.

## 2022-05-10 NOTE — ED Provider Notes (Signed)
[facility]    CSN: [id number] Arrival date & time: 05/10/22  0901      History   Chief Complaint Chief Complaint  Patient presents with   Cough    HPI Eric Ross is a 67 y.o. male.   HPI Pleasant 67 year old male presents with chest congestion, cough, and sore throat for 1 week.  PMH significant for nonischemic cardiomyopathy, A-fib and hemiparesis affecting right side as late effect of stroke. Patient is accompanied by his wife this morning.  Patient is currently on Coumadin and denies any unusual bleeding.  Past Medical History:  Diagnosis Date   Atrial fibrillation    Dr. Allyson SabalBerry   Cardiomyopathy    nonischemic   Dyspnea    Erectile dysfunction    GERD (gastroesophageal reflux disease)    Headache    History of kidney stones    History of thyroid cancer    Hyperlipidemia    Hypothyroidism    Pseudogout    Stroke 01/29/2001   Thyroid cancer     Patient Active Problem List   Diagnosis Date Noted   S/P TKR (total knee replacement) using cement, right 06/19/2021   Osteoarthritis of right knee 06/15/2021   Immunization refused 10/31/2020   Left wrist pain 02/16/2020   Complex regional pain syndrome type 1 of upper extremity 02/16/2020   Lumbosacral spondylosis 02/16/2020   Lumbar degenerative disc disease 02/16/2020   High risk medications (not anticoagulants) long-term use 02/16/2020   Long-term current use of opiate analgesic 02/16/2020   Chronic pain syndrome 02/16/2020   Chronic low back pain 10/30/2019   Nonischemic cardiomyopathy 02/05/2018   Hemiparesis affecting right side as late effect of stroke 02/15/2017   Expressive aphasia 02/15/2017   Erectile dysfunction 04/25/2016   Hypothyroidism 04/25/2016   Gastroesophageal reflux disease 04/25/2016   Mixed hyperlipidemia 11/03/2014   Atrial fibrillation 04/23/2012   Long term current use of anticoagulant therapy 04/23/2012   History of CVA (cerebrovascular accident) 04/23/2012    Allergic rhinitis 12/14/2010   Essential hypertension 07/05/2008    Past Surgical History:  Procedure Laterality Date   CARDIAC CATHETERIZATION  09/22/2001   NORMAL CORONARY ARTERIES   JOINT REPLACEMENT     BILATERAL KNEE    KNEE SURGERY Bilateral    LEXISCAN MYOCARDIAL PERFUSION STUDY  05/30/2011   NORMAL MYOCARDIAL PERFUSION STUDY EF% 49%.   THYROIDECTOMY     TOTAL KNEE ARTHROPLASTY Right 06/19/2021   Procedure: RIGHT TOTAL KNEE ARTHROPLASTY;  Surgeon: Gean Birchwoodowan, Frank, MD;  Location: [facility];  Service: Orthopedics;  Laterality: Right;   TRANSTHORACIC ECHOCARDIOGRAM  05/30/2011   EF%=45-50%. NORMAL LV WALL THICKNESS. RV IS MILDLY DILATED. NO SIGNIFICANT VALVE DISEASE.        Home Medications    Prior to Admission medications   Medication Sig Start Date End Date Taking? Authorizing Provider  acetaminophen (TYLENOL) 500 MG tablet Take 500-1,000 mg by mouth every 6 (six) hours as needed (headaches.).   Yes [provider]  Aspirin-Salicylamide-Caffeine (BC FAST PAIN RELIEF) 650-195-33.3 MG PACK Take 1 packet by mouth daily as needed (pain.).   Yes [provider]  benzonatate (TESSALON) 200 MG capsule Take 1 capsule (200 mg total) by mouth 3 (three) times daily as needed for up to 7 days. 05/10/22 05/17/22 Yes Trevor Ihaagan, Brittan Butterbaugh, FNP  carvedilol (COREG) 25 MG tablet TAKE 1.5 TABLETS (37.5 MG TOTAL) BY MOUTH TWICE A DAY 04/16/22  Yes Ronnald NianLalonde, John C, MD  cetirizine (ZYRTEC) 10 MG tablet Take 10 mg by mouth  daily as needed for allergies.   Yes [provider]  diclofenac Sodium (VOLTAREN) 1 % GEL Apply 4 g topically 2 (two) times daily as needed (pain.). 05/16/21  Yes [provider]  doxycycline (VIBRAMYCIN) 100 MG capsule Take 1 capsule (100 mg total) by mouth 2 (two) times daily for 10 days. 05/10/22 05/20/22 Yes Trevor Iha, FNP  enoxaparin (LOVENOX) 80 MG/0.8ML injection Inject 0.8 mLs (80 mg total) into the skin every 12 (twelve) hours. 06/06/21  Yes Runell Gess, MD  fexofenadine [facility] ALLERGY) 180 MG tablet Take 1 tablet (180 mg total) by mouth daily for 15 days. 05/10/22 05/25/22 Yes Trevor Iha, FNP  gabapentin (NEURONTIN) 300 MG capsule SMARTSIG:1 Capsule(s) By Mouth Every Evening 05/04/22  Yes [provider]  HYDROcodone bit-homatropine (HYCODAN) 5-1.5 MG/5ML syrup Take 5 mLs by mouth every 6 (six) hours as needed for cough. 05/10/22  Yes Trevor Iha, FNP  levothyroxine (SYNTHROID) 112 MCG tablet TAKE 1 TABLET BY MOUTH EVERY DAY 03/26/22  Yes Ronnald Nian, MD  oxyCODONE-acetaminophen (PERCOCET/ROXICET) 5-325 MG tablet Take 1 tablet by mouth every 4 (four) hours as needed for severe pain. 06/19/21  Yes Dannielle Burn K, PA-C  pantoprazole (PROTONIX) 40 MG tablet TAKE 1 TABLET BY MOUTH EVERY DAY 03/26/22  Yes Ronnald Nian, MD  predniSONE (STERAPRED UNI-PAK 21 TAB) 10 MG (21) TBPK tablet Take by mouth daily. Take 6 tabs by mouth daily  for 2 days, then 5 tabs for 2 days, then 4 tabs for 2 days, then 3 tabs for 2 days, 2 tabs for 2 days, then 1 tab by mouth daily for 2 days 05/10/22  Yes Trevor Iha, FNP  ramipril (ALTACE) 2.5 MG capsule TAKE 1 CAPSULE BY MOUTH EVERY DAY 03/26/22  Yes Ronnald Nian, MD  simvastatin (ZOCOR) 20 MG tablet TAKE 1 TABLET BY MOUTH EVERY DAY 03/26/22  Yes Ronnald Nian, MD  tadalafil (CIALIS) 20 MG tablet TAKE 1 TABLET BY MOUTH ONCE DAILY AS NEEDED FOR ERECTILE DYSFUNCTION 11/21/21  Yes Ronnald Nian, MD  tiZANidine (ZANAFLEX) 2 MG tablet Take 1 tablet (2 mg total) by mouth every 6 (six) hours as needed. 06/19/21  Yes Allena Katz, PA-C  warfarin (COUMADIN) 5 MG tablet TAKE 1/2 TO 1 TABLET DAILY OR AS PRESCRIBED BY COUMADIN CLINIC 03/26/22  Yes Runell Gess, MD    Family History Family History  Problem Relation Age of Onset   Thyroid cancer Mother    Cancer Sister    Stroke Brother     Social History Social History   Tobacco Use   Smoking status: Never   Smokeless tobacco:  Current    Types: Chew  Vaping Use   Vaping Use: Never used  Substance Use Topics   Alcohol use: No   Drug use: No     Allergies   Penicillins   Review of Systems Review of Systems  HENT:  Positive for congestion and sore throat.   Respiratory:  Positive for cough.   All other systems reviewed and are negative.    Physical Exam Triage Vital Signs ED Triage Vitals  Enc Vitals Group     BP 05/10/22 0918 108/70     Pulse Rate 05/10/22 0918 69     Resp 05/10/22 0918 16     Temp 05/10/22 0918 98.3 F (36.8 C)     Temp Source 05/10/22 0918 Oral     SpO2 05/10/22 0918 100 %     Weight 05/10/22  0920 180 lb (81.6 kg)     Height 05/10/22 0920  (1.854 m)     Head Circumference --      Peak Flow --      Pain Score 05/10/22 0920 0     Pain Loc --      Pain Edu? --      Excl. in GC? --    No data found.  Updated Vital Signs BP 108/70 (BP Location: Left Arm)   Pulse 69   Temp 98.3 F (36.8 C) (Oral)   Resp 16   Ht  (1.854 m)   Wt 180 lb (81.6 kg)   SpO2 100%   BMI 23.75 kg/m   Physical Exam Vitals and nursing note reviewed.  Constitutional:      Appearance: Normal appearance. He is normal weight. He is ill-appearing.  HENT:     Head: Normocephalic and atraumatic.     Right Ear: Tympanic membrane, ear canal and external ear normal.     Left Ear: Tympanic membrane, ear canal and external ear normal.     Mouth/Throat:     Mouth: Mucous membranes are moist.     Pharynx: Oropharynx is clear.     Comments: Significant amount of clear drainage of posterior oropharynx noted Eyes:     Extraocular Movements: Extraocular movements intact.     Conjunctiva/sclera: Conjunctivae normal.     Pupils: Pupils are equal, round, and reactive to light.  Cardiovascular:     Pulses: Normal pulses.     Heart sounds: Normal heart sounds.     Comments: Irregularly irregular Pulmonary:     Effort: Pulmonary effort is normal.     Breath sounds: Rhonchi present. No wheezing  or rales.     Comments: Diffuse scattered rhonchi noted throughout, with frequent nonproductive cough on exam Musculoskeletal:        General: Normal range of motion.     Cervical back: Normal range of motion and neck supple.  Skin:    General: Skin is warm and dry.  Neurological:     General: No focal deficit present.     Mental Status: He is alert and oriented to person, place, and time.      UC Treatments / Results  Labs (all labs ordered are listed, but only abnormal results are displayed) Labs Reviewed - No data to display  EKG   Radiology No results found.  Procedures Procedures (including critical care time)  Medications Ordered in UC Medications - No data to display  Initial Impression / Assessment and Plan / UC Course  I have reviewed the triage vital signs and the nursing notes.  Pertinent labs & imaging results that were available during my care of the patient were reviewed by me and considered in my medical decision making (see chart for details).     MDM: 1.  Bronchitis-Rx'd Sterapred Unipak (tapering from 60 mg to 10 mg over the next 10 days); 2.  Cough Rx'd doxycycline 100 mg twice daily x 10 days, Tessalon 200 mg 3 times daily, as needed, Hycodan 5-1.5 mg/5 mL syrup-take 5 mL's every 6 hours for cough; 3.  Allergic rhinitis-Rx'd Allegra 180 mg daily x 5 days, then as needed instructed patient/wife to take medications as directed with food to completion.  Advised patient to take Allegra and prednisone with first dose of doxycycline for the next 10 days.  Advised may discontinue Allegra after 5 days and use as needed for concurrent postnasal drainage/drip.  Advised  may use Tessalon daily or as needed for cough.  Advised may use Hycodan at night prior to sleep for cough due to sedative effects.  Encouraged patient to increase daily water intake to 64 ounces per day while taking these medications.  Advised if symptoms worsen and/or unresolved please follow-up with  PCP or here for further evaluation.  Discharged home, hemodynamically stable. Final Clinical Impressions(s) / UC Diagnoses   Final diagnoses:  Cough, unspecified type  Bronchitis  Allergic rhinitis, unspecified seasonality, unspecified trigger     Discharge Instructions      Instructed patient/wife to take medications as directed with food to completion.  Advised patient to take Allegra and prednisone with first dose of doxycycline for the next 10 days.  Advised may discontinue Allegra after 5 days and use as needed for concurrent postnasal drainage/drip.  Advised may use Tessalon daily or as needed for cough.  Advised may use Hycodan at night prior to sleep for cough due to sedative effects.  Encouraged patient to increase daily water intake to 64 ounces per day while taking these medications.  Advised if symptoms worsen and/or unresolved please follow-up with PCP or here for further evaluation.     ED Prescriptions     Medication Sig Dispense Auth. Provider   doxycycline (VIBRAMYCIN) 100 MG capsule Take 1 capsule (100 mg total) by mouth 2 (two) times daily for 10 days. 20 capsule Trevor Iha, FNP   predniSONE (STERAPRED UNI-PAK 21 TAB) 10 MG (21) TBPK tablet Take by mouth daily. Take 6 tabs by mouth daily  for 2 days, then 5 tabs for 2 days, then 4 tabs for 2 days, then 3 tabs for 2 days, 2 tabs for 2 days, then 1 tab by mouth daily for 2 days 42 tablet Trevor Iha, FNP   fexofenadine [facility] ALLERGY) 180 MG tablet Take 1 tablet (180 mg total) by mouth daily for 15 days. 15 tablet Trevor Iha, FNP   benzonatate (TESSALON) 200 MG capsule Take 1 capsule (200 mg total) by mouth 3 (three) times daily as needed for up to 7 days. 40 capsule Trevor Iha, FNP   HYDROcodone bit-homatropine (HYCODAN) 5-1.5 MG/5ML syrup Take 5 mLs by mouth every 6 (six) hours as needed for cough. 120 mL Trevor Iha, FNP      I have reviewed the PDMP during this encounter.   Trevor Iha,  FNP 05/10/22 1010

## 2022-05-24 ENCOUNTER — Encounter: Payer: Medicare HMO | Admitting: Family Medicine

## 2022-05-29 ENCOUNTER — Ambulatory Visit (INDEPENDENT_AMBULATORY_CARE_PROVIDER_SITE_OTHER): Payer: Medicare HMO | Admitting: Family Medicine

## 2022-05-29 ENCOUNTER — Encounter: Payer: Self-pay | Admitting: Family Medicine

## 2022-05-29 VITALS — BP 120/82 | HR 84 | Temp 97.9°F | Resp 16 | Wt 173.0 lb

## 2022-05-29 DIAGNOSIS — N529 Male erectile dysfunction, unspecified: Secondary | ICD-10-CM

## 2022-05-29 DIAGNOSIS — E039 Hypothyroidism, unspecified: Secondary | ICD-10-CM

## 2022-05-29 DIAGNOSIS — G90519 Complex regional pain syndrome I of unspecified upper limb: Secondary | ICD-10-CM

## 2022-05-29 DIAGNOSIS — Z96651 Presence of right artificial knee joint: Secondary | ICD-10-CM

## 2022-05-29 DIAGNOSIS — Z79891 Long term (current) use of opiate analgesic: Secondary | ICD-10-CM | POA: Diagnosis not present

## 2022-05-29 DIAGNOSIS — I69351 Hemiplegia and hemiparesis following cerebral infarction affecting right dominant side: Secondary | ICD-10-CM | POA: Diagnosis not present

## 2022-05-29 DIAGNOSIS — I428 Other cardiomyopathies: Secondary | ICD-10-CM | POA: Diagnosis not present

## 2022-05-29 DIAGNOSIS — G894 Chronic pain syndrome: Secondary | ICD-10-CM

## 2022-05-29 DIAGNOSIS — Z7901 Long term (current) use of anticoagulants: Secondary | ICD-10-CM | POA: Diagnosis not present

## 2022-05-29 DIAGNOSIS — R4701 Aphasia: Secondary | ICD-10-CM | POA: Diagnosis not present

## 2022-05-29 DIAGNOSIS — K219 Gastro-esophageal reflux disease without esophagitis: Secondary | ICD-10-CM

## 2022-05-29 DIAGNOSIS — E782 Mixed hyperlipidemia: Secondary | ICD-10-CM

## 2022-05-29 DIAGNOSIS — E7800 Pure hypercholesterolemia, unspecified: Secondary | ICD-10-CM

## 2022-05-29 DIAGNOSIS — Z2821 Immunization not carried out because of patient refusal: Secondary | ICD-10-CM

## 2022-05-29 DIAGNOSIS — J309 Allergic rhinitis, unspecified: Secondary | ICD-10-CM

## 2022-05-29 DIAGNOSIS — I4821 Permanent atrial fibrillation: Secondary | ICD-10-CM

## 2022-05-29 DIAGNOSIS — I10 Essential (primary) hypertension: Secondary | ICD-10-CM | POA: Diagnosis not present

## 2022-05-29 DIAGNOSIS — Z1211 Encounter for screening for malignant neoplasm of colon: Secondary | ICD-10-CM

## 2022-05-29 LAB — COMPREHENSIVE METABOLIC PANEL

## 2022-05-29 LAB — CBC WITH DIFFERENTIAL/PLATELET
Hemoglobin: 13.1 g/dL (ref 13.0–17.7)
MCH: 28.7 pg (ref 26.6–33.0)
Monocytes: 7 %
Neutrophils Absolute: 4.6 10*3/uL (ref 1.4–7.0)
Neutrophils: 63 %
RDW: 13.4 % (ref 11.6–15.4)

## 2022-05-29 LAB — LIPID PANEL

## 2022-05-29 MED ORDER — LEVOTHYROXINE SODIUM 112 MCG PO TABS
112.0000 ug | ORAL_TABLET | Freq: Every day | ORAL | 0 refills | Status: DC
Start: 2022-05-29 — End: 2022-07-20

## 2022-05-29 MED ORDER — DICLOFENAC SODIUM 1 % EX GEL: 4.0000 g | Freq: Two times a day (BID) | CUTANEOUS | 1 refills | Status: AC | PRN

## 2022-05-29 MED ORDER — CARVEDILOL 25 MG PO TABS: ORAL_TABLET | ORAL | 3 refills | Status: DC

## 2022-05-29 MED ORDER — PANTOPRAZOLE SODIUM 40 MG PO TBEC: 40.0000 mg | DELAYED_RELEASE_TABLET | Freq: Every day | ORAL | 0 refills | Status: DC

## 2022-05-29 MED ORDER — TADALAFIL 20 MG PO TABS
20.0000 mg | ORAL_TABLET | Freq: Every day | ORAL | 5 refills | Status: DC | PRN
Start: 2022-05-29 — End: 2023-08-20

## 2022-05-29 MED ORDER — SIMVASTATIN 20 MG PO TABS: 20.0000 mg | ORAL_TABLET | Freq: Every day | ORAL | 3 refills | Status: DC

## 2022-05-29 MED ORDER — RAMIPRIL 2.5 MG PO CAPS: 2.5000 mg | ORAL_CAPSULE | Freq: Every day | ORAL | 3 refills | Status: DC

## 2022-05-29 NOTE — Progress Notes (Signed)
   Subjective:    Patient ID: Eric Ross, male    DOB: 09-25-1955, 67 y.o.   MRN: [id number]  HPI He is here for an interval evaluation.  He continues to have difficulty with right knee pain in spite of the TKR.  He is followed by Dr. Turner Daniels for that.  He does have underlying atrial fibs and is status post CVA secondary to that.  He does have an expressive aphasia.  He is also been taking care of for his chronic back pain.  He is followed at the pain clinic.  They are considering putting a stimulator in and I highly encouraged him to check on that.  His allergies seem to be under good control with Zyrtec.  He does use Protonix for his reflux.  He uses it on a daily basis.  He continues on Altace.  He is followed regularly by cardiology and at the Coumadin clinic.  He is also taking Synthroid for his hypothyroidism.  Continues on Zocor and having no difficulty with that.  He uses Cialis on an as-needed basis. Record was reviewed.  Review of Systems     Objective:   Physical Exam Alert and in no distress. Tympanic membranes and canals are normal. Pharyngeal area is normal. Neck is supple without adenopathy or thyromegaly. Cardiac exam shows an irregular rhythm without murmurs or gallops. Lungs are clear to auscultation.        Assessment & Plan:  Nonischemic cardiomyopathy (HCC) - Plan: carvedilol (COREG) 25 MG tablet, ramipril (ALTACE) 2.5 MG capsule  Essential hypertension - Plan: CBC with Differential/Platelet, Comprehensive metabolic panel, ramipril (ALTACE) 2.5 MG capsule  Permanent atrial fibrillation (HCC)  Allergic rhinitis, unspecified seasonality, unspecified trigger  Gastroesophageal reflux disease, unspecified whether esophagitis present - Plan: pantoprazole (PROTONIX) 40 MG tablet  Hypothyroidism, unspecified type - Plan: TSH, levothyroxine (SYNTHROID) 112 MCG tablet  Hemiparesis affecting right side as late effect of stroke (HCC)  Complex regional pain syndrome  type 1 of upper extremity, unspecified laterality  Mixed hyperlipidemia - Plan: Lipid panel, simvastatin (ZOCOR) 20 MG tablet  Long-term current use of opiate analgesic  Long term current use of anticoagulant therapy  Expressive aphasia  Chronic pain syndrome  Immunization refused  Gastroesophageal reflux disease - Plan: pantoprazole (PROTONIX) 40 MG tablet  Pure hypercholesterolemia - Plan: simvastatin (ZOCOR) 20 MG tablet  Erectile dysfunction, unspecified erectile dysfunction type - Plan: tadalafil (CIALIS) 20 MG tablet  Screening for colon cancer - Plan: Cologuard  S/P TKR (total knee replacement) using cement, right Immunization again discussed and he is not interested. Over 45 minutes spent discussing medications, diagnoses and treatment.

## 2022-05-30 LAB — CBC WITH DIFFERENTIAL/PLATELET
Basophils Absolute: 0 10*3/uL (ref 0.0–0.2)
Basos: 1 %
EOS (ABSOLUTE): 0.2 10*3/uL (ref 0.0–0.4)
Eos: 3 %
Hematocrit: 39.8 % (ref 37.5–51.0)
Immature Grans (Abs): 0 10*3/uL (ref 0.0–0.1)
Immature Granulocytes: 0 %
Lymphocytes Absolute: 1.9 10*3/uL (ref 0.7–3.1)
Lymphs: 26 %
MCHC: 32.9 g/dL (ref 31.5–35.7)
MCV: 87 fL (ref 79–97)
Monocytes Absolute: 0.5 10*3/uL (ref 0.1–0.9)
Platelets: 177 10*3/uL (ref 150–450)
RBC: 4.57 x10E6/uL (ref 4.14–5.80)
WBC: 7.2 10*3/uL (ref 3.4–10.8)

## 2022-05-30 LAB — COMPREHENSIVE METABOLIC PANEL
ALT: 16 IU/L (ref 0–44)
Albumin: 4.8 g/dL (ref 3.9–4.9)
Alkaline Phosphatase: 53 IU/L (ref 44–121)
BUN: 15 mg/dL (ref 8–27)
Bilirubin Total: 0.5 mg/dL (ref 0.0–1.2)
CO2: 17 mmol/L — ABNORMAL LOW (ref 20–29)
Chloride: 104 mmol/L (ref 96–106)
Creatinine, Ser: 0.83 mg/dL (ref 0.76–1.27)
Globulin, Total: 2.1 g/dL (ref 1.5–4.5)
Potassium: 4.4 mmol/L (ref 3.5–5.2)
Sodium: 139 mmol/L (ref 134–144)
Total Protein: 6.9 g/dL (ref 6.0–8.5)

## 2022-05-30 LAB — LIPID PANEL
Chol/HDL Ratio: 3.4 ratio (ref 0.0–5.0)
LDL Chol Calc (NIH): 94 mg/dL (ref 0–99)
Triglycerides: 114 mg/dL (ref 0–149)

## 2022-05-30 LAB — TSH: TSH: 0.935 u[IU]/mL (ref 0.450–4.500)

## 2022-05-30 MED ORDER — ATORVASTATIN CALCIUM 40 MG PO TABS: 40.0000 mg | ORAL_TABLET | Freq: Every day | ORAL | 3 refills | Status: DC

## 2022-05-30 NOTE — Addendum Note (Signed)
Addended by: Ronnald Nian on: 05/30/2022 12:11 PM   Modules accepted: Orders

## 2022-06-04 DIAGNOSIS — G8929 Other chronic pain: Secondary | ICD-10-CM | POA: Diagnosis not present

## 2022-06-04 DIAGNOSIS — Z79891 Long term (current) use of opiate analgesic: Secondary | ICD-10-CM | POA: Diagnosis not present

## 2022-06-04 DIAGNOSIS — M47817 Spondylosis without myelopathy or radiculopathy, lumbosacral region: Secondary | ICD-10-CM | POA: Diagnosis not present

## 2022-06-04 DIAGNOSIS — Z79899 Other long term (current) drug therapy: Secondary | ICD-10-CM | POA: Diagnosis not present

## 2022-06-04 DIAGNOSIS — M25562 Pain in left knee: Secondary | ICD-10-CM | POA: Diagnosis not present

## 2022-06-04 DIAGNOSIS — M5136 Other intervertebral disc degeneration, lumbar region: Secondary | ICD-10-CM | POA: Diagnosis not present

## 2022-06-04 DIAGNOSIS — M25532 Pain in left wrist: Secondary | ICD-10-CM | POA: Diagnosis not present

## 2022-06-04 DIAGNOSIS — M25561 Pain in right knee: Secondary | ICD-10-CM | POA: Diagnosis not present

## 2022-06-04 DIAGNOSIS — M545 Low back pain, unspecified: Secondary | ICD-10-CM | POA: Diagnosis not present

## 2022-06-04 DIAGNOSIS — G894 Chronic pain syndrome: Secondary | ICD-10-CM | POA: Diagnosis not present

## 2022-06-05 DIAGNOSIS — M25561 Pain in right knee: Secondary | ICD-10-CM | POA: Diagnosis not present

## 2022-06-06 DIAGNOSIS — Z5181 Encounter for therapeutic drug level monitoring: Secondary | ICD-10-CM | POA: Diagnosis not present

## 2022-06-07 ENCOUNTER — Telehealth: Payer: Self-pay

## 2022-06-07 ENCOUNTER — Ambulatory Visit (INDEPENDENT_AMBULATORY_CARE_PROVIDER_SITE_OTHER): Payer: Medicare HMO

## 2022-06-07 DIAGNOSIS — Z5181 Encounter for therapeutic drug level monitoring: Secondary | ICD-10-CM

## 2022-06-07 LAB — PROTIME-INR
INR: 2.8 — ABNORMAL HIGH (ref 0.9–1.2)
Prothrombin Time: 28.4 s — ABNORMAL HIGH (ref 9.1–12.0)

## 2022-06-07 NOTE — Patient Instructions (Signed)
Description   Spoke with wife and advised pt to continue taking warfarin 1 tablet (5 mg) daily except 1/2 tablet (2.5 mg) each Monday, Wednesday and Friday.  Repeat INR in 6 weeks at [facility] (has standing order). [phone]      

## 2022-06-07 NOTE — Telephone Encounter (Signed)
Pt went to [facility] yesterday to have PT/INR drawn. Called [facility] to determine if labs have been resulted. Spoke with Lupita Leash who stated there was a note stating there was an issue with specimen. Lupita Leash stated she would find out more information on issue and call [facility] back. Provided [facility] with direct phone number and fax number.

## 2022-06-20 ENCOUNTER — Other Ambulatory Visit: Payer: Self-pay | Admitting: Cardiovascular Disease

## 2022-06-20 DIAGNOSIS — I4821 Permanent atrial fibrillation: Secondary | ICD-10-CM

## 2022-06-20 LAB — COLOGUARD

## 2022-06-21 NOTE — Telephone Encounter (Signed)
error 

## 2022-07-01 DIAGNOSIS — Z1211 Encounter for screening for malignant neoplasm of colon: Secondary | ICD-10-CM | POA: Diagnosis not present

## 2022-07-02 DIAGNOSIS — M25561 Pain in right knee: Secondary | ICD-10-CM | POA: Diagnosis not present

## 2022-07-02 DIAGNOSIS — M5136 Other intervertebral disc degeneration, lumbar region: Secondary | ICD-10-CM | POA: Diagnosis not present

## 2022-07-02 DIAGNOSIS — M25562 Pain in left knee: Secondary | ICD-10-CM | POA: Diagnosis not present

## 2022-07-02 DIAGNOSIS — M25532 Pain in left wrist: Secondary | ICD-10-CM | POA: Diagnosis not present

## 2022-07-02 DIAGNOSIS — Z79891 Long term (current) use of opiate analgesic: Secondary | ICD-10-CM | POA: Diagnosis not present

## 2022-07-02 DIAGNOSIS — M545 Low back pain, unspecified: Secondary | ICD-10-CM | POA: Diagnosis not present

## 2022-07-02 DIAGNOSIS — M47817 Spondylosis without myelopathy or radiculopathy, lumbosacral region: Secondary | ICD-10-CM | POA: Diagnosis not present

## 2022-07-02 DIAGNOSIS — Z79899 Other long term (current) drug therapy: Secondary | ICD-10-CM | POA: Diagnosis not present

## 2022-07-02 DIAGNOSIS — G8929 Other chronic pain: Secondary | ICD-10-CM | POA: Diagnosis not present

## 2022-07-02 DIAGNOSIS — G894 Chronic pain syndrome: Secondary | ICD-10-CM | POA: Diagnosis not present

## 2022-07-07 LAB — COLOGUARD: COLOGUARD: NEGATIVE

## 2022-07-10 DIAGNOSIS — F0634 Mood disorder due to known physiological condition with mixed features: Secondary | ICD-10-CM | POA: Diagnosis not present

## 2022-07-10 DIAGNOSIS — R419 Unspecified symptoms and signs involving cognitive functions and awareness: Secondary | ICD-10-CM | POA: Diagnosis not present

## 2022-07-11 DIAGNOSIS — R419 Unspecified symptoms and signs involving cognitive functions and awareness: Secondary | ICD-10-CM | POA: Diagnosis not present

## 2022-07-11 DIAGNOSIS — F0634 Mood disorder due to known physiological condition with mixed features: Secondary | ICD-10-CM | POA: Diagnosis not present

## 2022-07-12 DIAGNOSIS — R419 Unspecified symptoms and signs involving cognitive functions and awareness: Secondary | ICD-10-CM | POA: Diagnosis not present

## 2022-07-12 DIAGNOSIS — F0634 Mood disorder due to known physiological condition with mixed features: Secondary | ICD-10-CM | POA: Diagnosis not present

## 2022-07-13 DIAGNOSIS — F0634 Mood disorder due to known physiological condition with mixed features: Secondary | ICD-10-CM | POA: Diagnosis not present

## 2022-07-13 DIAGNOSIS — R419 Unspecified symptoms and signs involving cognitive functions and awareness: Secondary | ICD-10-CM | POA: Diagnosis not present

## 2022-07-14 DIAGNOSIS — R419 Unspecified symptoms and signs involving cognitive functions and awareness: Secondary | ICD-10-CM | POA: Diagnosis not present

## 2022-07-14 DIAGNOSIS — F0634 Mood disorder due to known physiological condition with mixed features: Secondary | ICD-10-CM | POA: Diagnosis not present

## 2022-07-15 DIAGNOSIS — R419 Unspecified symptoms and signs involving cognitive functions and awareness: Secondary | ICD-10-CM | POA: Diagnosis not present

## 2022-07-15 DIAGNOSIS — F0634 Mood disorder due to known physiological condition with mixed features: Secondary | ICD-10-CM | POA: Diagnosis not present

## 2022-07-18 ENCOUNTER — Other Ambulatory Visit: Payer: Self-pay

## 2022-07-18 ENCOUNTER — Telehealth: Payer: Self-pay | Admitting: *Deleted

## 2022-07-18 DIAGNOSIS — I4821 Permanent atrial fibrillation: Secondary | ICD-10-CM | POA: Diagnosis not present

## 2022-07-18 NOTE — Telephone Encounter (Signed)
Called pt to inquire if he had gone to the lab; spoke with wife and she stated he would be going today. Will await labs and call back to discuss INR results once received.

## 2022-07-19 ENCOUNTER — Other Ambulatory Visit: Payer: Self-pay | Admitting: *Deleted

## 2022-07-19 ENCOUNTER — Ambulatory Visit (INDEPENDENT_AMBULATORY_CARE_PROVIDER_SITE_OTHER): Payer: Medicare HMO | Admitting: *Deleted

## 2022-07-19 DIAGNOSIS — Z8673 Personal history of transient ischemic attack (TIA), and cerebral infarction without residual deficits: Secondary | ICD-10-CM

## 2022-07-19 DIAGNOSIS — Z7901 Long term (current) use of anticoagulants: Secondary | ICD-10-CM

## 2022-07-19 DIAGNOSIS — I4821 Permanent atrial fibrillation: Secondary | ICD-10-CM

## 2022-07-19 LAB — PROTIME-INR
INR: 2.2 — ABNORMAL HIGH (ref 0.9–1.2)
Prothrombin Time: 23 s — ABNORMAL HIGH (ref 9.1–12.0)

## 2022-07-19 NOTE — Patient Instructions (Signed)
Description   Spoke with wife and advised pt to continue taking warfarin 1 tablet (5 mg) daily except 1/2 tablet (2.5 mg) each Monday, Wednesday and Friday.  Repeat INR in 6 weeks at [facility] (has standing order). [phone]      

## 2022-07-20 ENCOUNTER — Other Ambulatory Visit: Payer: Self-pay | Admitting: Family Medicine

## 2022-07-20 DIAGNOSIS — E039 Hypothyroidism, unspecified: Secondary | ICD-10-CM

## 2022-08-05 ENCOUNTER — Other Ambulatory Visit: Payer: Self-pay | Admitting: Family Medicine

## 2022-08-05 DIAGNOSIS — K219 Gastro-esophageal reflux disease without esophagitis: Secondary | ICD-10-CM

## 2022-08-06 DIAGNOSIS — M545 Low back pain, unspecified: Secondary | ICD-10-CM | POA: Diagnosis not present

## 2022-08-06 DIAGNOSIS — M5136 Other intervertebral disc degeneration, lumbar region: Secondary | ICD-10-CM | POA: Diagnosis not present

## 2022-08-06 DIAGNOSIS — M25562 Pain in left knee: Secondary | ICD-10-CM | POA: Diagnosis not present

## 2022-08-06 DIAGNOSIS — M47817 Spondylosis without myelopathy or radiculopathy, lumbosacral region: Secondary | ICD-10-CM | POA: Diagnosis not present

## 2022-08-06 DIAGNOSIS — Z79891 Long term (current) use of opiate analgesic: Secondary | ICD-10-CM | POA: Diagnosis not present

## 2022-08-06 DIAGNOSIS — G894 Chronic pain syndrome: Secondary | ICD-10-CM | POA: Diagnosis not present

## 2022-08-06 DIAGNOSIS — M25532 Pain in left wrist: Secondary | ICD-10-CM | POA: Diagnosis not present

## 2022-08-06 DIAGNOSIS — M25561 Pain in right knee: Secondary | ICD-10-CM | POA: Diagnosis not present

## 2022-08-06 DIAGNOSIS — Z79899 Other long term (current) drug therapy: Secondary | ICD-10-CM | POA: Diagnosis not present

## 2022-08-06 DIAGNOSIS — G8929 Other chronic pain: Secondary | ICD-10-CM | POA: Diagnosis not present

## 2022-08-29 ENCOUNTER — Telehealth: Payer: Self-pay | Admitting: *Deleted

## 2022-08-29 DIAGNOSIS — I4821 Permanent atrial fibrillation: Secondary | ICD-10-CM | POA: Diagnosis not present

## 2022-08-29 NOTE — Telephone Encounter (Signed)
Called pt since INR is due. Wife states that they will go to the lab in a bit. Since going later advised will follow up tomorrow if not received my today and she verbalized understanding.

## 2022-08-30 ENCOUNTER — Ambulatory Visit (INDEPENDENT_AMBULATORY_CARE_PROVIDER_SITE_OTHER): Payer: Medicare HMO

## 2022-08-30 DIAGNOSIS — Z8673 Personal history of transient ischemic attack (TIA), and cerebral infarction without residual deficits: Secondary | ICD-10-CM

## 2022-08-30 DIAGNOSIS — Z7901 Long term (current) use of anticoagulants: Secondary | ICD-10-CM | POA: Diagnosis not present

## 2022-08-30 DIAGNOSIS — I4821 Permanent atrial fibrillation: Secondary | ICD-10-CM | POA: Diagnosis not present

## 2022-08-30 NOTE — Patient Instructions (Signed)
Description   Spoke with wife and advised pt to continue taking warfarin 1 tablet (5 mg) daily except 1/2 tablet (2.5 mg) each Monday, Wednesday and Friday.  Repeat INR in 6 weeks at [facility] (has standing order). [phone]      

## 2022-09-10 DIAGNOSIS — M545 Low back pain, unspecified: Secondary | ICD-10-CM | POA: Diagnosis not present

## 2022-09-10 DIAGNOSIS — Z79899 Other long term (current) drug therapy: Secondary | ICD-10-CM | POA: Diagnosis not present

## 2022-09-10 DIAGNOSIS — M47817 Spondylosis without myelopathy or radiculopathy, lumbosacral region: Secondary | ICD-10-CM | POA: Diagnosis not present

## 2022-09-10 DIAGNOSIS — Z79891 Long term (current) use of opiate analgesic: Secondary | ICD-10-CM | POA: Diagnosis not present

## 2022-09-10 DIAGNOSIS — G894 Chronic pain syndrome: Secondary | ICD-10-CM | POA: Diagnosis not present

## 2022-09-10 DIAGNOSIS — M25562 Pain in left knee: Secondary | ICD-10-CM | POA: Diagnosis not present

## 2022-09-10 DIAGNOSIS — G8929 Other chronic pain: Secondary | ICD-10-CM | POA: Diagnosis not present

## 2022-09-10 DIAGNOSIS — M25532 Pain in left wrist: Secondary | ICD-10-CM | POA: Diagnosis not present

## 2022-09-10 DIAGNOSIS — M5136 Other intervertebral disc degeneration, lumbar region: Secondary | ICD-10-CM | POA: Diagnosis not present

## 2022-09-10 DIAGNOSIS — M25561 Pain in right knee: Secondary | ICD-10-CM | POA: Diagnosis not present

## 2022-10-11 ENCOUNTER — Telehealth: Payer: Self-pay

## 2022-10-11 DIAGNOSIS — I4821 Permanent atrial fibrillation: Secondary | ICD-10-CM | POA: Diagnosis not present

## 2022-10-11 NOTE — Telephone Encounter (Signed)
Called and spoke with Cordelia Pen. Confirmed pt is going to lab today to have INR drawn.

## 2022-10-12 ENCOUNTER — Ambulatory Visit (INDEPENDENT_AMBULATORY_CARE_PROVIDER_SITE_OTHER): Payer: Medicare HMO | Admitting: Cardiology

## 2022-10-12 ENCOUNTER — Telehealth: Payer: Self-pay

## 2022-10-12 DIAGNOSIS — Z8673 Personal history of transient ischemic attack (TIA), and cerebral infarction without residual deficits: Secondary | ICD-10-CM | POA: Diagnosis not present

## 2022-10-12 DIAGNOSIS — Z7901 Long term (current) use of anticoagulants: Secondary | ICD-10-CM

## 2022-10-12 NOTE — Telephone Encounter (Signed)
Lp's wife message to call back and discuss INR result.

## 2022-10-15 ENCOUNTER — Other Ambulatory Visit: Payer: Self-pay | Admitting: Cardiovascular Disease

## 2022-10-15 DIAGNOSIS — G8929 Other chronic pain: Secondary | ICD-10-CM | POA: Diagnosis not present

## 2022-10-15 DIAGNOSIS — M25562 Pain in left knee: Secondary | ICD-10-CM | POA: Diagnosis not present

## 2022-10-15 DIAGNOSIS — Z79899 Other long term (current) drug therapy: Secondary | ICD-10-CM | POA: Diagnosis not present

## 2022-10-15 DIAGNOSIS — M25561 Pain in right knee: Secondary | ICD-10-CM | POA: Diagnosis not present

## 2022-10-15 DIAGNOSIS — G894 Chronic pain syndrome: Secondary | ICD-10-CM | POA: Diagnosis not present

## 2022-10-15 DIAGNOSIS — M47817 Spondylosis without myelopathy or radiculopathy, lumbosacral region: Secondary | ICD-10-CM | POA: Diagnosis not present

## 2022-10-15 DIAGNOSIS — M545 Low back pain, unspecified: Secondary | ICD-10-CM | POA: Diagnosis not present

## 2022-10-15 DIAGNOSIS — M25532 Pain in left wrist: Secondary | ICD-10-CM | POA: Diagnosis not present

## 2022-10-15 DIAGNOSIS — M5136 Other intervertebral disc degeneration, lumbar region: Secondary | ICD-10-CM | POA: Diagnosis not present

## 2022-10-15 DIAGNOSIS — I4821 Permanent atrial fibrillation: Secondary | ICD-10-CM

## 2022-10-15 DIAGNOSIS — Z79891 Long term (current) use of opiate analgesic: Secondary | ICD-10-CM | POA: Diagnosis not present

## 2022-11-01 DIAGNOSIS — M5416 Radiculopathy, lumbar region: Secondary | ICD-10-CM | POA: Diagnosis not present

## 2022-11-19 DIAGNOSIS — G8929 Other chronic pain: Secondary | ICD-10-CM | POA: Diagnosis not present

## 2022-11-19 DIAGNOSIS — M25532 Pain in left wrist: Secondary | ICD-10-CM | POA: Diagnosis not present

## 2022-11-19 DIAGNOSIS — Z79891 Long term (current) use of opiate analgesic: Secondary | ICD-10-CM | POA: Diagnosis not present

## 2022-11-19 DIAGNOSIS — M51361 Other intervertebral disc degeneration, lumbar region with lower extremity pain only: Secondary | ICD-10-CM | POA: Diagnosis not present

## 2022-11-19 DIAGNOSIS — M25561 Pain in right knee: Secondary | ICD-10-CM | POA: Diagnosis not present

## 2022-11-19 DIAGNOSIS — M545 Low back pain, unspecified: Secondary | ICD-10-CM | POA: Diagnosis not present

## 2022-11-19 DIAGNOSIS — M47817 Spondylosis without myelopathy or radiculopathy, lumbosacral region: Secondary | ICD-10-CM | POA: Diagnosis not present

## 2022-11-19 DIAGNOSIS — Z79899 Other long term (current) drug therapy: Secondary | ICD-10-CM | POA: Diagnosis not present

## 2022-11-19 DIAGNOSIS — M25562 Pain in left knee: Secondary | ICD-10-CM | POA: Diagnosis not present

## 2022-11-19 DIAGNOSIS — G894 Chronic pain syndrome: Secondary | ICD-10-CM | POA: Diagnosis not present

## 2022-11-21 ENCOUNTER — Ambulatory Visit: Payer: Medicare HMO | Admitting: Family Medicine

## 2022-11-21 ENCOUNTER — Encounter: Payer: Self-pay | Admitting: Family Medicine

## 2022-11-21 VITALS — BP 110/78 | HR 74 | Ht 73.0 in | Wt 174.8 lb

## 2022-11-21 DIAGNOSIS — N529 Male erectile dysfunction, unspecified: Secondary | ICD-10-CM

## 2022-11-21 DIAGNOSIS — I69351 Hemiplegia and hemiparesis following cerebral infarction affecting right dominant side: Secondary | ICD-10-CM

## 2022-11-21 DIAGNOSIS — G8929 Other chronic pain: Secondary | ICD-10-CM | POA: Diagnosis not present

## 2022-11-21 DIAGNOSIS — R4701 Aphasia: Secondary | ICD-10-CM

## 2022-11-21 DIAGNOSIS — E039 Hypothyroidism, unspecified: Secondary | ICD-10-CM

## 2022-11-21 DIAGNOSIS — E782 Mixed hyperlipidemia: Secondary | ICD-10-CM

## 2022-11-21 DIAGNOSIS — K219 Gastro-esophageal reflux disease without esophagitis: Secondary | ICD-10-CM

## 2022-11-21 DIAGNOSIS — N5201 Erectile dysfunction due to arterial insufficiency: Secondary | ICD-10-CM

## 2022-11-21 DIAGNOSIS — Z2821 Immunization not carried out because of patient refusal: Secondary | ICD-10-CM

## 2022-11-21 DIAGNOSIS — Z96651 Presence of right artificial knee joint: Secondary | ICD-10-CM

## 2022-11-21 DIAGNOSIS — M545 Low back pain, unspecified: Secondary | ICD-10-CM

## 2022-11-21 DIAGNOSIS — Z7901 Long term (current) use of anticoagulants: Secondary | ICD-10-CM

## 2022-11-21 DIAGNOSIS — Z79891 Long term (current) use of opiate analgesic: Secondary | ICD-10-CM

## 2022-11-21 DIAGNOSIS — I10 Essential (primary) hypertension: Secondary | ICD-10-CM

## 2022-11-21 DIAGNOSIS — M5136 Other intervertebral disc degeneration, lumbar region with discogenic back pain only: Secondary | ICD-10-CM

## 2022-11-21 DIAGNOSIS — I428 Other cardiomyopathies: Secondary | ICD-10-CM

## 2022-11-21 DIAGNOSIS — E038 Other specified hypothyroidism: Secondary | ICD-10-CM

## 2022-11-21 DIAGNOSIS — I4811 Longstanding persistent atrial fibrillation: Secondary | ICD-10-CM

## 2022-11-21 DIAGNOSIS — J301 Allergic rhinitis due to pollen: Secondary | ICD-10-CM

## 2022-11-21 DIAGNOSIS — Z8673 Personal history of transient ischemic attack (TIA), and cerebral infarction without residual deficits: Secondary | ICD-10-CM

## 2022-11-21 DIAGNOSIS — G90511 Complex regional pain syndrome I of right upper limb: Secondary | ICD-10-CM

## 2022-11-21 DIAGNOSIS — Z0000 Encounter for general adult medical examination without abnormal findings: Secondary | ICD-10-CM | POA: Diagnosis not present

## 2022-11-21 MED ORDER — ATORVASTATIN CALCIUM 40 MG PO TABS: 40.0000 mg | ORAL_TABLET | Freq: Every day | ORAL | 3 refills | Status: DC

## 2022-11-21 MED ORDER — LEVOTHYROXINE SODIUM 112 MCG PO TABS: 112.0000 ug | ORAL_TABLET | Freq: Every day | ORAL | 1 refills | Status: DC

## 2022-11-21 MED ORDER — PANTOPRAZOLE SODIUM 40 MG PO TBEC
40.0000 mg | DELAYED_RELEASE_TABLET | Freq: Every day | ORAL | 1 refills | Status: DC
Start: 2022-11-21 — End: 2023-07-08

## 2022-11-21 MED ORDER — RAMIPRIL 2.5 MG PO CAPS: 2.5000 mg | ORAL_CAPSULE | Freq: Every day | ORAL | 3 refills | Status: DC

## 2022-11-21 MED ORDER — CARVEDILOL 25 MG PO TABS: ORAL_TABLET | ORAL | 3 refills | Status: AC

## 2022-11-21 NOTE — Progress Notes (Signed)
Complete physical exam  Patient: Eric Ross   DOB: May 19, 1955   67 y.o. Male  MRN: [id number]  Subjective:    Chief Complaint  Patient presents with   Annual Exam    Eric Ross is a 67 y.o. male who presents today for a complete physical exam. He reports consuming a general diet. Home exercise routine includes mowing the grass once a week. He generally feels fairly well. He reports sleeping poorly. He does not have additional problems to discuss today.  He continues to have difficulty with right knee pain post TKR.  Presently he is taking Neurontin 300 mg twice per day but still having a lot of pain.  He continues on codeine for treatment of his back pain and is followed by back pain clinic.  He does have difficulty with constipation and does use MiraLAX.  His reflux symptoms seem to be under good control using Protonix.  He continues on his thyroid medication.  He is also taking Coumadin for his A-fib.  His allergies seem to be under good control.  ED does not seem to be a big issue right now. He does see cardiology periodically.  Most recent fall risk assessment:    12/01/2021   10:37 AM  Fall Risk   Falls in the past year? 1  Number falls in past yr: 0  Injury with Fall? 0  Risk for fall due to : Impaired balance/gait;Impaired mobility;Medication side effect  Follow up Falls evaluation completed;Education provided;Falls prevention discussed     Most recent depression screenings:    12/01/2021   10:38 AM 10/31/2020    2:51 PM  PHQ 2/9 Scores  PHQ - 2 Score 0 0  PHQ- 9 Score 3     Vision:Not within last year  and Dental: No current dental problems and Receives regular dental care    Patient Care Team: Ronnald Nian, MD as PCP - General (Family Medicine) Regan Lemming, MD as PCP - Electrophysiology (Cardiology) Runell Gess, MD as PCP - Cardiology (Cardiology)   Outpatient Medications Prior to Visit  Medication Sig   acetaminophen (TYLENOL)  500 MG tablet Take 500-1,000 mg by mouth every 6 (six) hours as needed (headaches.).   cetirizine (ZYRTEC) 10 MG tablet Take 10 mg by mouth daily as needed for allergies.   diclofenac Sodium (VOLTAREN) 1 % GEL Apply 4 g topically 2 (two) times daily as needed (pain.).   gabapentin (NEURONTIN) 300 MG capsule SMARTSIG:1 Capsule(s) By Mouth Every Evening   oxyCODONE-acetaminophen (PERCOCET/ROXICET) 5-325 MG tablet Take 1 tablet by mouth every 4 (four) hours as needed for severe pain.   tadalafil (CIALIS) 20 MG tablet Take 1 tablet (20 mg total) by mouth daily as needed for erectile dysfunction.   tiZANidine (ZANAFLEX) 2 MG tablet Take 1 tablet (2 mg total) by mouth every 6 (six) hours as needed.   warfarin (COUMADIN) 5 MG tablet TAKE 1/2 TO 1 TABLET DAILY AS PRESCRIBED BY COUMADIN CLINIC   Aspirin-Salicylamide-Caffeine (BC FAST PAIN RELIEF) 650-195-33.3 MG PACK Take 1 packet by mouth daily as needed (pain.). (Patient not taking: Reported on 11/21/2022)   [DISCONTINUED] atorvastatin (LIPITOR) 40 MG tablet Take 1 tablet (40 mg total) by mouth daily.   [DISCONTINUED] carvedilol (COREG) 25 MG tablet TAKE 1.5 TABLETS (37.5 MG TOTAL) BY MOUTH TWICE A DAY   [DISCONTINUED] levothyroxine (SYNTHROID) 112 MCG tablet TAKE 1 TABLET BY MOUTH EVERY DAY   [DISCONTINUED] pantoprazole (PROTONIX) 40 MG tablet TAKE 1 TABLET BY  MOUTH EVERY DAY   [DISCONTINUED] ramipril (ALTACE) 2.5 MG capsule Take 1 capsule (2.5 mg total) by mouth daily.   No facility-administered medications prior to visit.    Review of Systems  All other systems reviewed and are negative.         Objective:        Physical Exam  Alert and in no distress. Tympanic membranes and canals are normal. Pharyngeal area is normal. Neck is supple without adenopathy or thyromegaly. Cardiac exam shows a regular sinus rhythm without murmurs or gallops. Lungs are clear to auscultation.      Assessment & Plan:    Routine general medical  examination at a health care facility  Seasonal allergic rhinitis due to pollen  Longstanding persistent atrial fibrillation (HCC)  Chronic right-sided low back pain, unspecified whether sciatica present  Erectile dysfunction due to arterial insufficiency  Essential hypertension - Plan: ramipril (ALTACE) 2.5 MG capsule  Expressive aphasia  History of CVA (cerebrovascular accident)  Hemiparesis affecting right side as late effect of stroke (HCC)  Long term current use of anticoagulant therapy  Long-term current use of opiate analgesic  Degeneration of intervertebral disc of lumbar region with discogenic back pain  Mixed hyperlipidemia - Plan: atorvastatin (LIPITOR) 40 MG tablet  Nonischemic cardiomyopathy (HCC) - Plan: ramipril (ALTACE) 2.5 MG capsule, carvedilol (COREG) 25 MG tablet  S/P TKR (total knee replacement) using cement, right  Erectile dysfunction, unspecified erectile dysfunction type  Hypothyroidism, unspecified type - Plan: levothyroxine (SYNTHROID) 112 MCG tablet  Gastroesophageal reflux disease - Plan: pantoprazole (PROTONIX) 40 MG tablet  Immunization refused  Immunization History  Administered Date(s) Administered   Influenza Inj Mdck Quad Pf 10/19/2016, 10/09/2017   Influenza Split 10/04/2014   Influenza, Seasonal, Injecte, Preservative Fre 10/09/2017   Influenza,inj,Quad PF,6+ Mos 11/21/2018   Influenza-Unspecified 10/22/2015   Td 04/26/2006    Health Maintenance  Topic Date Due   Zoster Vaccines- Shingrix (1 of 2) Never done   DTaP/Tdap/Td (2 - Tdap) 04/25/2016   Pneumonia Vaccine 76+ Years old (1 of 1 - PCV) Never done   Medicare Annual Wellness (AWV)  12/02/2022   COVID-19 Vaccine (1) 12/07/2022 (Originally 01/19/1961)   INFLUENZA VACCINE  04/29/2023 (Originally 08/30/2022)   Fecal DNA (Cologuard)  06/30/2025   Hepatitis C Screening  Completed   HPV VACCINES  Aged Out   Colonoscopy  Discontinued     Problem List Items Addressed This  Visit     Allergic rhinitis   Atrial fibrillation (HCC)   Relevant Medications   ramipril (ALTACE) 2.5 MG capsule   atorvastatin (LIPITOR) 40 MG tablet   carvedilol (COREG) 25 MG tablet   Chronic low back pain   Erectile dysfunction   Essential hypertension   Relevant Medications   ramipril (ALTACE) 2.5 MG capsule   atorvastatin (LIPITOR) 40 MG tablet   carvedilol (COREG) 25 MG tablet   Expressive aphasia   Gastroesophageal reflux disease   Relevant Medications   pantoprazole (PROTONIX) 40 MG tablet   Hemiparesis affecting right side as late effect of stroke (HCC)   History of CVA (cerebrovascular accident)   Hypothyroidism   Relevant Medications   levothyroxine (SYNTHROID) 112 MCG tablet   carvedilol (COREG) 25 MG tablet   Immunization refused   Long term current use of anticoagulant therapy   Long-term current use of opiate analgesic   Lumbar degenerative disc disease   Mixed hyperlipidemia   Relevant Medications   ramipril (ALTACE) 2.5 MG capsule   atorvastatin (LIPITOR) 40  MG tablet   carvedilol (COREG) 25 MG tablet   Nonischemic cardiomyopathy (HCC)   Relevant Medications   ramipril (ALTACE) 2.5 MG capsule   atorvastatin (LIPITOR) 40 MG tablet   carvedilol (COREG) 25 MG tablet   S/P TKR (total knee replacement) using cement, right   Other Visit Diagnoses     Routine general medical examination at a health care facility    -  Primary      Return in about 6 months (around 05/22/2023).     Sharlot Gowda, MD

## 2022-11-22 ENCOUNTER — Telehealth: Payer: Self-pay | Admitting: *Deleted

## 2022-11-22 ENCOUNTER — Other Ambulatory Visit: Payer: Self-pay

## 2022-11-22 DIAGNOSIS — I4821 Permanent atrial fibrillation: Secondary | ICD-10-CM | POA: Diagnosis not present

## 2022-11-22 NOTE — Telephone Encounter (Signed)
Called pt to see if pt has gone to the lab for INR lab. Wife states he was mowing the grass and will go afterwards. Advised will follow up tomorrow.

## 2022-11-23 ENCOUNTER — Telehealth: Payer: Self-pay

## 2022-11-23 ENCOUNTER — Ambulatory Visit (INDEPENDENT_AMBULATORY_CARE_PROVIDER_SITE_OTHER): Payer: Medicare HMO | Admitting: Cardiovascular Disease

## 2022-11-23 DIAGNOSIS — Z8673 Personal history of transient ischemic attack (TIA), and cerebral infarction without residual deficits: Secondary | ICD-10-CM

## 2022-11-23 DIAGNOSIS — M5459 Other low back pain: Secondary | ICD-10-CM | POA: Diagnosis not present

## 2022-11-23 DIAGNOSIS — Z7901 Long term (current) use of anticoagulants: Secondary | ICD-10-CM | POA: Diagnosis not present

## 2022-11-23 LAB — PROTIME-INR
INR: 2.4 — ABNORMAL HIGH (ref 0.9–1.2)
Prothrombin Time: 24.8 s — ABNORMAL HIGH (ref 9.1–12.0)

## 2022-11-23 NOTE — Telephone Encounter (Signed)
Lpmtcb to discuss INR result.

## 2022-11-26 DIAGNOSIS — M546 Pain in thoracic spine: Secondary | ICD-10-CM | POA: Diagnosis not present

## 2022-11-27 ENCOUNTER — Ambulatory Visit: Payer: Medicare HMO

## 2022-11-27 DIAGNOSIS — Z0000 Encounter for general adult medical examination without abnormal findings: Secondary | ICD-10-CM | POA: Diagnosis not present

## 2022-11-27 NOTE — Progress Notes (Signed)
Subjective:   DAX VOLD is a 67 y.o. male who presents for Medicare Annual/Subsequent preventive examination.  Visit Complete: Virtual I connected with  Demetrius Charity on 11/27/22 by a video and audio enabled telemedicine application and verified that I am speaking with the correct person using two identifiers. Spouse was also on call.  Patient Location: Home  Provider Location: Office/Clinic  I discussed the limitations of evaluation and management by telemedicine. The patient expressed understanding and agreed to proceed.  Vital Signs: Because this visit was a virtual/telehealth visit, some criteria may be missing or patient reported. Any vitals not documented were not able to be obtained and vitals that have been documented are patient reported.    Cardiac Risk Factors include: advanced age (>94men, >35 women);dyslipidemia;hypertension;male gender     Objective:    Today's Vitals   There is no height or weight on file to calculate BMI.     11/27/2022   10:06 AM 12/01/2021   10:37 AM 06/30/2021   10:26 AM 06/19/2021    5:37 AM 06/07/2021   11:10 AM 10/31/2020    2:49 PM 10/30/2019    1:40 PM  Advanced Directives  Does Patient Have a Medical Advance Directive? No No No No No No No  Would patient like information on creating a medical advance directive?   No - Patient declined No - Patient declined No - Patient declined Yes (ED - Information included in AVS) Yes (MAU/Ambulatory/Procedural Areas - Information given)    Current Medications (verified) Outpatient Encounter Medications as of 11/27/2022  Medication Sig   acetaminophen (TYLENOL) 500 MG tablet Take 500-1,000 mg by mouth every 6 (six) hours as needed (headaches.).   atorvastatin (LIPITOR) 40 MG tablet Take 1 tablet (40 mg total) by mouth daily.   carvedilol (COREG) 25 MG tablet TAKE 1.5 TABLETS (37.5 MG TOTAL) BY MOUTH TWICE A DAY   cetirizine (ZYRTEC) 10 MG tablet Take 10 mg by mouth daily as needed for  allergies.   diclofenac Sodium (VOLTAREN) 1 % GEL Apply 4 g topically 2 (two) times daily as needed (pain.).   gabapentin (NEURONTIN) 300 MG capsule SMARTSIG:1 Capsule(s) By Mouth Every Evening   levothyroxine (SYNTHROID) 112 MCG tablet Take 1 tablet (112 mcg total) by mouth daily.   oxyCODONE-acetaminophen (PERCOCET/ROXICET) 5-325 MG tablet Take 1 tablet by mouth every 4 (four) hours as needed for severe pain.   pantoprazole (PROTONIX) 40 MG tablet Take 1 tablet (40 mg total) by mouth daily.   ramipril (ALTACE) 2.5 MG capsule Take 1 capsule (2.5 mg total) by mouth daily.   tadalafil (CIALIS) 20 MG tablet Take 1 tablet (20 mg total) by mouth daily as needed for erectile dysfunction.   tiZANidine (ZANAFLEX) 2 MG tablet Take 1 tablet (2 mg total) by mouth every 6 (six) hours as needed.   warfarin (COUMADIN) 5 MG tablet TAKE 1/2 TO 1 TABLET DAILY AS PRESCRIBED BY COUMADIN CLINIC   Aspirin-Salicylamide-Caffeine (BC FAST PAIN RELIEF) 650-195-33.3 MG PACK Take 1 packet by mouth daily as needed (pain.). (Patient not taking: Reported on 11/21/2022)   No facility-administered encounter medications on file as of 11/27/2022.    Allergies (verified) Penicillins   History: Past Medical History:  Diagnosis Date   Atrial fibrillation (HCC)    Dr. Allyson Sabal   Cardiomyopathy    nonischemic   Dyspnea    Erectile dysfunction    GERD (gastroesophageal reflux disease)    Headache    History of kidney stones  History of thyroid cancer    Hyperlipidemia    Hypothyroidism    Pseudogout    Stroke (HCC) 01/29/2001   Thyroid cancer [facility])    Past Surgical History:  Procedure Laterality Date   CARDIAC CATHETERIZATION  09/22/2001   NORMAL CORONARY ARTERIES   JOINT REPLACEMENT     BILATERAL KNEE    KNEE SURGERY Bilateral    LEXISCAN MYOCARDIAL PERFUSION STUDY  05/30/2011   NORMAL MYOCARDIAL PERFUSION STUDY EF% 49%.   THYROIDECTOMY     TOTAL KNEE ARTHROPLASTY Right 06/19/2021   Procedure: RIGHT TOTAL  KNEE ARTHROPLASTY;  Surgeon: Gean Birchwood, MD;  Location: [facility];  Service: Orthopedics;  Laterality: Right;   TRANSTHORACIC ECHOCARDIOGRAM  05/30/2011   EF%=45-50%. NORMAL LV WALL THICKNESS. RV IS MILDLY DILATED. NO SIGNIFICANT VALVE DISEASE.    Family History  Problem Relation Age of Onset   Thyroid cancer Mother    Cancer Sister    Stroke Brother    Social History   Socioeconomic History   Marital status: Married    Spouse name: Not on file   Number of children: Not on file   Years of education: Not on file   Highest education level: Not on file  Occupational History   Not on file  Tobacco Use   Smoking status: Never   Smokeless tobacco: Current    Types: Chew  Vaping Use   Vaping status: Never Used  Substance and Sexual Activity   Alcohol use: No   Drug use: Yes    Types: Oxycodone   Sexual activity: Yes  Other Topics Concern   Not on file  Social History Narrative   Not on file   Social Determinants of Health   Financial Resource Strain: Low Risk  (11/27/2022)   Overall Financial Resource Strain (CARDIA)    Difficulty of Paying Living Expenses: Not hard at all  Food Insecurity: No Food Insecurity (11/27/2022)   Hunger Vital Sign    Worried About Running Out of Food in the Last Year: Never true    Ran Out of Food in the Last Year: Never true  Transportation Needs: No Transportation Needs (11/27/2022)   PRAPARE - Administrator, Civil Service (Medical): No    Lack of Transportation (Non-Medical): No  Physical Activity: Inactive (11/27/2022)   Exercise Vital Sign    Days of Exercise per Week: 0 days    Minutes of Exercise per Session: 0 min  Stress: No Stress Concern Present (11/27/2022)   [employer] of Occupational Health - Occupational Stress Questionnaire    Feeling of Stress : Not at all  Recent Concern: Stress - Stress Concern Present (11/21/2022)   [employer] of Occupational Health - Occupational Stress Questionnaire     Feeling of Stress : Rather much  Social Connections: Socially Isolated (11/27/2022)   Social Connection and Isolation Panel [NHANES]    Frequency of Communication with Friends and Family: Never    Frequency of Social Gatherings with Friends and Family: Never    Attends Religious Services: Never    Database administrator or Organizations: No    Attends Engineer, structural: Never    Marital Status: Married    Tobacco Counseling Ready to quit: Not Answered Counseling given: Not Answered   Clinical Intake:  Pre-visit preparation completed: Yes  Pain : No/denies pain     Diabetes: No  How often do you need to have someone help you when you read instructions, pamphlets, or other written materials  from your doctor or pharmacy?: 1 - Never  Interpreter Needed?: No  Information entered by :: NAllen LPN   Activities of Daily Living    11/27/2022   10:01 AM 12/01/2021   10:39 AM  In your present state of health, do you have any difficulty performing the following activities:  Hearing? 0 0  Vision? 0 0  Difficulty concentrating or making decisions? 0 0  Walking or climbing stairs? 1 1  Comment due to stroke   Dressing or bathing? 0 0  Doing errands, shopping? 1 1  Comment  spouse is usually with him  Preparing Food and eating ? N N  Using the Toilet? N N  In the past six months, have you accidently leaked urine? N N  Do you have problems with loss of bowel control? N N  Managing your Medications? N N  Managing your Finances? N N  Housekeeping or managing your Housekeeping? N N    Patient Care Team: Ronnald Nian, MD as PCP - General (Family Medicine) Regan Lemming, MD as PCP - Electrophysiology (Cardiology) Runell Gess, MD as PCP - Cardiology (Cardiology)  Indicate any recent Medical Services you may have received from other than [facility] providers in the past year (date may be approximate).     Assessment:   This is a routine wellness  examination for Cindy.  Hearing/Vision screen Hearing Screening - Comments:: Denies hearing issues Vision Screening - Comments:: Regular eye exams, [facility]   Goals Addressed             This Visit's Progress    Patient Stated       11/26/2021, denies goals at this time       Depression Screen    11/27/2022   10:08 AM 12/01/2021   10:38 AM 10/31/2020    2:51 PM 10/30/2019    1:42 PM 07/22/2019   10:45 AM 08/08/2018    3:11 PM 11/27/2017    2:02 PM  PHQ 2/9 Scores  PHQ - 2 Score 0 0 0 0 0 0 0  PHQ- 9 Score 3 3         Fall Risk    11/27/2022   10:07 AM 12/01/2021   10:37 AM 10/31/2020    2:50 PM 10/30/2019    1:41 PM 08/08/2018    3:11 PM  Fall Risk   Falls in the past year? 1 1 1 1 1   Comment stumbles      Number falls in past yr: 1 0 1 1 1   Injury with Fall? 0 0 0 0 1  Risk for fall due to : History of fall(s);Medication side effect Impaired balance/gait;Impaired mobility;Medication side effect Impaired balance/gait Impaired balance/gait   Follow up Falls prevention discussed;Falls evaluation completed Falls evaluation completed;Education provided;Falls prevention discussed Falls evaluation completed      MEDICARE RISK AT HOME: Medicare Risk at Home Any stairs in or around the home?: Yes If so, are there any without handrails?: No Home free of loose throw rugs in walkways, pet beds, electrical cords, etc?: Yes Adequate lighting in your home to reduce risk of falls?: Yes Life alert?: No Use of a cane, walker or w/c?: No Grab bars in the bathroom?: Yes Shower chair or bench in shower?: Yes Elevated toilet seat or a handicapped toilet?: Yes  TIMED UP AND GO:  Was the test performed?  No    Cognitive Function:  6 CIT not administered. Patient has expressive aphasia. Patient appeared cognitive via video.  Immunizations Immunization History  Administered Date(s) Administered   Influenza Inj Mdck Quad Pf 10/19/2016, 10/09/2017   Influenza Split  10/04/2014   Influenza, Seasonal, Injecte, Preservative Fre 10/09/2017   Influenza,inj,Quad PF,6+ Mos 11/21/2018   Influenza-Unspecified 10/22/2015   Td 04/26/2006    TDAP status: Due, Education has been provided regarding the importance of this vaccine. Advised may receive this vaccine at local pharmacy or Health Dept. Aware to provide a copy of the vaccination record if obtained from local pharmacy or Health Dept. Verbalized acceptance and understanding.  Flu Vaccine status: Declined, Education has been provided regarding the importance of this vaccine but patient still declined. Advised may receive this vaccine at local pharmacy or Health Dept. Aware to provide a copy of the vaccination record if obtained from local pharmacy or Health Dept. Verbalized acceptance and understanding.  Pneumococcal vaccine status: Declined,  Education has been provided regarding the importance of this vaccine but patient still declined. Advised may receive this vaccine at local pharmacy or Health Dept. Aware to provide a copy of the vaccination record if obtained from local pharmacy or Health Dept. Verbalized acceptance and understanding.   Covid-19 vaccine status: Information provided on how to obtain vaccines.   Qualifies for Shingles Vaccine? Yes   Zostavax completed No   Shingrix Completed?: No.    Education has been provided regarding the importance of this vaccine. Patient has been advised to call insurance company to determine out of pocket expense if they have not yet received this vaccine. Advised may also receive vaccine at local pharmacy or Health Dept. Verbalized acceptance and understanding.  Screening Tests Health Maintenance  Topic Date Due   DTaP/Tdap/Td (2 - Tdap) 04/25/2016   COVID-19 Vaccine (1) 12/07/2022 (Originally 01/19/1961)   Zoster Vaccines- Shingrix (1 of 2) 02/27/2023 (Originally [birth year]18/1974)   INFLUENZA VACCINE  04/29/2023 (Originally 08/30/2022)   Pneumonia Vaccine 45+ Years old (1  of 1 - PCV) 11/27/2023 (Originally 01/19/2020)   Medicare Annual Wellness (AWV)  11/27/2023   Fecal DNA (Cologuard)  06/30/2025   Hepatitis C Screening  Completed   HPV VACCINES  Aged Out   Colonoscopy  Discontinued    Health Maintenance  Health Maintenance Due  Topic Date Due   DTaP/Tdap/Td (2 - Tdap) 04/25/2016    Colorectal cancer screening: Type of screening: Cologuard. Completed 07/01/2022. Repeat every 3 years  Lung Cancer Screening: (Low Dose CT Chest recommended if Age 57-80 years, 20 pack-year currently smoking OR have quit w/in 15years.) does not qualify.   Lung Cancer Screening Referral: no  Additional Screening:  Hepatitis C Screening: does qualify; Completed 01/05/2015  Vision Screening: Recommended annual ophthalmology exams for early detection of glaucoma and other disorders of the eye. Is the patient up to date with their annual eye exam?  No  Who is the provider or what is the name of the office in which the patient attends annual eye exams? [facility] If pt is not established with a provider, would they like to be referred to a provider to establish care? No .   Dental Screening: Recommended annual dental exams for proper oral hygiene  Diabetic Foot Exam: n/a  Community Resource Referral / Chronic Care Management: CRR required this visit?  No   CCM required this visit?  No     Plan:     I have personally reviewed and noted the following in the patient's chart:   Medical and social history Use of alcohol, tobacco or illicit drugs  Current medications and supplements including  opioid prescriptions. Patient is currently taking opioid prescriptions. Information provided to patient regarding non-opioid alternatives. Patient advised to discuss non-opioid treatment plan with their provider. Functional ability and status Nutritional status Physical activity Advanced directives List of other physicians Hospitalizations, surgeries, and ER visits in previous 12  months Vitals Screenings to include cognitive, depression, and falls Referrals and appointments  In addition, I have reviewed and discussed with patient certain preventive protocols, quality metrics, and best practice recommendations. A written personalized care plan for preventive services as well as general preventive health recommendations were provided to patient.     Barb Merino, LPN   [id number]   After Visit Summary: (Pick Up) Due to this being a telephonic visit, with patients personalized plan was offered to patient and patient has requested to Pick up at office.  Nurse Notes: none

## 2022-11-27 NOTE — Patient Instructions (Signed)
Eric Ross , Thank you for taking time to come for your Medicare Wellness Visit. I appreciate your ongoing commitment to your health goals. Please review the following plan we discussed and let me know if I can assist you in the future.   Referrals/Orders/Follow-Ups/Clinician Recommendations: none  Managing Pain Without Opioids Opioids are strong medicines used to treat moderate to severe pain. For some people, especially those who have long-term (chronic) pain, opioids may not be the best choice for pain management due to: Side effects like nausea, constipation, and sleepiness. The risk of addiction (opioid use disorder). The longer you take opioids, the greater your risk of addiction. Pain that lasts for more than 3 months is called chronic pain. Managing chronic pain usually requires more than one approach and is often provided by a team of health care providers working together (multidisciplinary approach). Pain management may be done at a pain management center or pain clinic. How to manage pain without the use of opioids Use non-opioid medicines Non-opioid medicines for pain may include: Over-the-counter or prescription non-steroidal anti-inflammatory drugs (NSAIDs). These may be the first medicines used for pain. They work well for muscle and bone pain, and they reduce swelling. Acetaminophen. This over-the-counter medicine may work well for milder pain but not swelling. Antidepressants. These may be used to treat chronic pain. A certain type of antidepressant (tricyclics) is often used. These medicines are given in lower doses for pain than when used for depression. Anticonvulsants. These are usually used to treat seizures but may also reduce nerve (neuropathic) pain. Muscle relaxants. These relieve pain caused by sudden muscle tightening (spasms). You may also use a pain medicine that is applied to the skin as a patch, cream, or gel (topical analgesic), such as a numbing medicine. These  may cause fewer side effects than medicines taken by mouth. Do certain therapies as directed Some therapies can help with pain management. They include: Physical therapy. You will do exercises to gain strength and flexibility. A physical therapist may teach you exercises to move and stretch parts of your body that are weak, stiff, or painful. You can learn these exercises at physical therapy visits and practice them at home. Physical therapy may also involve: Massage. Heat wraps or applying heat or cold to affected areas. Electrical signals that interrupt pain signals (transcutaneous electrical nerve stimulation, TENS). Weak lasers that reduce pain and swelling (low-level laser therapy). Signals from your body that help you learn to regulate pain (biofeedback). Occupational therapy. This helps you to learn ways to function at home and work with less pain. Recreational therapy. This involves trying new activities or hobbies, such as a physical activity or drawing. Mental health therapy, including: Cognitive behavioral therapy (CBT). This helps you learn coping skills for dealing with pain. Acceptance and commitment therapy (ACT) to change the way you think and react to pain. Relaxation therapies, including muscle relaxation exercises and mindfulness-based stress reduction. Pain management counseling. This may be individual, family, or group counseling.  Receive medical treatments Medical treatments for pain management include: Nerve block injections. These may include a pain blocker and anti-inflammatory medicines. You may have injections: Near the spine to relieve chronic back or neck pain. Into joints to relieve back or joint pain. Into nerve areas that supply a painful area to relieve body pain. Into muscles (trigger point injections) to relieve some painful muscle conditions. A medical device placed near your spine to help block pain signals and relieve nerve pain or chronic back pain  (spinal  cord stimulation device). Acupuncture. Follow these instructions at home Medicines Take over-the-counter and prescription medicines only as told by your health care provider. If you are taking pain medicine, ask your health care providers about possible side effects to watch out for. Do not drive or use heavy machinery while taking prescription opioid pain medicine. Lifestyle  Do not use drugs or alcohol to reduce pain. If you drink alcohol, limit how much you have to: 0-1 drink a day for women who are not pregnant. 0-2 drinks a day for men. Know how much alcohol is in a drink. In the [place] one drink equals one 12 oz bottle of beer (355 mL), one 5 oz glass of wine (148 mL), or one 1 oz glass of hard liquor (44 mL). Do not use any products that contain nicotine or tobacco. These products include cigarettes, chewing tobacco, and vaping devices, such as e-cigarettes. If you need help quitting, ask your health care provider. Eat a healthy diet and maintain a healthy weight. Poor diet and excess weight may make pain worse. Eat foods that are high in fiber. These include fresh fruits and vegetables, whole grains, and beans. Limit foods that are high in fat and processed sugars, such as fried and sweet foods. Exercise regularly. Exercise lowers stress and may help relieve pain. Ask your health care provider what activities and exercises are safe for you. If your health care provider approves, join an exercise class that combines movement and stress reduction. Examples include yoga and tai chi. Get enough sleep. Lack of sleep may make pain worse. Lower stress as much as possible. Practice stress reduction techniques as told by your therapist. General instructions Work with all your pain management providers to find the treatments that work best for you. You are an important member of your pain management team. There are many things you can do to reduce pain on your own. Consider joining an  online or in-person support group for people who have chronic pain. Keep all follow-up visits. This is important. Where to find more information You can find more information about managing pain without opioids from: American Academy of Pain Medicine: painmed.org Institute for Chronic Pain: instituteforchronicpain.org American Chronic Pain Association: theacpa.org Contact a health care provider if: You have side effects from pain medicine. Your pain gets worse or does not get better with treatments or home therapy. You are struggling with anxiety or depression. Summary Many types of pain can be managed without opioids. Chronic pain may respond better to pain management without opioids. Pain is best managed when you and a team of health care providers work together. Pain management without opioids may include non-opioid medicines, medical treatments, physical therapy, mental health therapy, and lifestyle changes. Tell your health care providers if your pain gets worse or is not being managed well enough. This information is not intended to replace advice given to you by your health care provider. Make sure you discuss any questions you have with your health care provider. Document Revised: 04/27/2020 Document Reviewed: 04/27/2020 Elsevier Patient Education  2024 Elsevier Inc.   This is a list of the screening recommended for you and due dates:  Health Maintenance  Topic Date Due   DTaP/Tdap/Td vaccine (2 - Tdap) 04/25/2016   COVID-19 Vaccine (1) 12/07/2022*   Zoster (Shingles) Vaccine (1 of 2) 02/27/2023*   Flu Shot  04/29/2023*   Pneumonia Vaccine (1 of 1 - PCV) 11/27/2023*   Medicare Annual Wellness Visit  11/27/2023   Cologuard (Stool DNA  test)  06/30/2025   Hepatitis C Screening  Completed   HPV Vaccine  Aged Out   Colon Cancer Screening  Discontinued  *Topic was postponed. The date shown is not the original due date.    Advanced directives: (ACP Link)Information on Advanced  Care Planning can be found at [facility] Advance Health Care Directives Advance Health Care Directives ([email or web address]   Next Medicare Annual Wellness Visit scheduled for next year: Yes  insert Preventive Care attachment Insert FALL PREVENTION attachment if needed

## 2022-12-06 DIAGNOSIS — M5136 Other intervertebral disc degeneration, lumbar region with discogenic back pain only: Secondary | ICD-10-CM | POA: Diagnosis not present

## 2022-12-17 DIAGNOSIS — M51361 Other intervertebral disc degeneration, lumbar region with lower extremity pain only: Secondary | ICD-10-CM | POA: Diagnosis not present

## 2022-12-17 DIAGNOSIS — G8929 Other chronic pain: Secondary | ICD-10-CM | POA: Diagnosis not present

## 2022-12-17 DIAGNOSIS — Z79891 Long term (current) use of opiate analgesic: Secondary | ICD-10-CM | POA: Diagnosis not present

## 2022-12-17 DIAGNOSIS — M25562 Pain in left knee: Secondary | ICD-10-CM | POA: Diagnosis not present

## 2022-12-17 DIAGNOSIS — M545 Low back pain, unspecified: Secondary | ICD-10-CM | POA: Diagnosis not present

## 2022-12-17 DIAGNOSIS — Z79899 Other long term (current) drug therapy: Secondary | ICD-10-CM | POA: Diagnosis not present

## 2022-12-17 DIAGNOSIS — G894 Chronic pain syndrome: Secondary | ICD-10-CM | POA: Diagnosis not present

## 2022-12-17 DIAGNOSIS — M25532 Pain in left wrist: Secondary | ICD-10-CM | POA: Diagnosis not present

## 2022-12-17 DIAGNOSIS — M47817 Spondylosis without myelopathy or radiculopathy, lumbosacral region: Secondary | ICD-10-CM | POA: Diagnosis not present

## 2022-12-17 DIAGNOSIS — M25561 Pain in right knee: Secondary | ICD-10-CM | POA: Diagnosis not present

## 2022-12-18 ENCOUNTER — Telehealth: Payer: Self-pay | Admitting: *Deleted

## 2022-12-18 NOTE — Telephone Encounter (Signed)
Pharmacy please advise on holding warfarin prior to spinal cord stimulator trial scheduled for TBD. Thank you.

## 2022-12-18 NOTE — Telephone Encounter (Signed)
   Pre-operative Risk Assessment    Patient Name: Eric Ross  DOB: [birth year] MRN: [id number]  DATE OF LAST VISIT: 06/15/21 DAYNA DUNN, PAC DATE OF NEXT VISIT: NONE    Request for Surgical Clearance    Procedure:   SPINAL CORD STIMULATOR TRIAL  Date of Surgery:  Clearance TBD                                 Surgeon:  NOT LISTED Surgeon's Group or Practice Name:  [facility] Phone number:  [phone] Fax number:  [phone]   Type of Clearance Requested:   - Pharmacy:  Hold Warfarin (Coumadin) x 7 FULL DAYS PRIOR TO PROCEDURE DATE AND THE DURING THE WHOLE TIME OF THE TRIAL WHICH LAST 5-7 DAYS   Type of Anesthesia:  Not Indicated   Additional requests/questions:    Elpidio Anis   12/18/2022, 11:40 AM

## 2022-12-18 NOTE — Telephone Encounter (Signed)
Patient with diagnosis of afib on warfarin for anticoagulation.    Procedure: SPINAL CORD STIMULATOR TRIAL  Date of procedure: TBD   CHA2DS2-VASc Score = 5   This indicates a 7.2% annual risk of stroke. The patient's score is based upon: CHF History: 1 HTN History: 1 Diabetes History: 0 Stroke History: 2 Vascular Disease History: 0 Age Score: 1 Gender Score: 0      Given patients hx of stroke and the request for patient to be off anticoagulation for 5-7 after stimulator is placed, will need to defer to MD.   **This guidance is not considered finalized until pre-operative APP has relayed final recommendations.**

## 2022-12-20 NOTE — Telephone Encounter (Signed)
Left message for surgeon's office to call back per pharm-D:  Olene Floss, RPH-CPP  Runell Gess, MD; Cv Div Preop Callback1 hour ago (10:17 AM)   Dr. Allyson Sabal,  Would need to confirm with surgeon, but I think they will agree with lovenox prior to procedure, but I think they want him off all anticoag for 5-7 days after.  Preop can we see if patient can resume lovenox post op day 1?

## 2022-12-20 NOTE — Telephone Encounter (Signed)
Surgeon's office called back with answer for the pharm-D in regard to Lovenox post op.  Per surgeon's office the pt will not be able to be on ANY blood thinner post op due to the leads.   I thanked her for the call and will update the preop team and pharm-d

## 2022-12-21 NOTE — Telephone Encounter (Signed)
Called requesting office but no answer. Waited on hold in the que for 15 mins

## 2022-12-25 NOTE — Telephone Encounter (Addendum)
   Patient Name: [name]  DOB: 12/16/1955 MRN: [id number]  Primary Cardiologist: Nanetta Batty, MD  Chart reviewed as part of pre-operative protocol coverage. Pre-op clearance already addressed by colleagues in earlier phone notes. To summarize recommendations:  Per surgeon's office patient will not be able to be on any blood thinners postop due to the leads including Lovenox which we had highly recommended.  In regards to this, Dr. Allyson Sabal replied:  -That's up to the surgeon and Eric Ross (patient) but he's had a debilitating CVA in the past and is at risk of a thromboembolic event off of this anti coagulation. -Dr. Allyson Sabal   Will route this bundled recommendation to requesting provider via Epic fax function and remove from pre-op pool. Please call with questions.  Sharlene Dory, PA-C 12/25/2022, 12:44 PM

## 2023-01-01 ENCOUNTER — Other Ambulatory Visit: Payer: Self-pay

## 2023-01-01 DIAGNOSIS — I4821 Permanent atrial fibrillation: Secondary | ICD-10-CM

## 2023-01-02 ENCOUNTER — Ambulatory Visit (INDEPENDENT_AMBULATORY_CARE_PROVIDER_SITE_OTHER): Payer: Self-pay

## 2023-01-02 ENCOUNTER — Other Ambulatory Visit: Payer: Self-pay

## 2023-01-02 DIAGNOSIS — Z7901 Long term (current) use of anticoagulants: Secondary | ICD-10-CM

## 2023-01-02 DIAGNOSIS — Z8673 Personal history of transient ischemic attack (TIA), and cerebral infarction without residual deficits: Secondary | ICD-10-CM

## 2023-01-02 DIAGNOSIS — I4821 Permanent atrial fibrillation: Secondary | ICD-10-CM

## 2023-01-02 LAB — PROTIME-INR
INR: 2.7 — ABNORMAL HIGH (ref 0.9–1.2)
Prothrombin Time: 27.8 s — ABNORMAL HIGH (ref 9.1–12.0)

## 2023-01-02 NOTE — Patient Instructions (Signed)
Description   Spoke with wife and advised pt to continue taking warfarin 1 tablet (5 mg) daily except 1/2 tablet (2.5 mg) each Monday, Wednesday and Friday.  Repeat INR in 6 weeks at [facility] (has standing order). [phone]      

## 2023-01-14 DIAGNOSIS — Z79891 Long term (current) use of opiate analgesic: Secondary | ICD-10-CM | POA: Diagnosis not present

## 2023-01-14 DIAGNOSIS — G894 Chronic pain syndrome: Secondary | ICD-10-CM | POA: Diagnosis not present

## 2023-01-14 DIAGNOSIS — M47817 Spondylosis without myelopathy or radiculopathy, lumbosacral region: Secondary | ICD-10-CM | POA: Diagnosis not present

## 2023-01-14 DIAGNOSIS — Z79899 Other long term (current) drug therapy: Secondary | ICD-10-CM | POA: Diagnosis not present

## 2023-01-14 DIAGNOSIS — M545 Low back pain, unspecified: Secondary | ICD-10-CM | POA: Diagnosis not present

## 2023-01-14 DIAGNOSIS — M25562 Pain in left knee: Secondary | ICD-10-CM | POA: Diagnosis not present

## 2023-01-14 DIAGNOSIS — G8929 Other chronic pain: Secondary | ICD-10-CM | POA: Diagnosis not present

## 2023-01-14 DIAGNOSIS — M25532 Pain in left wrist: Secondary | ICD-10-CM | POA: Diagnosis not present

## 2023-01-14 DIAGNOSIS — M51361 Other intervertebral disc degeneration, lumbar region with lower extremity pain only: Secondary | ICD-10-CM | POA: Diagnosis not present

## 2023-01-14 DIAGNOSIS — M25561 Pain in right knee: Secondary | ICD-10-CM | POA: Diagnosis not present

## 2023-01-24 IMAGING — CR DG CHEST 2V
2 series · 2 of 2 positions shown · non-contrast
Comparison: Chest radiograph 11/14/2008

CLINICAL DATA: Preoperative evaluation.

EXAM:
CHEST - 2 VIEW

[w chest pa]
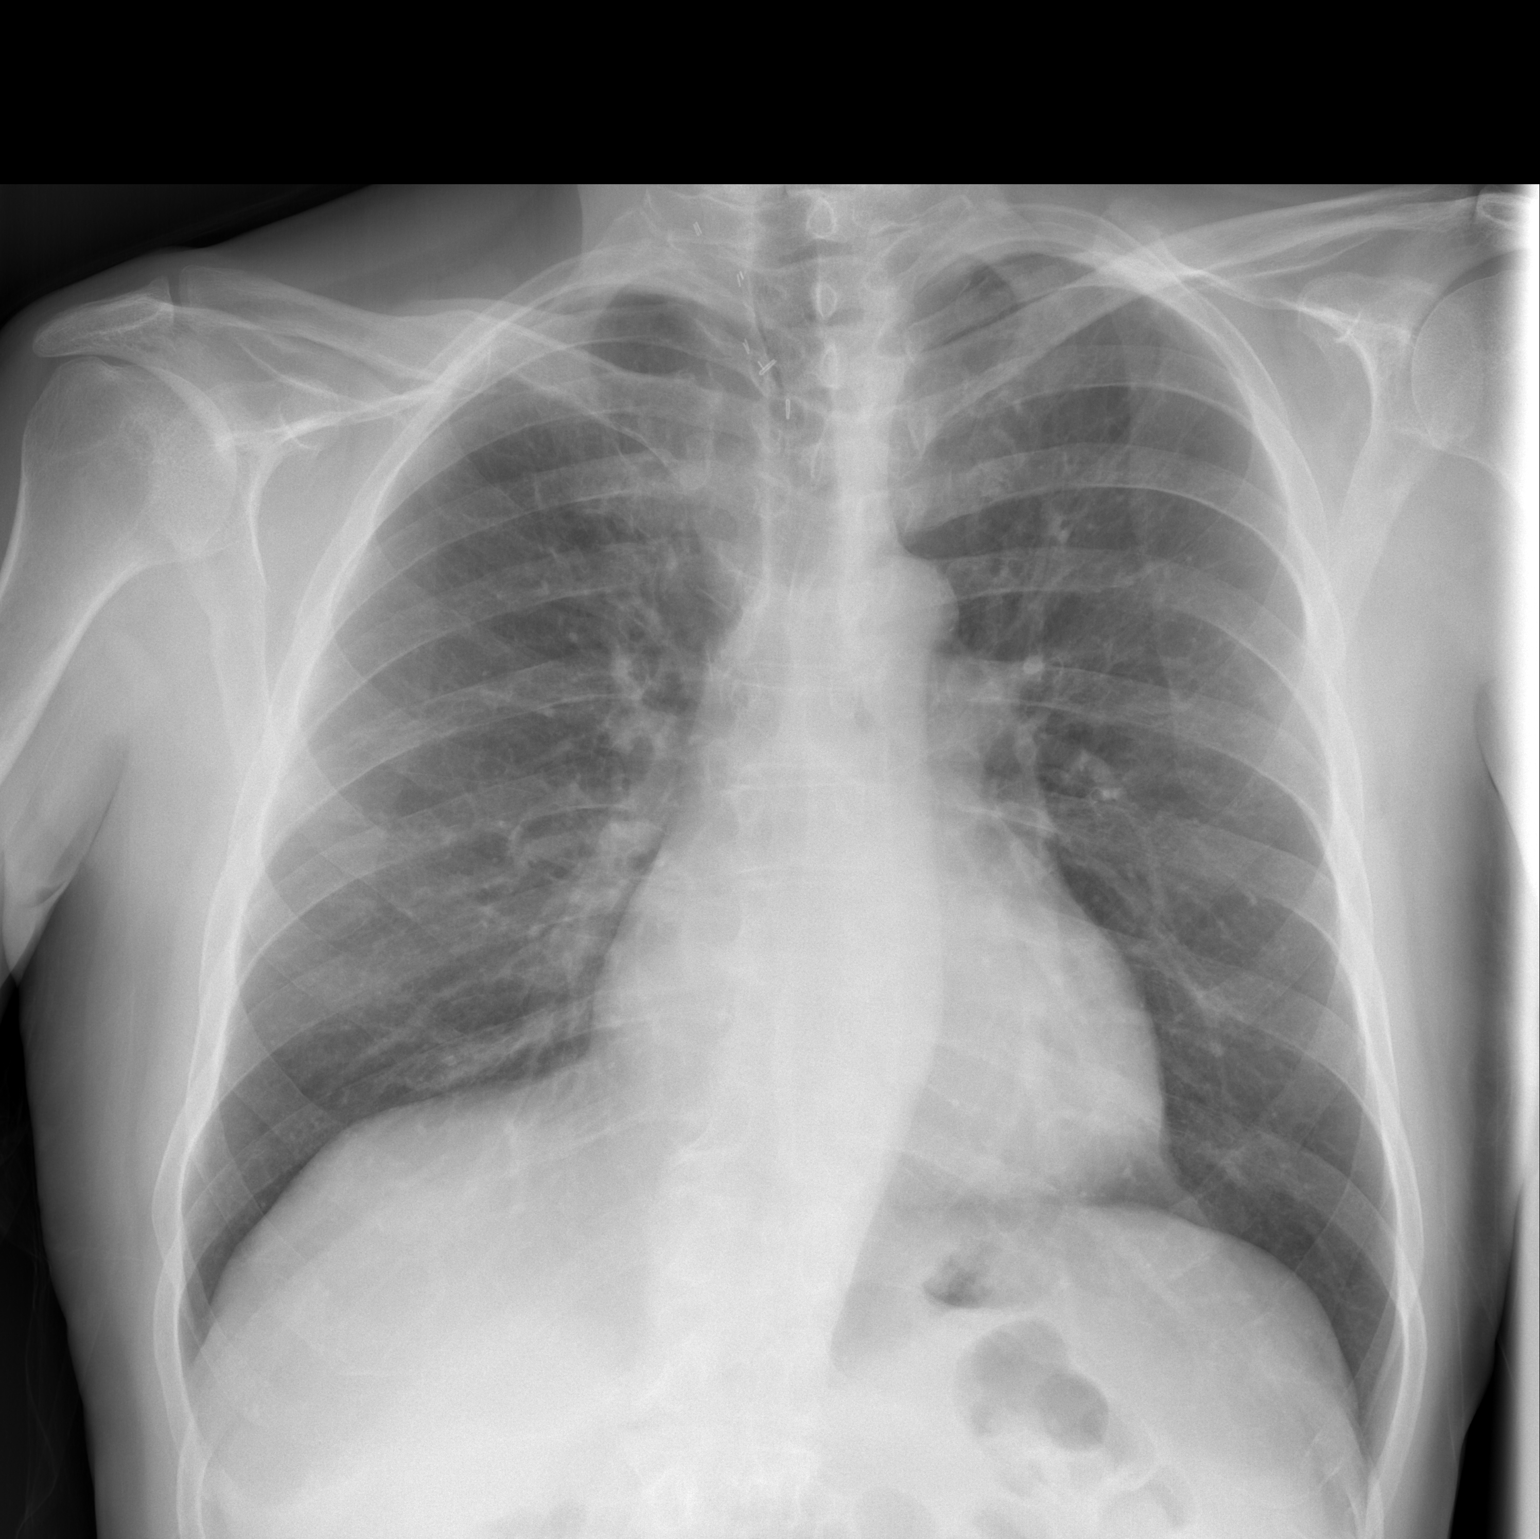

[w chest lat]
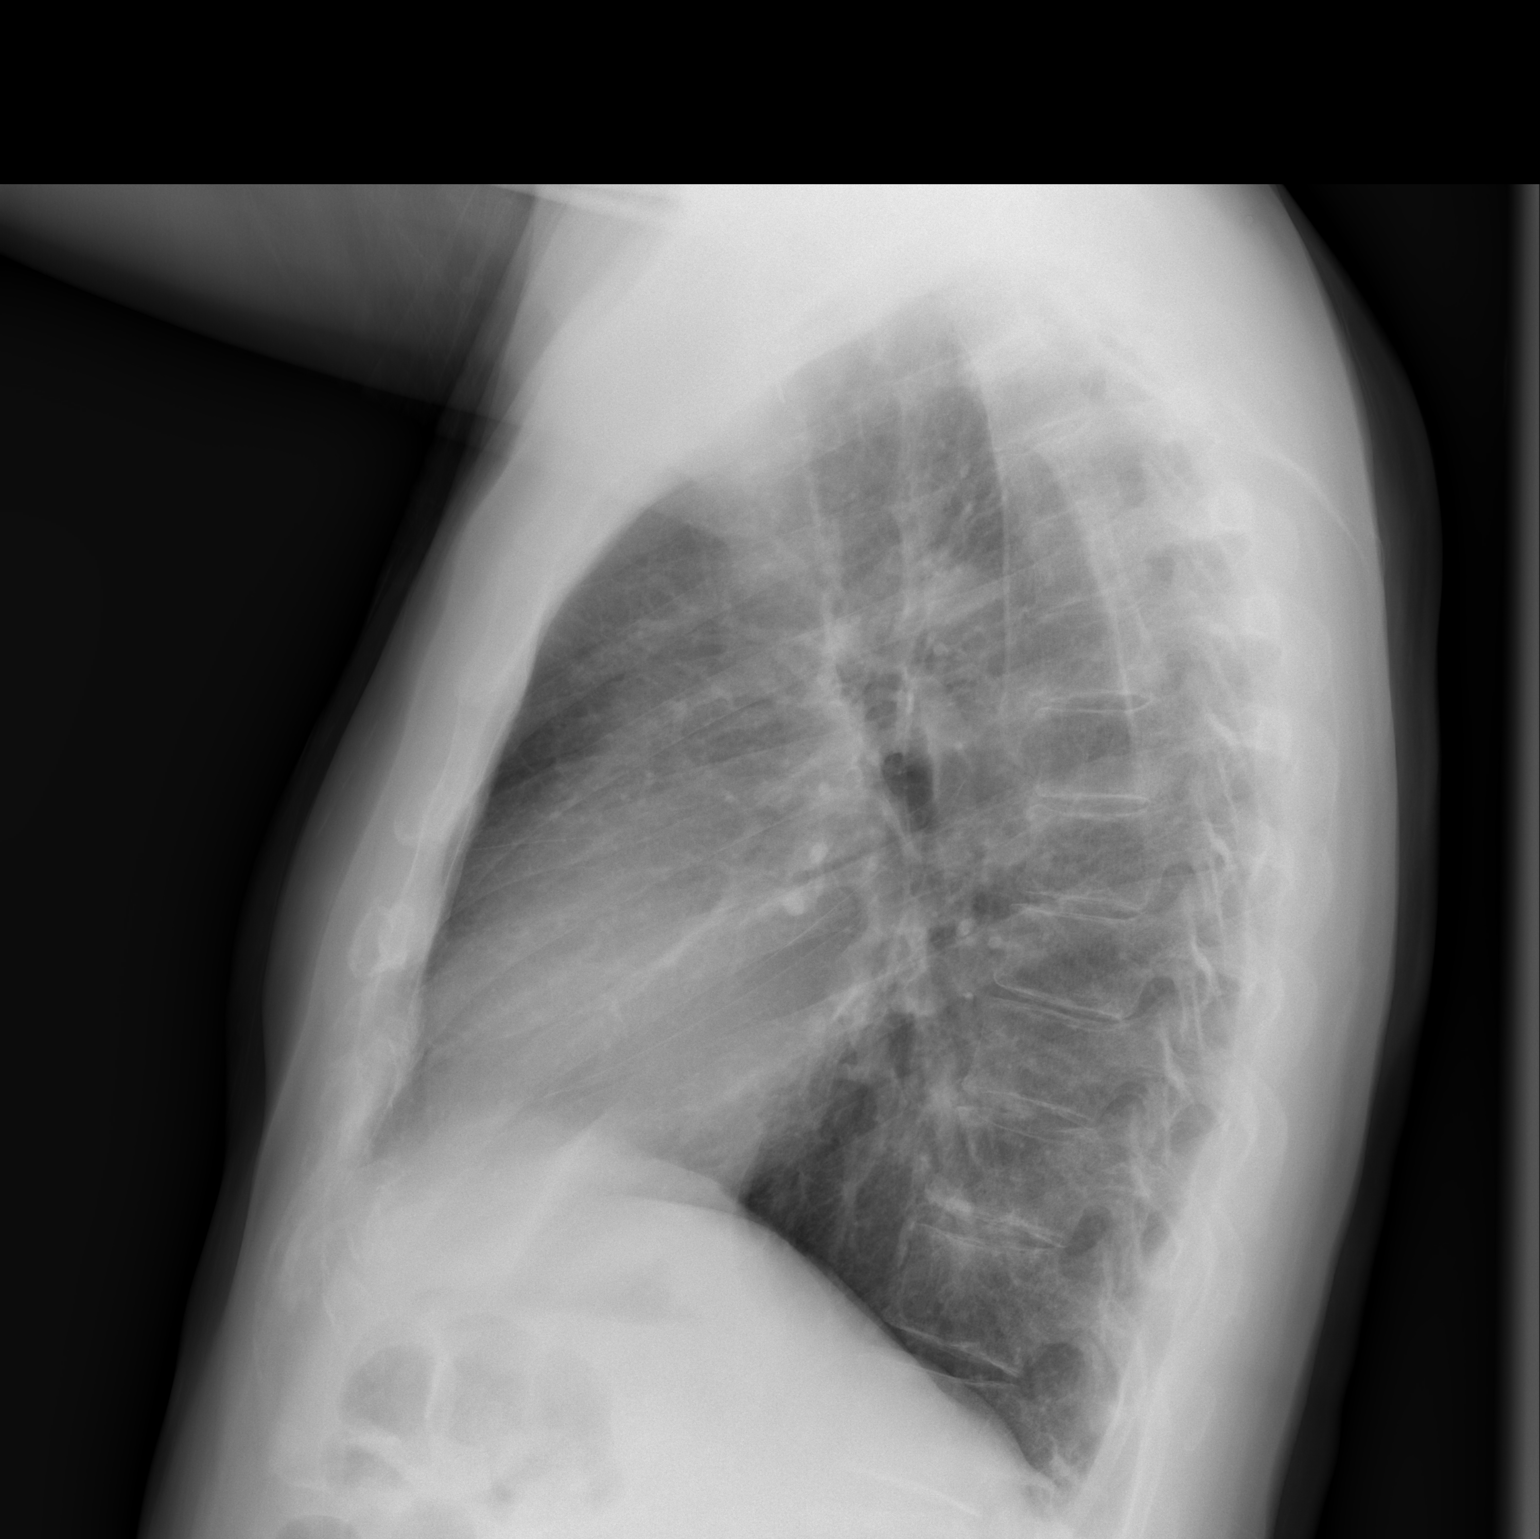

[2 of 2 positions shown; findings below may reference images not displayed]

FINDINGS: The heart size and mediastinal contours are within normal limits.
Both lungs are clear. The visualized skeletal structures are
unremarkable.
IMPRESSION: No active cardiopulmonary disease.

## 2023-02-12 ENCOUNTER — Other Ambulatory Visit: Payer: Self-pay

## 2023-02-12 DIAGNOSIS — I4821 Permanent atrial fibrillation: Secondary | ICD-10-CM | POA: Diagnosis not present

## 2023-02-13 ENCOUNTER — Ambulatory Visit (INDEPENDENT_AMBULATORY_CARE_PROVIDER_SITE_OTHER): Payer: PPO | Admitting: Cardiovascular Disease

## 2023-02-13 ENCOUNTER — Telehealth: Payer: Self-pay

## 2023-02-13 DIAGNOSIS — Z8673 Personal history of transient ischemic attack (TIA), and cerebral infarction without residual deficits: Secondary | ICD-10-CM

## 2023-02-13 DIAGNOSIS — Z7901 Long term (current) use of anticoagulants: Secondary | ICD-10-CM

## 2023-02-13 DIAGNOSIS — I4821 Permanent atrial fibrillation: Secondary | ICD-10-CM

## 2023-02-13 LAB — PROTIME-INR
INR: 2 — ABNORMAL HIGH (ref 0.9–1.2)
Prothrombin Time: 21.1 s — ABNORMAL HIGH (ref 9.1–12.0)

## 2023-02-13 NOTE — Patient Instructions (Signed)
 Description   Spoke with wife and advised pt to continue taking warfarin 1 tablet (5 mg) daily except 1/2 tablet (2.5 mg) each Monday, Wednesday and Friday.  Repeat INR in 6 weeks at [facility] (has standing order), please release order. [phone]

## 2023-02-13 NOTE — Telephone Encounter (Signed)
Lpmtcb to discuss INR result.

## 2023-02-18 DIAGNOSIS — M25561 Pain in right knee: Secondary | ICD-10-CM | POA: Diagnosis not present

## 2023-02-18 DIAGNOSIS — M47817 Spondylosis without myelopathy or radiculopathy, lumbosacral region: Secondary | ICD-10-CM | POA: Diagnosis not present

## 2023-02-18 DIAGNOSIS — Z79891 Long term (current) use of opiate analgesic: Secondary | ICD-10-CM | POA: Diagnosis not present

## 2023-02-18 DIAGNOSIS — M25532 Pain in left wrist: Secondary | ICD-10-CM | POA: Diagnosis not present

## 2023-02-18 DIAGNOSIS — G894 Chronic pain syndrome: Secondary | ICD-10-CM | POA: Diagnosis not present

## 2023-02-18 DIAGNOSIS — M545 Low back pain, unspecified: Secondary | ICD-10-CM | POA: Diagnosis not present

## 2023-02-18 DIAGNOSIS — G8929 Other chronic pain: Secondary | ICD-10-CM | POA: Diagnosis not present

## 2023-02-18 DIAGNOSIS — M25562 Pain in left knee: Secondary | ICD-10-CM | POA: Diagnosis not present

## 2023-02-18 DIAGNOSIS — M51361 Other intervertebral disc degeneration, lumbar region with lower extremity pain only: Secondary | ICD-10-CM | POA: Diagnosis not present

## 2023-02-18 DIAGNOSIS — Z79899 Other long term (current) drug therapy: Secondary | ICD-10-CM | POA: Diagnosis not present

## 2023-03-18 DIAGNOSIS — M25561 Pain in right knee: Secondary | ICD-10-CM | POA: Diagnosis not present

## 2023-03-18 DIAGNOSIS — Z79891 Long term (current) use of opiate analgesic: Secondary | ICD-10-CM | POA: Diagnosis not present

## 2023-03-18 DIAGNOSIS — M25532 Pain in left wrist: Secondary | ICD-10-CM | POA: Diagnosis not present

## 2023-03-18 DIAGNOSIS — M51361 Other intervertebral disc degeneration, lumbar region with lower extremity pain only: Secondary | ICD-10-CM | POA: Diagnosis not present

## 2023-03-18 DIAGNOSIS — G894 Chronic pain syndrome: Secondary | ICD-10-CM | POA: Diagnosis not present

## 2023-03-18 DIAGNOSIS — M25562 Pain in left knee: Secondary | ICD-10-CM | POA: Diagnosis not present

## 2023-03-18 DIAGNOSIS — M47817 Spondylosis without myelopathy or radiculopathy, lumbosacral region: Secondary | ICD-10-CM | POA: Diagnosis not present

## 2023-03-18 DIAGNOSIS — Z79899 Other long term (current) drug therapy: Secondary | ICD-10-CM | POA: Diagnosis not present

## 2023-03-18 DIAGNOSIS — M545 Low back pain, unspecified: Secondary | ICD-10-CM | POA: Diagnosis not present

## 2023-03-18 DIAGNOSIS — G8929 Other chronic pain: Secondary | ICD-10-CM | POA: Diagnosis not present

## 2023-03-26 ENCOUNTER — Other Ambulatory Visit: Payer: Self-pay

## 2023-03-26 DIAGNOSIS — I4821 Permanent atrial fibrillation: Secondary | ICD-10-CM

## 2023-03-27 ENCOUNTER — Ambulatory Visit (INDEPENDENT_AMBULATORY_CARE_PROVIDER_SITE_OTHER): Payer: Self-pay

## 2023-03-27 DIAGNOSIS — Z5181 Encounter for therapeutic drug level monitoring: Secondary | ICD-10-CM

## 2023-03-27 DIAGNOSIS — I4821 Permanent atrial fibrillation: Secondary | ICD-10-CM

## 2023-03-27 LAB — PROTIME-INR
INR: 2.5 — ABNORMAL HIGH (ref 0.9–1.2)
Prothrombin Time: 26.6 s — ABNORMAL HIGH (ref 9.1–12.0)

## 2023-03-27 NOTE — Patient Instructions (Signed)
 Description   Spoke with wife and advised pt to continue taking warfarin 1 tablet (5 mg) daily except 1/2 tablet (2.5 mg) each Monday, Wednesday and Friday.  Repeat INR in 6 weeks at [facility] (has standing order), please release order. [phone]

## 2023-04-15 DIAGNOSIS — M51361 Other intervertebral disc degeneration, lumbar region with lower extremity pain only: Secondary | ICD-10-CM | POA: Diagnosis not present

## 2023-04-15 DIAGNOSIS — G8929 Other chronic pain: Secondary | ICD-10-CM | POA: Diagnosis not present

## 2023-04-15 DIAGNOSIS — M25532 Pain in left wrist: Secondary | ICD-10-CM | POA: Diagnosis not present

## 2023-04-15 DIAGNOSIS — G894 Chronic pain syndrome: Secondary | ICD-10-CM | POA: Diagnosis not present

## 2023-04-15 DIAGNOSIS — M545 Low back pain, unspecified: Secondary | ICD-10-CM | POA: Diagnosis not present

## 2023-04-15 DIAGNOSIS — Z79891 Long term (current) use of opiate analgesic: Secondary | ICD-10-CM | POA: Diagnosis not present

## 2023-04-15 DIAGNOSIS — M47817 Spondylosis without myelopathy or radiculopathy, lumbosacral region: Secondary | ICD-10-CM | POA: Diagnosis not present

## 2023-04-15 DIAGNOSIS — M25562 Pain in left knee: Secondary | ICD-10-CM | POA: Diagnosis not present

## 2023-04-15 DIAGNOSIS — Z79899 Other long term (current) drug therapy: Secondary | ICD-10-CM | POA: Diagnosis not present

## 2023-04-15 DIAGNOSIS — M25561 Pain in right knee: Secondary | ICD-10-CM | POA: Diagnosis not present

## 2023-05-07 ENCOUNTER — Telehealth: Payer: Self-pay | Admitting: *Deleted

## 2023-05-07 ENCOUNTER — Telehealth: Payer: Self-pay

## 2023-05-07 ENCOUNTER — Other Ambulatory Visit: Payer: Self-pay | Admitting: *Deleted

## 2023-05-07 DIAGNOSIS — I4821 Permanent atrial fibrillation: Secondary | ICD-10-CM

## 2023-05-07 NOTE — Telephone Encounter (Signed)
 Called pt/wife and spoke with wife and advised INR is due today. She states she will take him today and asked if the order was there. Advised I released an order and it should be there and if any issues to call us directly (she states she had the number) and she verbalized understanding.

## 2023-05-07 NOTE — Telephone Encounter (Signed)
 Lpm to have INR drawn.

## 2023-05-08 ENCOUNTER — Ambulatory Visit (INDEPENDENT_AMBULATORY_CARE_PROVIDER_SITE_OTHER): Admitting: *Deleted

## 2023-05-08 DIAGNOSIS — Z8673 Personal history of transient ischemic attack (TIA), and cerebral infarction without residual deficits: Secondary | ICD-10-CM

## 2023-05-08 DIAGNOSIS — Z7901 Long term (current) use of anticoagulants: Secondary | ICD-10-CM

## 2023-05-08 DIAGNOSIS — I4821 Permanent atrial fibrillation: Secondary | ICD-10-CM

## 2023-05-08 LAB — PROTIME-INR
INR: 2.9 — ABNORMAL HIGH (ref 0.9–1.2)
Prothrombin Time: 30.3 s — ABNORMAL HIGH (ref 9.1–12.0)

## 2023-05-08 NOTE — Patient Instructions (Signed)
 Description   Spoke with wife and advised pt to continue taking warfarin 1 tablet (5 mg) daily except 1/2 tablet (2.5 mg) each Monday, Wednesday and Friday.  Repeat INR in 6 weeks at [facility] (has standing order), please release order. [phone]

## 2023-05-13 DIAGNOSIS — M25561 Pain in right knee: Secondary | ICD-10-CM | POA: Diagnosis not present

## 2023-05-13 DIAGNOSIS — M545 Low back pain, unspecified: Secondary | ICD-10-CM | POA: Diagnosis not present

## 2023-05-13 DIAGNOSIS — Z79891 Long term (current) use of opiate analgesic: Secondary | ICD-10-CM | POA: Diagnosis not present

## 2023-05-13 DIAGNOSIS — M25532 Pain in left wrist: Secondary | ICD-10-CM | POA: Diagnosis not present

## 2023-05-13 DIAGNOSIS — M51361 Other intervertebral disc degeneration, lumbar region with lower extremity pain only: Secondary | ICD-10-CM | POA: Diagnosis not present

## 2023-05-13 DIAGNOSIS — M47817 Spondylosis without myelopathy or radiculopathy, lumbosacral region: Secondary | ICD-10-CM | POA: Diagnosis not present

## 2023-05-13 DIAGNOSIS — G894 Chronic pain syndrome: Secondary | ICD-10-CM | POA: Diagnosis not present

## 2023-05-13 DIAGNOSIS — G8929 Other chronic pain: Secondary | ICD-10-CM | POA: Diagnosis not present

## 2023-05-13 DIAGNOSIS — M25562 Pain in left knee: Secondary | ICD-10-CM | POA: Diagnosis not present

## 2023-05-13 DIAGNOSIS — Z79899 Other long term (current) drug therapy: Secondary | ICD-10-CM | POA: Diagnosis not present

## 2023-05-17 ENCOUNTER — Telehealth (HOSPITAL_BASED_OUTPATIENT_CLINIC_OR_DEPARTMENT_OTHER): Payer: Self-pay | Admitting: *Deleted

## 2023-05-17 NOTE — Telephone Encounter (Signed)
   Name: Eric Ross  DOB: 1955/04/27  MRN: [id number]  Primary Cardiologist: Dorn Lesches, MD  Chart reviewed as part of pre-operative protocol coverage. Because of TANAV ORSAK past medical history and time since last visit, he will require a follow-up in-office visit in order to better assess preoperative cardiovascular risk.  Patient will also need updated labs prior to visit. CBC and BMET.  Pre-op covering staff: - Please schedule appointment and call patient to inform them. If patient already had an upcoming appointment within acceptable timeframe, please add pre-op clearance to the appointment notes so provider is aware. - Please contact requesting surgeon's office via preferred method (i.e, phone, fax) to inform them of need for appointment prior to surgery.   Wyn Raddle, Jackee Shove, NP  05/17/2023, 10:54 AM

## 2023-05-17 NOTE — Telephone Encounter (Signed)
   Pre-operative Risk Assessment    Patient Name: Eric Ross  DOB: [birth year] MRN: [id number]   Date of last office visit: 11/22/2021 Date of next office visit: None  Request for Surgical Clearance    Procedure:   Spinal Cord Stimulator Trial lasting 5-7 days.  Date of Surgery:  Clearance TBD                                 Surgeon:  Non Indicated Surgeon's Group or Practice Name:  [facility]  Phone number:  [phone] Fax number:  [phone]   Type of Clearance Requested:   - Medical  - Pharmacy:  Hold Warfarin (Coumadin ) 7 days prior   Type of Anesthesia:  Not Indicated   Additional requests/questions:    Signed, Edsel Grayce Sanders   05/17/2023, 10:49 AM

## 2023-05-17 NOTE — Telephone Encounter (Signed)
 Left message to call back and schedule IN OFFICE PREOP APPT.

## 2023-05-22 ENCOUNTER — Encounter: Payer: Medicare HMO | Admitting: Family Medicine

## 2023-05-23 NOTE — Telephone Encounter (Signed)
 Pt has been scheduled to see Dr. Katheryne Pane 06/12/23, clearance will be addressed at that time.   Will route to the requesting surgeon's office to make them aware.

## 2023-05-30 ENCOUNTER — Encounter: Admitting: Family Medicine

## 2023-06-10 DIAGNOSIS — M25532 Pain in left wrist: Secondary | ICD-10-CM | POA: Diagnosis not present

## 2023-06-10 DIAGNOSIS — M25562 Pain in left knee: Secondary | ICD-10-CM | POA: Diagnosis not present

## 2023-06-10 DIAGNOSIS — M7918 Myalgia, other site: Secondary | ICD-10-CM | POA: Diagnosis not present

## 2023-06-10 DIAGNOSIS — M47817 Spondylosis without myelopathy or radiculopathy, lumbosacral region: Secondary | ICD-10-CM | POA: Diagnosis not present

## 2023-06-10 DIAGNOSIS — M25561 Pain in right knee: Secondary | ICD-10-CM | POA: Diagnosis not present

## 2023-06-10 DIAGNOSIS — Z79891 Long term (current) use of opiate analgesic: Secondary | ICD-10-CM | POA: Diagnosis not present

## 2023-06-10 DIAGNOSIS — G894 Chronic pain syndrome: Secondary | ICD-10-CM | POA: Diagnosis not present

## 2023-06-10 DIAGNOSIS — Z79899 Other long term (current) drug therapy: Secondary | ICD-10-CM | POA: Diagnosis not present

## 2023-06-10 DIAGNOSIS — M545 Low back pain, unspecified: Secondary | ICD-10-CM | POA: Diagnosis not present

## 2023-06-10 DIAGNOSIS — G8929 Other chronic pain: Secondary | ICD-10-CM | POA: Diagnosis not present

## 2023-06-10 DIAGNOSIS — M51361 Other intervertebral disc degeneration, lumbar region with lower extremity pain only: Secondary | ICD-10-CM | POA: Diagnosis not present

## 2023-06-10 NOTE — Telephone Encounter (Signed)
 Pt spouse called in stating pt is no longer having this surgery

## 2023-06-12 ENCOUNTER — Ambulatory Visit: Admitting: Cardiovascular Disease

## 2023-06-12 ENCOUNTER — Encounter: Admitting: Family Medicine

## 2023-06-12 DIAGNOSIS — I10 Essential (primary) hypertension: Secondary | ICD-10-CM

## 2023-06-12 DIAGNOSIS — I428 Other cardiomyopathies: Secondary | ICD-10-CM

## 2023-06-12 DIAGNOSIS — Z96651 Presence of right artificial knee joint: Secondary | ICD-10-CM

## 2023-06-12 DIAGNOSIS — E038 Other specified hypothyroidism: Secondary | ICD-10-CM

## 2023-06-12 DIAGNOSIS — J301 Allergic rhinitis due to pollen: Secondary | ICD-10-CM

## 2023-06-12 DIAGNOSIS — Z79891 Long term (current) use of opiate analgesic: Secondary | ICD-10-CM

## 2023-06-12 DIAGNOSIS — M5136 Other intervertebral disc degeneration, lumbar region with discogenic back pain only: Secondary | ICD-10-CM

## 2023-06-12 DIAGNOSIS — I69351 Hemiplegia and hemiparesis following cerebral infarction affecting right dominant side: Secondary | ICD-10-CM

## 2023-06-12 DIAGNOSIS — Z7901 Long term (current) use of anticoagulants: Secondary | ICD-10-CM

## 2023-06-12 DIAGNOSIS — Z8673 Personal history of transient ischemic attack (TIA), and cerebral infarction without residual deficits: Secondary | ICD-10-CM

## 2023-06-12 DIAGNOSIS — K219 Gastro-esophageal reflux disease without esophagitis: Secondary | ICD-10-CM

## 2023-06-12 DIAGNOSIS — Z2821 Immunization not carried out because of patient refusal: Secondary | ICD-10-CM

## 2023-06-12 DIAGNOSIS — R4701 Aphasia: Secondary | ICD-10-CM

## 2023-06-12 DIAGNOSIS — I4811 Longstanding persistent atrial fibrillation: Secondary | ICD-10-CM

## 2023-06-12 DIAGNOSIS — N5201 Erectile dysfunction due to arterial insufficiency: Secondary | ICD-10-CM

## 2023-06-18 ENCOUNTER — Telehealth: Payer: Self-pay | Admitting: *Deleted

## 2023-06-18 DIAGNOSIS — I4821 Permanent atrial fibrillation: Secondary | ICD-10-CM

## 2023-06-18 NOTE — Telephone Encounter (Signed)
 Called pt/wife to remind that INR is due today. Wife states they will go today to get it done when they go out.

## 2023-06-20 DIAGNOSIS — I4821 Permanent atrial fibrillation: Secondary | ICD-10-CM | POA: Diagnosis not present

## 2023-06-20 LAB — PROTIME-INR
INR: 2 — ABNORMAL HIGH (ref 0.9–1.2)
Prothrombin Time: 20.9 s — ABNORMAL HIGH (ref 9.1–12.0)

## 2023-06-21 ENCOUNTER — Ambulatory Visit: Payer: Self-pay | Admitting: Cardiovascular Disease

## 2023-06-21 ENCOUNTER — Ambulatory Visit (INDEPENDENT_AMBULATORY_CARE_PROVIDER_SITE_OTHER): Payer: Self-pay | Admitting: *Deleted

## 2023-06-21 DIAGNOSIS — I4821 Permanent atrial fibrillation: Secondary | ICD-10-CM | POA: Diagnosis not present

## 2023-06-21 DIAGNOSIS — Z8673 Personal history of transient ischemic attack (TIA), and cerebral infarction without residual deficits: Secondary | ICD-10-CM | POA: Diagnosis not present

## 2023-06-21 DIAGNOSIS — Z7901 Long term (current) use of anticoagulants: Secondary | ICD-10-CM

## 2023-06-21 NOTE — Patient Instructions (Addendum)
 Description   PT NEEDS CARDIOLOGY APPT LAST SEEN 11/22/21-transferred to [facility] with wife and advised pt to continue taking warfarin 1 tablet (5 mg) daily except 1/2 tablet (2.5 mg) each Monday, Wednesday and Friday.  Repeat INR in 6 weeks at [facility] (has standing order), please release order. [phone]

## 2023-07-07 ENCOUNTER — Other Ambulatory Visit: Payer: Self-pay | Admitting: Family Medicine

## 2023-07-07 DIAGNOSIS — K219 Gastro-esophageal reflux disease without esophagitis: Secondary | ICD-10-CM

## 2023-07-11 DIAGNOSIS — M25532 Pain in left wrist: Secondary | ICD-10-CM | POA: Diagnosis not present

## 2023-07-11 DIAGNOSIS — M25562 Pain in left knee: Secondary | ICD-10-CM | POA: Diagnosis not present

## 2023-07-11 DIAGNOSIS — G894 Chronic pain syndrome: Secondary | ICD-10-CM | POA: Diagnosis not present

## 2023-07-11 DIAGNOSIS — M51361 Other intervertebral disc degeneration, lumbar region with lower extremity pain only: Secondary | ICD-10-CM | POA: Diagnosis not present

## 2023-07-11 DIAGNOSIS — G8929 Other chronic pain: Secondary | ICD-10-CM | POA: Diagnosis not present

## 2023-07-11 DIAGNOSIS — M545 Low back pain, unspecified: Secondary | ICD-10-CM | POA: Diagnosis not present

## 2023-07-11 DIAGNOSIS — M7918 Myalgia, other site: Secondary | ICD-10-CM | POA: Diagnosis not present

## 2023-07-11 DIAGNOSIS — Z79891 Long term (current) use of opiate analgesic: Secondary | ICD-10-CM | POA: Diagnosis not present

## 2023-07-11 DIAGNOSIS — M25561 Pain in right knee: Secondary | ICD-10-CM | POA: Diagnosis not present

## 2023-07-11 DIAGNOSIS — Z79899 Other long term (current) drug therapy: Secondary | ICD-10-CM | POA: Diagnosis not present

## 2023-07-11 DIAGNOSIS — M47817 Spondylosis without myelopathy or radiculopathy, lumbosacral region: Secondary | ICD-10-CM | POA: Diagnosis not present

## 2023-07-31 ENCOUNTER — Encounter: Payer: Self-pay | Admitting: Cardiovascular Disease

## 2023-07-31 ENCOUNTER — Ambulatory Visit: Attending: Cardiology | Admitting: Cardiovascular Disease

## 2023-07-31 VITALS — BP 118/80 | HR 73 | Resp 16 | Ht 73.0 in | Wt 176.2 lb

## 2023-07-31 DIAGNOSIS — E782 Mixed hyperlipidemia: Secondary | ICD-10-CM | POA: Diagnosis not present

## 2023-07-31 DIAGNOSIS — I4821 Permanent atrial fibrillation: Secondary | ICD-10-CM

## 2023-07-31 DIAGNOSIS — I10 Essential (primary) hypertension: Secondary | ICD-10-CM | POA: Diagnosis not present

## 2023-07-31 DIAGNOSIS — I428 Other cardiomyopathies: Secondary | ICD-10-CM | POA: Diagnosis not present

## 2023-07-31 DIAGNOSIS — R42 Dizziness and giddiness: Secondary | ICD-10-CM

## 2023-07-31 NOTE — Assessment & Plan Note (Signed)
 Patient has had episodic dizziness for unclear reasons.  I did do an event monitor on him 01/31/2018 that showed A-fib with a variable ventricular response and short runs of NSVT.  He does not feel that his symptoms have gotten worse enough to necessitate a repeat Zio patch at this time.

## 2023-07-31 NOTE — Patient Instructions (Signed)
 Medication Instructions:  The current medical regimen is effective;  continue present plan and medications.  *If you need a refill on your cardiac medications before your next appointment, please call your pharmacy*  Follow-Up: At [facility], you and your health needs are our priority.  As part of our continuing mission to provide you with exceptional heart care, our providers are all part of one team.  This team includes your primary Cardiologist (physician) and Advanced Practice Providers or APPs (Physician Assistants and Nurse Practitioners) who all work together to provide you with the care you need, when you need it.  Your next appointment:   1 year(s)  Provider:   Dorn Lesches, MD    We recommend signing up for the patient portal called MyChart.  Sign up information is provided on this After Visit Summary.  MyChart is used to connect with patients for Virtual Visits (Telemedicine).  Patients are able to view lab/test results, encounter notes, upcoming appointments, etc.  Non-urgent messages can be sent to your provider as well.   To learn more about what you can do with MyChart, go to [email or web address].

## 2023-07-31 NOTE — Assessment & Plan Note (Signed)
 History of hyperlipidemia on statin therapy with lipid profile performed 05/29/2022 revealing total cholesterol 163, LDL 94 and HDL 48.

## 2023-07-31 NOTE — Assessment & Plan Note (Signed)
 Cardiac catheterization by myself 10/09/2005 revealing left main/two-vessel disease.  History of obstructive sleep apnea currently not wearing her CPAP.  History of nonischemic cardiomyopathy with Performed by myself in 2003 revealing normal coronary arteries and ejection fraction has remained stable at 45% last checked 01/20/2018 on beta-blocker and ACE inhibitor.  He is asymptomatic.

## 2023-07-31 NOTE — Assessment & Plan Note (Signed)
 History of essential hypertension with blood pressure measured today at 118/80.  He is on carvedilol  and ramipril .

## 2023-07-31 NOTE — Assessment & Plan Note (Signed)
 History of persistent A-fib rate controlled on warfarin oral anticoagulation monitored here in our office.

## 2023-07-31 NOTE — Progress Notes (Signed)
 07/31/2023 TOWNSEND CUDWORTH   Nov 24, 1955  [id number]  Primary Physician Joyce Norleen BROCKS, MD Primary Cardiologist: Dorn JINNY Lesches MD [place], [place]  HPI:  Eric Ross is a 68 y.o.  thin appearing married Caucasian male father of 2, grandfather and 3 grandchildren who is accompanied by his wife [name] today. I last saw him 11/22/2021.[name] He has a history of nonischemic cardiomyopathy cath performed in 2003 that showed normal coronary arteries. He has an ejection fraction in the 45-50% range. He does have chronic A. Fib on Coumadin  anticoagulation rate controlled. His other problems include hyperlipidemia on statin therapy and treated hypertension. He does get occasional chest burning and a Myoview  stress test performed 05/30/11 which was normal. Since I saw him a year ago he is relatively asymptomatic. Unfortunately his mother-in-law who is 64 years old lives with them as phone and broken several bones. Franky has been taking care of her while she is living with them.    did refer him to Dr. Inocencio because of dizziness who adjusted his carvedilol .  His symptoms improved.  He was having some atypical chest pain and had a recent Myoview  stress test performed 08/17/2020 which was nonischemic.  His EF is in the 45% range.     He had a uncomplicated right total knee replacement by Dr. Liam in May 2023 which he has recovered from.  Otherwise he is remained cardiovascular stable.  He denies chest pain or shortness of breath.  He does have episodic dizziness for unclear reasons.  An event monitor performed 02/27/2018 showed persistent A-fib with variable ventricular response and short runs of nonsustained VT.  He remains on Coumadin  oral anticoagulation.  Current Meds  Medication Sig   acetaminophen  (TYLENOL ) 500 MG tablet Take 500-1,000 mg by mouth every 6 (six) hours as needed (headaches.).   Aspirin-Salicylamide-Caffeine (BC FAST PAIN RELIEF) 650-195-33.3 MG PACK Take 1 packet by mouth  daily as needed (pain.).   atorvastatin  (LIPITOR) 40 MG tablet Take 1 tablet (40 mg total) by mouth daily.   carvedilol  (COREG ) 25 MG tablet TAKE 1.5 TABLETS (37.5 MG TOTAL) BY MOUTH TWICE A DAY   cetirizine (ZYRTEC) 10 MG tablet Take 10 mg by mouth daily as needed for allergies.   diclofenac  Sodium (VOLTAREN ) 1 % GEL Apply 4 g topically 2 (two) times daily as needed (pain.).   gabapentin (NEURONTIN) 300 MG capsule SMARTSIG:1 Capsule(s) By Mouth Every Evening   levothyroxine  (SYNTHROID ) 112 MCG tablet Take 1 tablet (112 mcg total) by mouth daily.   oxyCODONE -acetaminophen  (PERCOCET/ROXICET) 5-325 MG tablet Take 1 tablet by mouth every 4 (four) hours as needed for severe pain.   pantoprazole  (PROTONIX ) 40 MG tablet TAKE 1 TABLET BY MOUTH EVERY DAY   ramipril  (ALTACE ) 2.5 MG capsule Take 1 capsule (2.5 mg total) by mouth daily.   tadalafil  (CIALIS ) 20 MG tablet Take 1 tablet (20 mg total) by mouth daily as needed for erectile dysfunction.   tiZANidine  (ZANAFLEX ) 2 MG tablet Take 1 tablet (2 mg total) by mouth every 6 (six) hours as needed.   warfarin (COUMADIN ) 5 MG tablet TAKE 1/2 TO 1 TABLET DAILY AS PRESCRIBED BY COUMADIN  CLINIC     Allergies  Allergen Reactions   Penicillins     REACTION: Paralyzed him @ 68 yrs old    Social History   Socioeconomic History   Marital status: Married    Spouse name: Not on file   Number of children: Not on file  Years of education: Not on file   Highest education level: Not on file  Occupational History   Not on file  Tobacco Use   Smoking status: Never   Smokeless tobacco: Current    Types: Chew  Vaping Use   Vaping status: Never Used  Substance and Sexual Activity   Alcohol use: No   Drug use: Yes    Types: Oxycodone    Sexual activity: Yes  Other Topics Concern   Not on file  Social History Narrative   Not on file   Social Drivers of Health   Financial Resource Strain: Low Risk  (11/27/2022)   Overall Financial Resource Strain  (CARDIA)    Difficulty of Paying Living Expenses: Not hard at all  Food Insecurity: No Food Insecurity (11/27/2022)   Hunger Vital Sign    Worried About Running Out of Food in the Last Year: Never true    Ran Out of Food in the Last Year: Never true  Transportation Needs: No Transportation Needs (11/27/2022)   PRAPARE - Administrator, Civil Service (Medical): No    Lack of Transportation (Non-Medical): No  Physical Activity: Inactive (11/27/2022)   Exercise Vital Sign    Days of Exercise per Week: 0 days    Minutes of Exercise per Session: 0 min  Stress: No Stress Concern Present (11/27/2022)   [employer] of Occupational Health - Occupational Stress Questionnaire    Feeling of Stress : Not at all  Recent Concern: Stress - Stress Concern Present (11/21/2022)   [employer] of Occupational Health - Occupational Stress Questionnaire    Feeling of Stress : Rather much  Social Connections: Socially Isolated (11/27/2022)   Social Connection and Isolation Panel    Frequency of Communication with Friends and Family: Never    Frequency of Social Gatherings with Friends and Family: Never    Attends Religious Services: Never    Database administrator or Organizations: No    Attends Banker Meetings: Never    Marital Status: Married  Catering manager Violence: Not At Risk (11/27/2022)   Humiliation, Afraid, Rape, and Kick questionnaire    Fear of Current or Ex-Partner: No    Emotionally Abused: No    Physically Abused: No    Sexually Abused: No     Review of Systems: General: negative for chills, fever, night sweats or weight changes.  Cardiovascular: negative for chest pain, dyspnea on exertion, edema, orthopnea, palpitations, paroxysmal nocturnal dyspnea or shortness of breath Dermatological: negative for rash Respiratory: negative for cough or wheezing Urologic: negative for hematuria Abdominal: negative for nausea, vomiting, diarrhea, bright red  blood per rectum, melena, or hematemesis Neurologic: negative for visual changes, syncope, or dizziness All other systems reviewed and are otherwise negative except as noted above.    Blood pressure 118/80, pulse 73, resp. rate 16, height 6' 1 (1.854 m), weight 176 lb 3.2 oz (79.9 kg), SpO2 94%.  General appearance: alert and no distress Neck: no adenopathy, no carotid bruit, no JVD, supple, symmetrical, trachea midline, and thyroid  not enlarged, symmetric, no tenderness/mass/nodules Lungs: clear to auscultation bilaterally Heart: irregularly irregular rhythm Extremities: extremities normal, atraumatic, no cyanosis or edema Pulses: 2+ and symmetric Skin: Skin color, texture, turgor normal. No rashes or lesions Neurologic: Grossly normal  EKG EKG Interpretation Date/Time:  Wednesday July 31 2023 11:03:42 EDT Ventricular Rate:  72 PR Interval:    QRS Duration:  98 QT Interval:  404 QTC Calculation: 442 R Axis:   50  Text Interpretation: Atrial fibrillation When compared with ECG of 27-Aug-2019 10:55, Vent. rate has decreased BY  46 BPM Confirmed by Court Carrier [id number]) on 07/31/2023 11:06:21 AM    ASSESSMENT AND PLAN:   Essential hypertension History of essential hypertension with blood pressure measured today at 118/80.  He is on carvedilol  and ramipril .  Atrial fibrillation (HCC) History of persistent A-fib rate controlled on warfarin oral anticoagulation monitored here in our office.  Mixed hyperlipidemia History of hyperlipidemia on statin therapy with lipid profile performed 05/29/2022 revealing total cholesterol 163, LDL 94 and HDL 48.  Nonischemic cardiomyopathy (HCC) Cardiac catheterization by myself 10/09/2005 revealing left main/two-vessel disease.  History of obstructive sleep apnea currently not wearing her CPAP.  History of nonischemic cardiomyopathy with Performed by myself in 2003 revealing normal coronary arteries and ejection fraction has remained stable at 45%  last checked 01/20/2018 on beta-blocker and ACE inhibitor.  He is asymptomatic.  Dizziness Patient has had episodic dizziness for unclear reasons.  I did do an event monitor on him 01/31/2018 that showed A-fib with a variable ventricular response and short runs of NSVT.  He does not feel that his symptoms have gotten worse enough to necessitate a repeat Zio patch at this time.     Carrier DOROTHA Court MD FACP,FACC,FAHA, [facility] 07/31/2023 11:17 AM

## 2023-08-05 ENCOUNTER — Other Ambulatory Visit: Payer: Self-pay | Admitting: *Deleted

## 2023-08-05 ENCOUNTER — Telehealth: Payer: Self-pay | Admitting: *Deleted

## 2023-08-05 DIAGNOSIS — I4821 Permanent atrial fibrillation: Secondary | ICD-10-CM | POA: Diagnosis not present

## 2023-08-05 NOTE — Telephone Encounter (Signed)
 Called pt/wife since INR is overdue; wife states he will be going in about 30 minutes to the lab to have INR done. Will await and follow up.

## 2023-08-06 ENCOUNTER — Telehealth: Payer: Self-pay | Admitting: Cardiovascular Disease

## 2023-08-06 ENCOUNTER — Ambulatory Visit (INDEPENDENT_AMBULATORY_CARE_PROVIDER_SITE_OTHER): Admitting: *Deleted

## 2023-08-06 ENCOUNTER — Ambulatory Visit: Payer: Self-pay | Admitting: Cardiovascular Disease

## 2023-08-06 DIAGNOSIS — Z8673 Personal history of transient ischemic attack (TIA), and cerebral infarction without residual deficits: Secondary | ICD-10-CM

## 2023-08-06 DIAGNOSIS — I4821 Permanent atrial fibrillation: Secondary | ICD-10-CM | POA: Diagnosis not present

## 2023-08-06 DIAGNOSIS — Z7901 Long term (current) use of anticoagulants: Secondary | ICD-10-CM

## 2023-08-06 LAB — PROTIME-INR
INR: 2.5 — ABNORMAL HIGH (ref 0.9–1.2)
Prothrombin Time: 26.6 s — ABNORMAL HIGH (ref 9.1–12.0)

## 2023-08-06 NOTE — Patient Instructions (Addendum)
 Description   Spoke with wife, [name] and advised pt to continue taking warfarin 1 tablet (5 mg) daily except 1/2 tablet (2.5 mg) each Monday, Wednesday and Friday.  Repeat INR in 6 weeks at [facility] (has standing order), please release order. [phone]

## 2023-08-06 NOTE — Telephone Encounter (Signed)
 Wife reports pt has been having HAs since they picked up his Carvedilol  Rx 6/8.  She further states that medication manufacturer was changed and wants another Rx sent in so that pt can receive the old manufacturer.  Informed wife that we have not sent in a Rx for pt since last year.  Further explained that we do not control the manufacturer used by the pharmacy and she will need to speak with them.  I called and spoke to pharmacist who states manufacturer has not been changed.  She is going to call wife and discuss this more. I called and followed up with wife, who confirms she spoke to pharmacy.  States they are going to give pt a new batch to see if improves symptom issues.  Advised to call us  back if we needed to address this again.  Wife verbalized understanding and agreeable to plan.

## 2023-08-06 NOTE — Progress Notes (Signed)
Please see anticoagulation encounter.

## 2023-08-06 NOTE — Telephone Encounter (Signed)
 Pt c/o medication issue:  1. Name of Medication:   carvedilol  (COREG ) 25 MG tablet   2. How are you currently taking this medication (dosage and times per day)?   Currently not taking alternate brand, using previous medication  3. Are you having a reaction (difficulty breathing--STAT)?   4. What is your medication issue?   Wife [place]) stated patient received a different brand of this medication and it gives him headaches.  Wife wants a new prescription for the original medication brand sent to [id number] - [place], [place] - [address]

## 2023-08-08 DIAGNOSIS — M25532 Pain in left wrist: Secondary | ICD-10-CM | POA: Diagnosis not present

## 2023-08-08 DIAGNOSIS — M51361 Other intervertebral disc degeneration, lumbar region with lower extremity pain only: Secondary | ICD-10-CM | POA: Diagnosis not present

## 2023-08-08 DIAGNOSIS — Z79891 Long term (current) use of opiate analgesic: Secondary | ICD-10-CM | POA: Diagnosis not present

## 2023-08-08 DIAGNOSIS — M25562 Pain in left knee: Secondary | ICD-10-CM | POA: Diagnosis not present

## 2023-08-08 DIAGNOSIS — M25561 Pain in right knee: Secondary | ICD-10-CM | POA: Diagnosis not present

## 2023-08-08 DIAGNOSIS — Z79899 Other long term (current) drug therapy: Secondary | ICD-10-CM | POA: Diagnosis not present

## 2023-08-08 DIAGNOSIS — M545 Low back pain, unspecified: Secondary | ICD-10-CM | POA: Diagnosis not present

## 2023-08-08 DIAGNOSIS — M47817 Spondylosis without myelopathy or radiculopathy, lumbosacral region: Secondary | ICD-10-CM | POA: Diagnosis not present

## 2023-08-08 DIAGNOSIS — M7918 Myalgia, other site: Secondary | ICD-10-CM | POA: Diagnosis not present

## 2023-08-08 DIAGNOSIS — G8929 Other chronic pain: Secondary | ICD-10-CM | POA: Diagnosis not present

## 2023-08-08 DIAGNOSIS — G894 Chronic pain syndrome: Secondary | ICD-10-CM | POA: Diagnosis not present

## 2023-08-19 ENCOUNTER — Other Ambulatory Visit: Payer: Self-pay | Admitting: Family Medicine

## 2023-08-19 DIAGNOSIS — N529 Male erectile dysfunction, unspecified: Secondary | ICD-10-CM

## 2023-09-05 DIAGNOSIS — G8929 Other chronic pain: Secondary | ICD-10-CM | POA: Diagnosis not present

## 2023-09-05 DIAGNOSIS — M7918 Myalgia, other site: Secondary | ICD-10-CM | POA: Diagnosis not present

## 2023-09-05 DIAGNOSIS — M545 Low back pain, unspecified: Secondary | ICD-10-CM | POA: Diagnosis not present

## 2023-09-05 DIAGNOSIS — M47817 Spondylosis without myelopathy or radiculopathy, lumbosacral region: Secondary | ICD-10-CM | POA: Diagnosis not present

## 2023-09-05 DIAGNOSIS — M25561 Pain in right knee: Secondary | ICD-10-CM | POA: Diagnosis not present

## 2023-09-05 DIAGNOSIS — M25562 Pain in left knee: Secondary | ICD-10-CM | POA: Diagnosis not present

## 2023-09-05 DIAGNOSIS — Z79899 Other long term (current) drug therapy: Secondary | ICD-10-CM | POA: Diagnosis not present

## 2023-09-05 DIAGNOSIS — M51361 Other intervertebral disc degeneration, lumbar region with lower extremity pain only: Secondary | ICD-10-CM | POA: Diagnosis not present

## 2023-09-05 DIAGNOSIS — Z79891 Long term (current) use of opiate analgesic: Secondary | ICD-10-CM | POA: Diagnosis not present

## 2023-09-05 DIAGNOSIS — M25532 Pain in left wrist: Secondary | ICD-10-CM | POA: Diagnosis not present

## 2023-09-05 DIAGNOSIS — G894 Chronic pain syndrome: Secondary | ICD-10-CM | POA: Diagnosis not present

## 2023-09-16 ENCOUNTER — Other Ambulatory Visit: Payer: Self-pay

## 2023-09-16 DIAGNOSIS — I4821 Permanent atrial fibrillation: Secondary | ICD-10-CM | POA: Diagnosis not present

## 2023-09-17 ENCOUNTER — Ambulatory Visit (INDEPENDENT_AMBULATORY_CARE_PROVIDER_SITE_OTHER): Admitting: Cardiology

## 2023-09-17 ENCOUNTER — Ambulatory Visit: Payer: Self-pay | Admitting: Cardiovascular Disease

## 2023-09-17 DIAGNOSIS — Z8673 Personal history of transient ischemic attack (TIA), and cerebral infarction without residual deficits: Secondary | ICD-10-CM

## 2023-09-17 DIAGNOSIS — Z7901 Long term (current) use of anticoagulants: Secondary | ICD-10-CM

## 2023-09-17 LAB — PROTIME-INR
INR: 3.3 — ABNORMAL HIGH (ref 0.9–1.2)
Prothrombin Time: 32.9 s — ABNORMAL HIGH (ref 9.1–12.0)

## 2023-09-17 NOTE — Progress Notes (Signed)
 INR 3.3  Please see anticoagulation encounter Spoke with wife, [name] and advised pt to take 0.5 tablet today only then  continue taking warfarin 1 tablet (5 mg) daily except 1/2 tablet (2.5 mg) each Monday, Wednesday and Friday.  Repeat INR in 6 weeks at [facility] (has standing order), please release order. [phone]

## 2023-10-04 ENCOUNTER — Other Ambulatory Visit: Payer: Self-pay | Admitting: Family Medicine

## 2023-10-04 DIAGNOSIS — E039 Hypothyroidism, unspecified: Secondary | ICD-10-CM

## 2023-10-10 DIAGNOSIS — M25561 Pain in right knee: Secondary | ICD-10-CM | POA: Diagnosis not present

## 2023-10-10 DIAGNOSIS — M7918 Myalgia, other site: Secondary | ICD-10-CM | POA: Diagnosis not present

## 2023-10-10 DIAGNOSIS — M25532 Pain in left wrist: Secondary | ICD-10-CM | POA: Diagnosis not present

## 2023-10-10 DIAGNOSIS — Z79899 Other long term (current) drug therapy: Secondary | ICD-10-CM | POA: Diagnosis not present

## 2023-10-10 DIAGNOSIS — G8929 Other chronic pain: Secondary | ICD-10-CM | POA: Diagnosis not present

## 2023-10-10 DIAGNOSIS — M51361 Other intervertebral disc degeneration, lumbar region with lower extremity pain only: Secondary | ICD-10-CM | POA: Diagnosis not present

## 2023-10-10 DIAGNOSIS — M47817 Spondylosis without myelopathy or radiculopathy, lumbosacral region: Secondary | ICD-10-CM | POA: Diagnosis not present

## 2023-10-10 DIAGNOSIS — G894 Chronic pain syndrome: Secondary | ICD-10-CM | POA: Diagnosis not present

## 2023-10-10 DIAGNOSIS — M25562 Pain in left knee: Secondary | ICD-10-CM | POA: Diagnosis not present

## 2023-10-10 DIAGNOSIS — M545 Low back pain, unspecified: Secondary | ICD-10-CM | POA: Diagnosis not present

## 2023-10-10 DIAGNOSIS — Z79891 Long term (current) use of opiate analgesic: Secondary | ICD-10-CM | POA: Diagnosis not present

## 2023-10-28 ENCOUNTER — Telehealth: Payer: Self-pay | Admitting: *Deleted

## 2023-10-28 ENCOUNTER — Other Ambulatory Visit: Payer: Self-pay | Admitting: *Deleted

## 2023-10-28 DIAGNOSIS — I4821 Permanent atrial fibrillation: Secondary | ICD-10-CM | POA: Diagnosis not present

## 2023-10-28 NOTE — Telephone Encounter (Signed)
 Called patient and wife since INR is due today. Wife states they will get out there today. Order has been released.

## 2023-10-29 ENCOUNTER — Ambulatory Visit (INDEPENDENT_AMBULATORY_CARE_PROVIDER_SITE_OTHER): Admitting: *Deleted

## 2023-10-29 ENCOUNTER — Ambulatory Visit: Payer: Self-pay | Admitting: Cardiovascular Disease

## 2023-10-29 DIAGNOSIS — Z7901 Long term (current) use of anticoagulants: Secondary | ICD-10-CM

## 2023-10-29 DIAGNOSIS — Z8673 Personal history of transient ischemic attack (TIA), and cerebral infarction without residual deficits: Secondary | ICD-10-CM | POA: Diagnosis not present

## 2023-10-29 DIAGNOSIS — I4821 Permanent atrial fibrillation: Secondary | ICD-10-CM | POA: Diagnosis not present

## 2023-10-29 LAB — PROTIME-INR
INR: 2.3 — ABNORMAL HIGH (ref 0.9–1.2)
Prothrombin Time: 24.1 s — ABNORMAL HIGH (ref 9.1–12.0)

## 2023-10-29 NOTE — Patient Instructions (Signed)
 Description   INR-2.3; Spoke with wife, [name] and advised pt to continue taking warfarin 1 tablet (5mg ) daily except 1/2 tablet (2.5mg ) each Monday, Wednesday and Friday.  Repeat INR in 6 weeks at [facility] (has standing order), please release order. [phone]

## 2023-10-29 NOTE — Progress Notes (Signed)
 Description   INR-2.3; Spoke with wife, [name] and advised pt to continue taking warfarin 1 tablet (5mg ) daily except 1/2 tablet (2.5mg ) each Monday, Wednesday and Friday.  Repeat INR in 6 weeks at [facility] (has standing order), please release order. [phone]

## 2023-11-10 ENCOUNTER — Other Ambulatory Visit: Payer: Self-pay | Admitting: Family Medicine

## 2023-11-10 DIAGNOSIS — I428 Other cardiomyopathies: Secondary | ICD-10-CM

## 2023-11-11 DIAGNOSIS — M7918 Myalgia, other site: Secondary | ICD-10-CM | POA: Diagnosis not present

## 2023-11-11 DIAGNOSIS — M25562 Pain in left knee: Secondary | ICD-10-CM | POA: Diagnosis not present

## 2023-11-11 DIAGNOSIS — M47817 Spondylosis without myelopathy or radiculopathy, lumbosacral region: Secondary | ICD-10-CM | POA: Diagnosis not present

## 2023-11-11 DIAGNOSIS — G8929 Other chronic pain: Secondary | ICD-10-CM | POA: Diagnosis not present

## 2023-11-11 DIAGNOSIS — M25532 Pain in left wrist: Secondary | ICD-10-CM | POA: Diagnosis not present

## 2023-11-11 DIAGNOSIS — M545 Low back pain, unspecified: Secondary | ICD-10-CM | POA: Diagnosis not present

## 2023-11-11 DIAGNOSIS — Z79899 Other long term (current) drug therapy: Secondary | ICD-10-CM | POA: Diagnosis not present

## 2023-11-11 DIAGNOSIS — Z79891 Long term (current) use of opiate analgesic: Secondary | ICD-10-CM | POA: Diagnosis not present

## 2023-11-11 DIAGNOSIS — G894 Chronic pain syndrome: Secondary | ICD-10-CM | POA: Diagnosis not present

## 2023-11-11 DIAGNOSIS — M25561 Pain in right knee: Secondary | ICD-10-CM | POA: Diagnosis not present

## 2023-11-11 DIAGNOSIS — M51361 Other intervertebral disc degeneration, lumbar region with lower extremity pain only: Secondary | ICD-10-CM | POA: Diagnosis not present

## 2023-12-09 ENCOUNTER — Other Ambulatory Visit: Payer: Self-pay | Admitting: *Deleted

## 2023-12-09 DIAGNOSIS — Z79899 Other long term (current) drug therapy: Secondary | ICD-10-CM | POA: Diagnosis not present

## 2023-12-09 DIAGNOSIS — Z79891 Long term (current) use of opiate analgesic: Secondary | ICD-10-CM | POA: Diagnosis not present

## 2023-12-09 DIAGNOSIS — G894 Chronic pain syndrome: Secondary | ICD-10-CM | POA: Diagnosis not present

## 2023-12-09 DIAGNOSIS — I4821 Permanent atrial fibrillation: Secondary | ICD-10-CM

## 2023-12-09 DIAGNOSIS — M25561 Pain in right knee: Secondary | ICD-10-CM | POA: Diagnosis not present

## 2023-12-09 DIAGNOSIS — M25562 Pain in left knee: Secondary | ICD-10-CM | POA: Diagnosis not present

## 2023-12-09 DIAGNOSIS — M545 Low back pain, unspecified: Secondary | ICD-10-CM | POA: Diagnosis not present

## 2023-12-09 DIAGNOSIS — M47816 Spondylosis without myelopathy or radiculopathy, lumbar region: Secondary | ICD-10-CM | POA: Diagnosis not present

## 2023-12-09 DIAGNOSIS — G8929 Other chronic pain: Secondary | ICD-10-CM | POA: Diagnosis not present

## 2023-12-09 DIAGNOSIS — M47817 Spondylosis without myelopathy or radiculopathy, lumbosacral region: Secondary | ICD-10-CM | POA: Diagnosis not present

## 2023-12-09 DIAGNOSIS — M25532 Pain in left wrist: Secondary | ICD-10-CM | POA: Diagnosis not present

## 2023-12-09 DIAGNOSIS — M7918 Myalgia, other site: Secondary | ICD-10-CM | POA: Diagnosis not present

## 2023-12-09 NOTE — Progress Notes (Signed)
 Called patient since INR is due today, wife states he has another appointment today and will go by the lab afterwards. Will await and follow up.

## 2023-12-10 ENCOUNTER — Ambulatory Visit: Payer: Medicare HMO

## 2023-12-10 ENCOUNTER — Ambulatory Visit: Payer: Self-pay | Admitting: Cardiovascular Disease

## 2023-12-10 ENCOUNTER — Ambulatory Visit (INDEPENDENT_AMBULATORY_CARE_PROVIDER_SITE_OTHER): Payer: Self-pay

## 2023-12-10 DIAGNOSIS — Z8673 Personal history of transient ischemic attack (TIA), and cerebral infarction without residual deficits: Secondary | ICD-10-CM | POA: Diagnosis not present

## 2023-12-10 DIAGNOSIS — Z0000 Encounter for general adult medical examination without abnormal findings: Secondary | ICD-10-CM

## 2023-12-10 DIAGNOSIS — Z7901 Long term (current) use of anticoagulants: Secondary | ICD-10-CM

## 2023-12-10 LAB — PROTIME-INR
INR: 3.7 — ABNORMAL HIGH (ref 0.9–1.2)
Prothrombin Time: 36.6 s — ABNORMAL HIGH (ref 9.1–12.0)

## 2023-12-10 NOTE — Patient Instructions (Signed)
 Mr. Kucinski,  Thank you for taking the time for your Medicare Wellness Visit. I appreciate your continued commitment to your health goals. Please review the care plan we discussed, and feel free to reach out if I can assist you further.  Please note that Annual Wellness Visits do not include a physical exam. Some assessments may be limited, especially if the visit was conducted virtually. If needed, we may recommend an in-person follow-up with your provider.  Ongoing Care Seeing your primary care provider every 3 to 6 months helps us  monitor your health and provide consistent, personalized care.   Referrals If a referral was made during today's visit and you haven't received any updates within two weeks, please contact the referred provider directly to check on the status.  Recommended Screenings:  Health Maintenance  Topic Date Due   COVID-19 Vaccine (1) Never done   Zoster (Shingles) Vaccine (1 of 2) Never done   Pneumococcal Vaccine for age over 16 (1 of 1 - PCV) Never done   DTaP/Tdap/Td vaccine (2 - Tdap) 04/25/2016   Flu Shot  08/30/2023   Medicare Annual Wellness Visit  11/27/2023   Cologuard (Stool DNA test)  06/30/2025   Hepatitis C Screening  Completed   Meningitis B Vaccine  Aged Out   Colon Cancer Screening  Discontinued       12/10/2023   10:13 AM  Advanced Directives  Does Patient Have a Medical Advance Directive? No  Would patient like information on creating a medical advance directive? No - Patient declined    Vision: Annual vision screenings are recommended for early detection of glaucoma, cataracts, and diabetic retinopathy. These exams can also reveal signs of chronic conditions such as diabetes and high blood pressure.  Dental: Annual dental screenings help detect early signs of oral cancer, gum disease, and other conditions linked to overall health, including heart disease and diabetes.  Please see the attached documents for additional preventive care  recommendations.

## 2023-12-10 NOTE — Progress Notes (Signed)
 Subjective:   BLUE WINTHER is a 68 y.o. male who presents for a Medicare Annual Wellness Visit.  Allergies (verified) Penicillins   History: Past Medical History:  Diagnosis Date   Atrial fibrillation (HCC)    Dr. Court   Cardiomyopathy    nonischemic   Dyspnea    Erectile dysfunction    GERD (gastroesophageal reflux disease)    Headache    History of kidney stones    History of thyroid  cancer    Hyperlipidemia    Hypothyroidism    Pseudogout    Stroke (HCC) 01/29/2001   Thyroid  cancer [facility])    Past Surgical History:  Procedure Laterality Date   CARDIAC CATHETERIZATION  09/22/2001   NORMAL CORONARY ARTERIES   JOINT REPLACEMENT     BILATERAL KNEE    KNEE SURGERY Bilateral    LEXISCAN  MYOCARDIAL PERFUSION STUDY  05/30/2011   NORMAL MYOCARDIAL PERFUSION STUDY EF% 49%.   THYROIDECTOMY     TOTAL KNEE ARTHROPLASTY Right 06/19/2021   Procedure: RIGHT TOTAL KNEE ARTHROPLASTY;  Surgeon: Liam Lerner, MD;  Location: [facility];  Service: Orthopedics;  Laterality: Right;   TRANSTHORACIC ECHOCARDIOGRAM  05/30/2011   EF%=45-50%. NORMAL LV WALL THICKNESS. RV IS MILDLY DILATED. NO SIGNIFICANT VALVE DISEASE.    Family History  Problem Relation Age of Onset   Thyroid  cancer Mother    Cancer Sister    Stroke Brother    Social History   Occupational History   Not on file  Tobacco Use   Smoking status: Never   Smokeless tobacco: Current    Types: Chew  Vaping Use   Vaping status: Never Used  Substance and Sexual Activity   Alcohol use: No   Drug use: Yes    Types: Oxycodone    Sexual activity: Yes   Tobacco Counseling Ready to quit: Not Answered Counseling given: Not Answered  SDOH Screenings   Food Insecurity: No Food Insecurity (12/10/2023)  Housing: Unknown (12/10/2023)  Transportation Needs: No Transportation Needs (12/10/2023)  Utilities: Not At Risk (12/10/2023)  Alcohol Screen: Low Risk  (12/10/2023)  Depression (PHQ2-9): Low Risk  (12/10/2023)   Financial Resource Strain: Low Risk  (12/10/2023)  Physical Activity: Inactive (12/10/2023)  Social Connections: Moderately Isolated (12/10/2023)  Stress: No Stress Concern Present (12/10/2023)  Tobacco Use: High Risk (12/10/2023)  Health Literacy: Adequate Health Literacy (12/10/2023)   Depression Screen    12/10/2023   10:08 AM 11/27/2022   10:08 AM 12/01/2021   10:38 AM 10/31/2020    2:51 PM 10/30/2019    1:42 PM 07/22/2019   10:45 AM 08/08/2018    3:11 PM  PHQ 2/9 Scores  PHQ - 2 Score 0 0 0 0 0 0 0  PHQ- 9 Score 2 3  3           Data saved with a previous flowsheet row definition     Goals Addressed             This Visit's Progress    Patient Stated       12/10/2023, ease pain       Visit info / Clinical Intake: Medicare Wellness Visit Type:: Subsequent Annual Wellness Visit Persons participating in visit:: patient & caregiver Medicare Wellness Visit Mode:: Video Because this visit was a virtual/telehealth visit:: unable to obtan vitals due to lack of equipment If Telephone or Video please confirm:: I connected with the patient using audio enabled telemedicine application and verified that I am speaking with the correct person using two identifiers; I  discussed the limitations of evaluation and management by telemedicine; The patient expressed understanding and agreed to proceed Patient Location:: home Provider Location:: office Information given by:: patient; caregiver Interpreter Needed?: No Pre-visit prep was completed: yes AWV questionnaire completed by patient prior to visit?: no Living arrangements:: lives with spouse/significant other Patient's Overall Health Status Rating: good Typical amount of pain: (!) a lot Does pain affect daily life?: (!) yes Are you currently prescribed opioids?: (!) yes  Dietary Habits and Nutritional Risks How many meals a day?: 2 (sometimes 3) Eats fruit and vegetables daily?: yes Most meals are obtained by: preparing own  meals; eating out Diabetic:: no  Functional Status Activities of Daily Living (to include ambulation/medication): Independent Ambulation: Independent with device- listed below Home Assistive Devices/Equipment: Cane Medication Administration: Independent Home Management: Independent Manage your own finances?: yes Primary transportation is: family/friends Concerns about vision?: (!) yes (uses over the counter) Concerns about hearing?: no  Fall Screening Falls in the past year?: 1 (loses balance, tripped) Number of falls in past year: 1 Was there an injury with Fall?: 0 Fall Risk Category Calculator: 2 Patient Fall Risk Level: Moderate Fall Risk  Fall Risk Patient at Risk for Falls Due to: History of fall(s); Impaired balance/gait; Medication side effect Fall risk Follow up: Falls evaluation completed; Falls prevention discussed  Home and Transportation Safety: All rugs have non-skid backing?: N/A, no rugs All stairs or steps have railings?: yes Grab bars in the bathtub or shower?: yes Have non-skid surface in bathtub or shower?: yes Good home lighting?: yes Regular seat belt use?: yes Hospital stays in the last year:: no  Cognitive Assessment Difficulty concentrating, remembering, or making decisions? : no Will 6CIT or Mini Cog be Completed: no 6CIT or Mini Cog Declined: patient alert, oriented, able to answer questions appropriately and recall recent events (patient has epressive aphasia. Appears cognitive via video)  Advance Directives (For Healthcare) Does Patient Have a Medical Advance Directive?: No Would patient like information on creating a medical advance directive?: No - Patient declined  Reviewed/Updated  Reviewed/Updated: Reviewed All (Medical, Surgical, Family, Medications, Allergies, Care Teams, Patient Goals)        Objective:    Today's Vitals   There is no height or weight on file to calculate BMI.  Current Medications (verified) Outpatient  Encounter Medications as of 12/10/2023  Medication Sig   acetaminophen  (TYLENOL ) 500 MG tablet Take 500-1,000 mg by mouth every 6 (six) hours as needed (headaches.).   Aspirin-Salicylamide-Caffeine (BC FAST PAIN RELIEF) 650-195-33.3 MG PACK Take 1 packet by mouth daily as needed (pain.).   atorvastatin  (LIPITOR) 40 MG tablet Take 1 tablet (40 mg total) by mouth daily.   carvedilol  (COREG ) 25 MG tablet TAKE 1.5 TABLETS (37.5 MG TOTAL) BY MOUTH TWICE A DAY   cetirizine (ZYRTEC) 10 MG tablet Take 10 mg by mouth daily as needed for allergies.   diclofenac  Sodium (VOLTAREN ) 1 % GEL Apply 4 g topically 2 (two) times daily as needed (pain.).   gabapentin (NEURONTIN) 300 MG capsule SMARTSIG:1 Capsule(s) By Mouth Every Evening   HYDROcodone -acetaminophen  (NORCO) 10-325 MG tablet Take 1 tablet by mouth every 6 (six) hours as needed.   levothyroxine  (SYNTHROID ) 112 MCG tablet TAKE 1 TABLET BY MOUTH EVERY DAY   pantoprazole  (PROTONIX ) 40 MG tablet TAKE 1 TABLET BY MOUTH EVERY DAY   ramipril  (ALTACE ) 2.5 MG capsule Take 1 capsule (2.5 mg total) by mouth daily.   tadalafil  (CIALIS ) 20 MG tablet TAKE 1 TABLET BY MOUTH ONCE  DAILY AS NEEDED FOR ERECTILE DYSFUNCTION   tiZANidine  (ZANAFLEX ) 2 MG tablet Take 1 tablet (2 mg total) by mouth every 6 (six) hours as needed.   warfarin (COUMADIN ) 5 MG tablet TAKE 1/2 TO 1 TABLET DAILY AS PRESCRIBED BY COUMADIN  CLINIC   oxyCODONE -acetaminophen  (PERCOCET/ROXICET) 5-325 MG tablet Take 1 tablet by mouth every 4 (four) hours as needed for severe pain. (Patient not taking: Reported on 12/10/2023)   No facility-administered encounter medications on file as of 12/10/2023.   Hearing/Vision screen Hearing Screening - Comments:: Denies hearing issues Vision Screening - Comments:: No regular eye exams, Immunizations and Health Maintenance Health Maintenance  Topic Date Due   COVID-19 Vaccine (1) Never done   Zoster Vaccines- Shingrix (1 of 2) Never done   Pneumococcal  Vaccine: 50+ Years (1 of 1 - PCV) Never done   DTaP/Tdap/Td (2 - Tdap) 04/25/2016   Influenza Vaccine  08/30/2023   Medicare Annual Wellness (AWV)  12/09/2024   Fecal DNA (Cologuard)  06/30/2025   Hepatitis C Screening  Completed   Meningococcal B Vaccine  Aged Out   Colonoscopy  Discontinued        Assessment/Plan:  This is a routine wellness examination for Izeah.  Patient Care Team: Joyce Norleen BROCKS, MD as PCP - General (Family Medicine) Court Dorn PARAS, MD as PCP - Cardiology (Cardiology) Specialists, [facility]  I have personally reviewed and noted the following in the patient's chart:   Medical and social history Use of alcohol, tobacco or illicit drugs  Current medications and supplements including opioid prescriptions. Functional ability and status Nutritional status Physical activity Advanced directives List of other physicians Hospitalizations, surgeries, and ER visits in previous 12 months Vitals Screenings to include cognitive, depression, and falls Referrals and appointments  No orders of the defined types were placed in this encounter.  In addition, I have reviewed and discussed with patient certain preventive protocols, quality metrics, and best practice recommendations. A written personalized care plan for preventive services as well as general preventive health recommendations were provided to patient.   Ardella FORBES Dawn, LPN   [id number]/88/7974   Return in 1 year (on 12/09/2024).  After Visit Summary: (Pick Up) Due to this being a telephonic visit, with patients personalized plan was offered to patient and patient has requested to Pick up at office.  Nurse Notes: Declines vaccines

## 2023-12-29 ENCOUNTER — Other Ambulatory Visit: Payer: Self-pay | Admitting: Family Medicine

## 2023-12-29 DIAGNOSIS — E039 Hypothyroidism, unspecified: Secondary | ICD-10-CM

## 2023-12-29 DIAGNOSIS — E782 Mixed hyperlipidemia: Secondary | ICD-10-CM

## 2023-12-30 ENCOUNTER — Other Ambulatory Visit: Payer: Self-pay | Admitting: Family Medicine

## 2023-12-30 DIAGNOSIS — I428 Other cardiomyopathies: Secondary | ICD-10-CM

## 2023-12-30 DIAGNOSIS — K219 Gastro-esophageal reflux disease without esophagitis: Secondary | ICD-10-CM

## 2023-12-30 DIAGNOSIS — I10 Essential (primary) hypertension: Secondary | ICD-10-CM

## 2023-12-30 NOTE — Telephone Encounter (Signed)
 Called Pt to schedule missed Physical, LVM for Pt to call back.

## 2024-01-08 DIAGNOSIS — Z79891 Long term (current) use of opiate analgesic: Secondary | ICD-10-CM | POA: Diagnosis not present

## 2024-01-08 DIAGNOSIS — M25532 Pain in left wrist: Secondary | ICD-10-CM | POA: Diagnosis not present

## 2024-01-08 DIAGNOSIS — M47817 Spondylosis without myelopathy or radiculopathy, lumbosacral region: Secondary | ICD-10-CM | POA: Diagnosis not present

## 2024-01-08 DIAGNOSIS — G894 Chronic pain syndrome: Secondary | ICD-10-CM | POA: Diagnosis not present

## 2024-01-08 DIAGNOSIS — Z79899 Other long term (current) drug therapy: Secondary | ICD-10-CM | POA: Diagnosis not present

## 2024-01-08 DIAGNOSIS — M7918 Myalgia, other site: Secondary | ICD-10-CM | POA: Diagnosis not present

## 2024-01-08 DIAGNOSIS — G8929 Other chronic pain: Secondary | ICD-10-CM | POA: Diagnosis not present

## 2024-01-08 DIAGNOSIS — M25562 Pain in left knee: Secondary | ICD-10-CM | POA: Diagnosis not present

## 2024-01-08 DIAGNOSIS — M545 Low back pain, unspecified: Secondary | ICD-10-CM | POA: Diagnosis not present

## 2024-01-08 DIAGNOSIS — M47816 Spondylosis without myelopathy or radiculopathy, lumbar region: Secondary | ICD-10-CM | POA: Diagnosis not present

## 2024-01-08 DIAGNOSIS — M25561 Pain in right knee: Secondary | ICD-10-CM | POA: Diagnosis not present

## 2024-01-13 ENCOUNTER — Other Ambulatory Visit: Payer: Self-pay

## 2024-01-13 DIAGNOSIS — I4821 Permanent atrial fibrillation: Secondary | ICD-10-CM

## 2024-01-14 ENCOUNTER — Ambulatory Visit: Payer: Self-pay

## 2024-01-14 ENCOUNTER — Ambulatory Visit: Payer: Self-pay | Admitting: Cardiovascular Disease

## 2024-01-14 DIAGNOSIS — I4821 Permanent atrial fibrillation: Secondary | ICD-10-CM

## 2024-01-14 DIAGNOSIS — Z8673 Personal history of transient ischemic attack (TIA), and cerebral infarction without residual deficits: Secondary | ICD-10-CM

## 2024-01-14 DIAGNOSIS — Z7901 Long term (current) use of anticoagulants: Secondary | ICD-10-CM

## 2024-01-14 LAB — PROTIME-INR
INR: 2.3 — ABNORMAL HIGH (ref 0.9–1.2)
Prothrombin Time: 23.6 s — ABNORMAL HIGH (ref 9.1–12.0)

## 2024-01-14 NOTE — Progress Notes (Signed)
 Description   Spoke with wife, [name] and advised pt to continue taking warfarin 1 tablet (5 mg) daily except 1/2 tablet (2.5 mg) each Monday, Wednesday and Friday.  Repeat INR in 6 weeks at [facility] (has standing order), please release order. [phone]

## 2024-01-14 NOTE — Patient Instructions (Signed)
 Description   Spoke with wife, [name] and advised pt to continue taking warfarin 1 tablet (5 mg) daily except 1/2 tablet (2.5 mg) each Monday, Wednesday and Friday.  Repeat INR in 6 weeks at [facility] (has standing order), please release order. [phone]

## 2024-01-20 ENCOUNTER — Other Ambulatory Visit: Payer: Self-pay | Admitting: Family Medicine

## 2024-01-20 DIAGNOSIS — N529 Male erectile dysfunction, unspecified: Secondary | ICD-10-CM

## 2024-02-06 ENCOUNTER — Encounter: Payer: Self-pay | Admitting: Family Medicine

## 2024-02-06 ENCOUNTER — Encounter: Admitting: Family Medicine

## 2024-02-25 ENCOUNTER — Other Ambulatory Visit: Payer: Self-pay

## 2024-02-25 DIAGNOSIS — I4821 Permanent atrial fibrillation: Secondary | ICD-10-CM

## 2024-02-26 ENCOUNTER — Telehealth: Payer: Self-pay

## 2024-02-26 NOTE — Telephone Encounter (Signed)
 Lpmtcb and let us  know if lab drawn.

## 2024-02-27 ENCOUNTER — Other Ambulatory Visit: Payer: Self-pay | Admitting: Cardiovascular Disease

## 2024-02-27 LAB — PROTIME-INR: INR: 2.2 — AB (ref 0.80–1.20)

## 2024-02-28 ENCOUNTER — Ambulatory Visit (INDEPENDENT_AMBULATORY_CARE_PROVIDER_SITE_OTHER): Admitting: *Deleted

## 2024-02-28 ENCOUNTER — Telehealth: Payer: Self-pay | Admitting: *Deleted

## 2024-02-28 DIAGNOSIS — Z8673 Personal history of transient ischemic attack (TIA), and cerebral infarction without residual deficits: Secondary | ICD-10-CM | POA: Diagnosis not present

## 2024-02-28 DIAGNOSIS — Z7901 Long term (current) use of anticoagulants: Secondary | ICD-10-CM

## 2024-02-28 DIAGNOSIS — I4821 Permanent atrial fibrillation: Secondary | ICD-10-CM

## 2024-02-28 LAB — PROTIME-INR
INR: 2.2
Prothrombin Time: 24.5 s — ABNORMAL HIGH (ref 11.5–14.6)

## 2024-02-28 NOTE — Telephone Encounter (Signed)
 Spoke with wife and patient and they stated he went to [facility] on yesterday, 02/27/24 around 330p to have INR drawn.Will wait & follow up.

## 2024-02-28 NOTE — Patient Instructions (Signed)
 Description   INR-2.2; Spoke with wife, [name] and advised pt to continue taking warfarin 1 tablet (5mg ) daily except 1/2 tablet (2.5mg ) each Monday, Wednesday and Friday.  Repeat INR in 6 weeks at [facility] (has standing order), please release order. [phone]

## 2024-02-28 NOTE — Progress Notes (Signed)
" °  Description   INR-2.2; Spoke with wife, [name] and advised pt to continue taking warfarin 1 tablet (5mg ) daily except 1/2 tablet (2.5mg ) each Monday, Wednesday and Friday.  Repeat INR in 6 weeks at [facility] (has standing order), please release order. [phone]     "

## 2024-03-02 ENCOUNTER — Ambulatory Visit: Payer: Self-pay | Admitting: Cardiovascular Disease
